# Patient Record
Sex: Male | Born: 1950 | Race: Black or African American | Hispanic: No | Smoking: Former smoker
Health system: Southern US, Community
[De-identification: ages and names within clinical notes are randomized; demographics above are authoritative.]

## PROBLEM LIST (undated history)

## (undated) DIAGNOSIS — E7800 Pure hypercholesterolemia, unspecified: Secondary | ICD-10-CM

## (undated) DIAGNOSIS — M1990 Unspecified osteoarthritis, unspecified site: Secondary | ICD-10-CM

## (undated) DIAGNOSIS — L409 Psoriasis, unspecified: Secondary | ICD-10-CM

## (undated) DIAGNOSIS — I10 Essential (primary) hypertension: Secondary | ICD-10-CM

## (undated) DIAGNOSIS — E119 Type 2 diabetes mellitus without complications: Secondary | ICD-10-CM

## (undated) DIAGNOSIS — K219 Gastro-esophageal reflux disease without esophagitis: Secondary | ICD-10-CM

## (undated) HISTORY — DX: Unspecified osteoarthritis, unspecified site: M19.90

## (undated) HISTORY — DX: Type 2 diabetes mellitus without complications: E11.9

## (undated) HISTORY — DX: Pure hypercholesterolemia, unspecified: E78.00

## (undated) HISTORY — PX: KNEE SURGERY: SHX244

---

## 2003-10-08 ENCOUNTER — Ambulatory Visit: Admission: RE | Admit: 2003-10-08 | Payer: Self-pay | Source: Ambulatory Visit | Admitting: Gastroenterology

## 2006-07-18 ENCOUNTER — Ambulatory Visit: Admit: 2006-07-18 | Disposition: A | Discharge status: Home / Self Care | Payer: Self-pay | Source: Ambulatory Visit

## 2006-09-27 ENCOUNTER — Ambulatory Visit: Admit: 2006-09-27 | Disposition: A | Discharge status: Home / Self Care | Payer: Self-pay | Source: Ambulatory Visit

## 2007-09-28 ENCOUNTER — Inpatient Hospital Stay
Admission: EM | Admit: 2007-09-28 | Disposition: A | Discharge status: Skilled Nursing Facility | Payer: Self-pay | Source: Emergency Department | Admitting: Pulmonary Disease

## 2007-09-29 LAB — PT AND APTT
PT INR: 1.2 {INR} — ABNORMAL HIGH (ref 0.9–1.1)
PT: 13.9 s — ABNORMAL HIGH (ref 10.8–13.3)
PTT: 27 s (ref 21–32)

## 2007-09-29 LAB — BASIC METABOLIC PANEL
BUN: 17 mg/dL (ref 8–20)
CO2: 24 mEq/L (ref 21–30)
Calcium: 9.3 mg/dL (ref 8.6–10.2)
Chloride: 102 mEq/L (ref 98–107)
Creatinine: 1.2 mg/dL (ref 0.6–1.5)
Glucose: 103 mg/dL — ABNORMAL HIGH (ref 70–100)
Potassium: 4 mEq/L (ref 3.6–5.0)
Sodium: 137 mEq/L (ref 136–146)

## 2007-09-29 LAB — CBC
Hematocrit: 36.7 % — ABNORMAL LOW (ref 42.0–52.0)
Hgb: 12.4 G/DL — ABNORMAL LOW (ref 13.0–17.0)
MCH: 30 PG (ref 28.0–32.0)
MCHC: 33.8 G/DL (ref 32.0–36.0)
MCV: 88.6 FL (ref 80.0–100.0)
MPV: 10.2 FL (ref 9.4–12.3)
Platelets: 238 /mm3 (ref 140–400)
RBC: 4.14 /mm3 — ABNORMAL LOW (ref 4.70–6.00)
RDW: 14.2 % (ref 11.5–15.0)
WBC: 9.23 /mm3 (ref 3.50–10.80)

## 2007-09-29 LAB — GFR

## 2007-09-29 LAB — MAGNESIUM: Magnesium: 1.7 mg/dL (ref 1.6–2.3)

## 2007-09-29 LAB — GLYCOHEMOGLOBIN A1C* CERNER: % Hgb A1C: 6.2 % — ABNORMAL HIGH (ref 4.8–6.0)

## 2007-09-30 LAB — CBC
Hematocrit: 35.9 % — ABNORMAL LOW (ref 42.0–52.0)
Hgb: 11.9 G/DL — ABNORMAL LOW (ref 13.0–17.0)
MCH: 30 PG (ref 28.0–32.0)
MCHC: 33.1 G/DL (ref 32.0–36.0)
MCV: 90.4 FL (ref 80.0–100.0)
MPV: 10.5 FL (ref 9.4–12.3)
Platelets: 236 /mm3 (ref 140–400)
RBC: 3.97 /mm3 — ABNORMAL LOW (ref 4.70–6.00)
RDW: 14.5 % (ref 11.5–15.0)
WBC: 14.16 /mm3 — ABNORMAL HIGH (ref 3.50–10.80)

## 2007-09-30 LAB — GFR

## 2007-09-30 LAB — MAGNESIUM: Magnesium: 1.7 mg/dL (ref 1.6–2.3)

## 2007-09-30 LAB — BASIC METABOLIC PANEL
BUN: 13 mg/dL (ref 8–20)
CO2: 25 mEq/L (ref 21–30)
Calcium: 8.5 mg/dL — ABNORMAL LOW (ref 8.6–10.2)
Chloride: 99 mEq/L (ref 98–107)
Creatinine: 1.2 mg/dL (ref 0.6–1.5)
Glucose: 104 mg/dL — ABNORMAL HIGH (ref 70–100)
Potassium: 4.2 mEq/L (ref 3.6–5.0)
Sodium: 137 mEq/L (ref 136–146)

## 2007-10-01 LAB — BASIC METABOLIC PANEL
BUN: 20 mg/dL (ref 8–20)
CO2: 27 mEq/L (ref 21–30)
Calcium: 8.4 mg/dL — ABNORMAL LOW (ref 8.6–10.2)
Chloride: 98 mEq/L (ref 98–107)
Creatinine: 1.5 mg/dL (ref 0.6–1.5)
Glucose: 92 mg/dL (ref 70–100)
Potassium: 4.5 mEq/L (ref 3.6–5.0)
Sodium: 134 mEq/L — ABNORMAL LOW (ref 136–146)

## 2007-10-01 LAB — CBC
Hematocrit: 32.7 % — ABNORMAL LOW (ref 42.0–52.0)
Hgb: 10.6 G/DL — ABNORMAL LOW (ref 13.0–17.0)
MCH: 29.7 PG (ref 28.0–32.0)
MCHC: 32.4 G/DL (ref 32.0–36.0)
MCV: 91.6 FL (ref 80.0–100.0)
MPV: 10.3 FL (ref 9.4–12.3)
Platelets: 218 /mm3 (ref 140–400)
RBC: 3.57 /mm3 — ABNORMAL LOW (ref 4.70–6.00)
RDW: 14.6 % (ref 11.5–15.0)
WBC: 11.22 /mm3 — ABNORMAL HIGH (ref 3.50–10.80)

## 2007-10-01 LAB — URINALYSIS
Bilirubin, UA: NEGATIVE
Glucose, UA: NEGATIVE
Ketones UA: NEGATIVE
Leukocyte Esterase, UA: NEGATIVE
Nitrite, UA: NEGATIVE
Protein, UR: NEGATIVE
Specific Gravity UA POCT: 1.019 (ref 1.001–1.035)
Urine pH: 5.5 (ref 5.0–8.0)
Urobilinogen, UA: 0.2

## 2007-10-01 LAB — GFR

## 2007-10-01 LAB — MAGNESIUM: Magnesium: 1.9 mg/dL (ref 1.6–2.3)

## 2007-10-02 LAB — BASIC METABOLIC PANEL
BUN: 16 mg/dL (ref 8–20)
CO2: 28 mEq/L (ref 21–30)
Calcium: 8.6 mg/dL (ref 8.6–10.2)
Chloride: 100 mEq/L (ref 98–107)
Creatinine: 1.3 mg/dL (ref 0.6–1.5)
Glucose: 97 mg/dL (ref 70–100)
Potassium: 4.2 mEq/L (ref 3.6–5.0)
Sodium: 137 mEq/L (ref 136–146)

## 2007-10-02 LAB — CBC
Hematocrit: 31.1 % — ABNORMAL LOW (ref 42.0–52.0)
Hgb: 10.3 G/DL — ABNORMAL LOW (ref 13.0–17.0)
MCH: 30 PG (ref 28.0–32.0)
MCHC: 33.1 G/DL (ref 32.0–36.0)
MCV: 90.7 FL (ref 80.0–100.0)
MPV: 10.6 FL (ref 9.4–12.3)
Platelets: 225 /mm3 (ref 140–400)
RBC: 3.43 /mm3 — ABNORMAL LOW (ref 4.70–6.00)
RDW: 14.6 % (ref 11.5–15.0)
WBC: 9.77 /mm3 (ref 3.50–10.80)

## 2007-10-02 LAB — MAGNESIUM: Magnesium: 2 mg/dL (ref 1.6–2.3)

## 2011-06-08 LAB — ECG 12-LEAD
Atrial Rate: 62 {beats}/min
P Axis: 35 degrees
P-R Interval: 194 ms
Q-T Interval: 418 ms
QRS Duration: 102 ms
QTC Calculation (Bezet): 424 ms
R Axis: -18 degrees
T Axis: -3 degrees
Ventricular Rate: 62 {beats}/min

## 2011-06-29 NOTE — Consults (Unsigned)
DATE OF BIRTH:                        [birth year]      ADMISSION DATE:                     09/28/2007            PATIENT LOCATION:                    [id number]            DATE OF CONSULTATION:                09/29/2007      CONSULTANT:                        Abe People, MD      CONSULTING SERVICE:            REASON FOR CONSULTATION:   Bilateral knee injury with a possible quadriceps      tendon tear.            HISTORY OF PRESENT ILLNESS:  The patient is a 60 year old black gentleman,      who has a history of hypertension and hypercholesterolemia.  The patient      was playing basketball yesterday.  The patient jumped and went down.  He      had no pain on landing, but when he started to run, he heard a large pop on      the left knee.  The left knee gave out.  He fell down.  He tried to use the      right side, and tried to put weight on the right side.  Again, he started      to have a lot of pain, and the right knee gave out again.  The patient was      brought to the emergency room in [facility].  With x-rays done,      the patient was admitted to the hospital for possible rupture of bilateral      quadriceps tendons.            I was consulted to see this patient for evaluation.  The patient complained      of pain on the left side much more than the right side.            PAST MEDICAL HISTORY:  His past medical history is significant for      hypertension, hypercholesterolemia, diabetes with non-insulin dependence,      benign prostatic hypertrophy, allergic rhinitis.            ALLERGIES:  The patient is not allergic to medications.            MEDICATIONS:  Aspirin; Benicar; Lipitor; Zetia; Uroxatral; Allegra.            FAMILY HISTORY:  Noncontributory.            SOCIAL HISTORY:  The patient denied any smoking.  He drinks occasionally.      He denied IV drug abuse.  The patient is living at home by himself.            REVIEW OF SYSTEMS:  Essentially unremarkable, except the bilateral knees       were painful and swollen.            PHYSICAL  EXAMINATION:  The patient is alert, oriented x3, and cooperative.      Vital signs stable.  No distress.  The left knee exam showed the patient      had swelling.  Unable to hold the knee straight with extension.  The      patient had a small palpable gap at the insertion of the quadriceps tendon      on the superior pole of the patella and severe pain with movement of the      knee.  The patient had some pain on palpation around the medial and lateral      knee, but grossly stable.  Neurovascularly intact.  Exam of the right knee      showed the patient has mild swelling compared to the other side.  Again,      the patient had a small palpable gap on the suprapatellar area with unable      to hold the knee straight actively.  Painful palpation at the quadriceps      tendon insertion, neurovascularly intact.  The rest of the examination was      essentially unremarkable.  No calf pain.            LABORATORY DATA:  X-ray of the left knee showed the patient had a small      avulsion fracture of the superior pole of the patella.  No obvious other      fractures.  X-ray of the right essentially negative.            IMPRESSION:  Quadriceps tendon tear of bilateral knees, possibly complete      on the left side and partial on the right side.            RECOMMENDATIONS:  The patient had the MRI today, and I recommend the      patient to have the surgery of the repair of the quadriceps tendon tear of      the bilateral knees.  The risks and benefits of the operation were      explained to the patient.  The patient agreed to have the surgery.                                                ___________________________________          Date Signed: __________      Abe People, MD  ([id number])            D: 09/29/2007 by Abe People, MD      T: 09/29/2007 by [id number] ([id number]) Dorris Carnes: [id number])      cc:  Abe People, MD

## 2011-06-29 NOTE — Discharge Summary (Signed)
DATE OF BIRTH:                        [birth year]            ADMISSION DATE:                     09/28/2007      DISCHARGE DATE:                     10/02/2007            ATTENDING PHYSICIAN:                  Tammi Klippel, MD            HISTORY OF PRESENT ILLNESS:  This is a 60 year old man with past medical      history of hypertension, diabetes, status post fall.  Had an injury of      bilateral knees while playing basketball and sustained a rupture of      bilateral quadriceps tendon with a patellar fracture and got admitted on      09/28/2007.            HOSPITAL COURSE:  Patient was seen by the orthopedic on 09/29/2007 and      underwent surgery on 09/29/2007 with quadriceps tendon repair of the      bilateral knees.  Patient was getting medication for the pain and Lovenox      for prophylaxis and continued all the home medication.  Patient continued      to improve with the pain, and PCA pump was stopped on 10/02/2007, and      Percocet p.r.n. for the pain.  Patient was consulted by physical      therapy/occupational therapy, and plan was to discharge the patient to      acute rehabilitation on 10/02/2007.  Patient was discharged to the      rehabilitation.            DISCHARGE MEDICATIONS:  Plan was to continue Lovenox 40 mg subcutaneously      daily for two weeks and then take aspirin 81 mg once a day.  Plan was to      transfer to the rehabilitation with incentive spirometer.  Norvasc 2.5 mg      q.12 h., Lipitor 20 mg nightly, Zetia 10 mg nightly, Zyrtec 10 mg every      day, Colace p.r.n. for the stool softener, folate 1 mg once a day, Cozaar      25 mg once a day, Januvia 50 mg once a day, Flomax 0.4 mg once a day,      Restoril 15 mg nightly, Percocet p.r.n. for the pain.            DISPOSITION:  Plan was for subacute rehabilitation on 10/02/2007.                                          Electronic Signing MD: Tammi Klippel, MD  ([id number])            D: 10/21/2007 by Tammi Klippel, MD       T: 10/22/2007 by [id number] ([id number]) (Dorris Carnes: [id number])      cc:  Tammi Klippel, MD

## 2011-06-29 NOTE — H&P (Unsigned)
DATE OF BIRTH:                        June 08, 1951      ADMISSION DATE:                     09/28/2007            PATIENT LOCATION:                    [id number] 6[address] [id number]            ATTENDING PHYSICIAN:                  Susanne Borders, MD            CHIEF COMPLAINT:      1.   Pain in the legs.      2.   Multiple trauma.            HISTORY OF PRESENT ILLNESS:  This is a 60 year old African American male      with a history of hypertension.  She basically comes here with a history of      fall while he was playing the basketball.  He jumped and went up for a      jump, and had no pain in landing, but noted pain in the left knee.  Shortly      afterwards, he put his pressure on the right knee, and then again he      started to have pain.  That is the reason why he is here for further      evaluation and management.  The patient denies any history of chest pain,      shortness of breath.            No chills.  No rigors.  No palpitations.            PAST MEDICAL HISTORY:      1.   Hypertension.      2.   Hypercholesterolemia.      3.   Diabetes mellitus.      4.   Benign prostatic hypertrophy.      5.   Allergic rhinitis.            ALLERGIES:  None.            MEDICATIONS:      1.   Aspirin 1 tablet p.o. daily.      2.   Benicar 1 tablet p.o. daily.      3.   Lipitor 1 tablet p.o. daily.      4.   Zetia 1 tablet p.o. daily.      5.   _____ 1 tablet p.o. daily.      6.   Uroxatrol 1 tablet p.o. daily.      7.   Allegra 1 tablet p.o. q.12 h.            FAMILY HISTORY:  Noncontributory.            SOCIAL HISTORY:  The patient does not smoke.  He drinks about 2, 3, or 4      drinks per day.  No history of IV drug abuse.            REVIEW OF SYSTEMS:  HEENT:  No history of headache.  No history of ear,      nose, or throat problem.  No history  of thyroid problem.  Heart:  No      history of chest pain or heart failure.  No MI.  Lungs:  He used to smoke.      He stopped smoking for the last 10 years.  He does not smoke  anymore.      Endocrine:  History of diabetes and hypertension and hypercholesterolemia,      for which he is taking medication.  Neurologic:  No history of stroke.      Genitourinary:  The patient has a history of difficulty in passing urine.      Sometimes he uses Uroxatral so he can make it better.            PHYSICAL EXAMINATION:  General Appearance:  This is a 60 year old male not      in acute respiratory distress, cooperative with physical examination.      Vital Signs:  Temperature 96.8, pulse of 62 per minute, respiratory rate of      18 per minute, blood pressure of 157/89.  HEENT:  Normocephalic,      atraumatic.  Pupils are equal and reactive to light and accommodation.      Heart:  S1 and S2 normal.  Regular rate and rhythm.  No murmurs.  No      bruits.  Lungs:  Bilaterally good breath sounds.  No rales.  No rhonchi.      No wheezing.  No egophony.   Abdomen:  Soft, nontender.  Bowel sounds are      present.  Musculoskeletal:  Significant swelling of both knees, but      basically the patella has moved upwards with significant swelling and      significant tenderness there.  There is a significant limitation in the      movement of the patella in the knee joint.            INVESTIGATIONS:  No investigations when he presented to the emergency room.      They did no investigations.            X-ray of the knee shows findings consistent with avulsion fracture from the      superior pole each patella, probably involving calcifications at the distal      quadriceps tendon.            ASSESSMENT:      1.   Fall, injury.      2.   Patellar avulsion fracture.      3.   Hypertension.      4.   Diabetes mellitus.      5.   History of allergic rhinitis.      6.   Allergy to none.      7.   Hyperlipidemia.      8. Benign prostatic hypertrophy.      MANAGEMENT:      1.   Will request Dr. Laveda Norman to see the patient, who is an orthopedic      surgeon.      2.   Will also request EKG with 12 leads to make sure he is  okay.      3.   Will request Dr. Westley Chandler to see the patient.      4.   Make sure neurologic checks are done q.8 h.  Watch for the tremors.      5.   Start the patient on Januvia.      6.   Hold  Januvia in the morning.      7.   Allegra 60 mg p.o. q.8 h.      8.   EKG with 12 leads.      9.   _____ on a p.r.n. basis.      10.  Tylenol p.r.n.      11.  Folic acid 1 tablet p.o. daily.      12.  Thiamine 100 mg IV daily.      13.  Restoril 15 mg p.o. daily.      14.  Norvasc and Benicar to continue.      15.  Zetia and Lipitor to continue.      16.  Nothing by mouth at midnight.      17.  Will get CBC, electrolytes, and repeat INR again.      18.  Will follow the patient closely.                              ___________________________________          Date Signed: __________      Susanne Borders, MD  ([id number])            D: 09/28/2007 by Susanne Borders, MD      T: 09/28/2007 by [id number] ([id number]) (N: [id number])      cc:  Susanne Borders, MD

## 2011-06-29 NOTE — Consults (Signed)
DATE OF BIRTH:                        [birth year]      ADMISSION DATE:                     09/28/2007            PATIENT LOCATION:                    [facility] 678 01            DATE OF CONSULTATION:                09/29/2007      CONSULTANT:                        Annetta Maw, MD      CONSULTING SERVICE:                  CARDIOLOGY            REFERRING PHYSICIAN:                  [place] Crews, MD            REASON FOR CONSULTATION:    Preoperative evaluation.            HISTORY OF PRESENT ILLNESS:    The patient is a 60 year old gentleman who      comes to the hospital with bilateral knee pain.  He says that he was      playing basketball when he suddenly felt a popping sensation in the left      knee and subsequently the same symptoms in the right knee, after he shifted      his weight to the right.  He is now being evaluated by Cardiology for      cardiac clearance prior to the surgery.  He says that he is fairly active      and plays single tennis regularly without experiencing any symptoms.      Furthermore he plays basketball and chases the ball without experiencing      any chest pain or shortness of breath.  He played last yesterday when his      knee gave away during the game.  Currently he is asymptomatic and feels      fine.  There is no history of chest pain, no shortness of breath, no      palpitation, lightheadedness, dizziness or syncope.  Normally he is able to      walk up two flights of stairs carrying the groceries without any      difficulty.            PAST MEDICAL HISTORY:    Significant for hypertension, noninsulin dependent      diabetes, hyperlipidemia.            PAST SURGICAL HISTORY:    Significant for open reduction and fixation of      the left foot.            HOME MEDICATIONS:  Januvia, Lipitor, Zetia, Benicar, Norvasc, tetracycline,      aspirin and prostate medication.            ALLERGIES:    No known drug allergies.            SOCIAL HISTORY:    He used to smoke about a  pack per day for more than 30      years but stopped 8 years ago.  He drinks a couple of beers every other day      or so.  He lives at home.            FAMILY HISTORY:    There is no history of coronary artery disease or sudden      cardiac death in the first degree  family members.            REVIEW OF SYSTEMS:    There is no history of congestion, runny nose or      eyes, no fever or chills, no cough, no skin rash.  Otherwise as mentioned      above.            PHYSICAL EXAMINATION:    He is awake, alert, oriented, in no apparent      distress.  His blood pressure today is 127/70, heart rate is 60 bpm,      respirations 18 and he is afebrile.  Head and neck examination:  No jugular      venous distention, no jaundice, no lymphadenopathy, no bruits over      carotids, moist mucous membranes.  Cardiovascular examination is intact      first and second heart sounds, no third or fourth heart sound, no murmurs,      rubs or heaves.  Pulmonary examination reveals normal breath sounds with no      wheeze, crackle or rhonchi.  The abdomen is soft, nontender, with no      organomegaly.  Lower extremities with no edema, with 2+ pulses.            LAB DATA:    Normal CBC and chemistry panel.  His glucose is mildly      elevated at 102.  PT/PTT are within normal limits.  His electrocardiogram      shows sinus rhythm at a rate of 62 bpm, with normal QRS morphology and no      significant CT abnormality.            ASSESSMENT AND PLAN:    Mr. Beckel is a 60 year old gentleman with past      medical history as mentioned above, who is admitted to the hospital for      knee surgery.  He is fairly active and exercises regularly without any      symptoms.  His activity level is clearly more than 4 mets.  His      electrocardiogram is also normal.  Therefore I find him to be at low risk      from a cardiac point of view to undergo his knee surgery.            I would like to thank Dr. Valentina Shaggy for allowing me to participate in the       evaluation and care of Mr. Vanwagoner.                        Electronic Signing MD: Annetta Maw, MD  ([id number])            D: 09/29/2007 by Annetta Maw, MD      T: 09/29/2007 by Dot Been ([id number]) (N: [id number])      cc:  Susanne Borders, MD          Annetta Maw, MD

## 2011-06-29 NOTE — Op Note (Unsigned)
DATE OF BIRTH:                        06/22/1951      ADMISSION DATE:                     09/28/2007            PATIENT LOCATION:                    [facility] 680 01            DATE OF PROCEDURE:                   09/29/2007      SURGEON:                            Abe People, MD      ASSISTANT(S):                         Henrine Screws                  PREOPERATIVE DIAGNOSIS:                 RUPTURE OF THE QUADRICEPS                                              TENDON,BILATERAL KNEES.            POSTOPERATIVE DIAGNOSIS:                RUPTURE OF THE QUADRICEPS TENDON,                                              BILATERAL KNEES.            OPERATIVE PROCEDURE:                    OPEN REPAIR OF THE RUPTURED                                              QUADRICEPS TENDON, BILATERAL KNEES.                  ANESTHESIA:                             General endotracheal tube                                              anesthesia.            ESTIMATED BLOOD LOSS:                   Minimal.            TOURNIQUET TIME:  Tourniquet time on the left side is                                              54 minutes, on the right side 45                                              minutes.            INDICATION:  The patient is a  60 year old black gentleman with injury of      the bilateral knees when he was playing basketball yesterday sustaining      rupture of the bilateral quadriceps tendon which is confirmed with MRI      (magnetic resonance imaging) scan.  The patient was recommended to have the      surgery as mentioned above.            DESCRIPTION OF PROCEDURE:   After preoperative consent was obtained, the      patient was brought to the operating room and placed in position.  The      patient received 2 g Ancef for infection prophylaxis.  The bilateral knees      were then prepped and draped in the usual sterile fashion.  We did the left      knee first.  The tourniquet was inflated to 350 mmHg on the left  thigh.      The incision about 8 cm was made on the anterior aspect of the knee right      to the distal aspect of the quadriceps and the patella bone.  The incision      was then carried down through the subcutaneous tissues.  The hematoma was      evacuated.  The patient had completely torn off the quadriceps tendon.  I      washed the wound carefully and then I used #5 Ethibond to repair the      quadriceps and then with that formed the distal aspect of the quadriceps      tendon in the midline and then going proximal another 3 cm and then going      back to the distal aspect.  We used the Krackow suture and locking suture      of #5 Ethibond.  We did the same thing on both mediolateral sides with four      strings total.  We then drilled three holes on the superior to inferior      pole of the patella and sutured with #5 Ethibond, taken through the hole,      and then tied distally.  The knot was tied when the knee was held in      extension at 0 degrees.  I also used #5 Ethibond as well as #2 Ethibond to      close both medial and lateral retinaculum.  The patient had excellent      repair.  We washed the wound carefully.  We closed the wound by using #2      Monocryl and a stapler.  Hemostasis was obtained after the release the      tourniquet showing 54 minutes.  We placed a dressing.  I injected the  patient with 10 cc Marcaine along the skin incision and 10 cc of Marcaine      inside the knee joint on the left side.            Attention was now directed to the right side.  The incision was made about      8 cm on the midline of the knee to the distal quadriceps going to the      anterior patella.  The incision was then carried to the soft tissue      exposing the quadriceps tendon and the patella.  Again, we used #5 Ethibond      to suture.  We started from the distal aspect and went to proximal and went      back to distally.  We used a Krackow suture with locking suture and we had      four strands  coming out from the distal end of the quadriceps tendon.  We      then drilled three holes, one in the middle and one for each mediolateral      side of the superior pole patella to the inferior pole.  The sutures were      then pulled out from proximal to distal aspect of the patella and were tied      up as the left side.  Then we used #5 Ethibond as well as #2 Ethibond to      close both medial and lateral retinaculum.  The tourniquet was released at      45 minutes on the right side.  Hemostasis was obtained.  I closed the wound      by using 3-0 Monocryl and stapler.  Dry dressing was put in place.  I put      the patient on the knee immobilizer with knee placed and locked at 0      degrees extension.  I injected the patient with 10 cc of Marcaine 0.5%      along the skin incision and 10 cc in the knee joint.  The patient was      extubated an awakened and transferred to the post anesthesia care unit      without complication.                        ___________________________________          Date Signed: __________      Abe People, MD  ([id number])            D: 09/29/2007 by Abe People, MD      T: 09/30/2007 by Jonathon Bellows ([id number]) ([id number])      cc:  Abe People, MD

## 2012-07-29 ENCOUNTER — Ambulatory Visit: Payer: Self-pay | Admitting: Rehabilitative and Restorative Service Providers"

## 2012-07-31 ENCOUNTER — Ambulatory Visit: Payer: Self-pay | Admitting: Rehabilitative and Restorative Service Providers"

## 2012-08-04 ENCOUNTER — Inpatient Hospital Stay: Payer: 59 | Attending: Orthopaedic Surgery | Admitting: Rehabilitative and Restorative Service Providers"

## 2012-08-04 ENCOUNTER — Encounter: Payer: Self-pay | Admitting: Rehabilitative and Restorative Service Providers"

## 2012-08-04 VITALS — BP 128/66 | HR 66

## 2012-08-04 DIAGNOSIS — M47817 Spondylosis without myelopathy or radiculopathy, lumbosacral region: Secondary | ICD-10-CM | POA: Insufficient documentation

## 2012-08-04 NOTE — PT/OT Therapy Note (Signed)
INITIAL EVALUATION (Lumbar)      Name: Neil Frederick Age: 61 y.o. Occupation: desk job SOC: 08/04/2012  Referring Physician: Abe People, MD MD recheck-:  DOS: - DOI: Onset of Problem / Injury: 06/30/12  Diagnosis (Treating/Medical): The encounter diagnosis was Lumbosacral spondylosis without myelopathy.   # of Authorized Visits:  5 Visit #  1 today     SUBJECTIVE:    Mechanism of Injury: 5 weeks a go did  A lot of lifting and played basket ball , the day after pain started in the R leg, and also had tingling in his leg that turned into numbness, went to doctor who ordered MRI and ordered medication, pain went away 3 weeks  Ago,  but still has numbness in the R leg all the way down to 4 th and 5th toes, MRI showed spur formation at L5      Patient's reason for seeking PT /Goal: decreasing numbness    Past Medical History:   Past Medical History   Diagnosis Date   . Diabetes mellitus without complication      Medications: No outpatient prescriptions have been marked as taking for the 08/04/12 encounter (Clinical Support) with Derrek Gu, PT.   Fall Risks: Low    Other Treatment/Prior Therapy: Yes for knees and shoulder  Prior Hospitalization:   Hand Dominance: Dominant Hand: Right Involved Side: Involved Side: Right   DiagnosticTests: MRI L bone spure in L5    Outcome Measure:         Oswestry Score: 0          % PSFS Score: 0 % Rate Satisfaction with Current Function: 7/10  PLOF: no limitation in any of the movements  Living Environment: Type of Residence: Multi-story home      Dwelling Entrance: # Steps to Enter: 26    Patient lives with: Living Arrangements: Alone    OBJECTIVE:    Vitals: BP: 128/66 mmHg Heart Rate: 66         Observation/Posture/Gait/Integumentary  Observation:   Posture: Deficits noted: Forward Head, Pes Planus bilateral and Other: flexed knees  Gait: Other: lateral body shifting, decrease transverse motion of the pelvis  Integumentary: No wound, lesion or rash noted     AROM Hip  WFL    AROM: Lumbar Spine Pain  R L   Flexion 25    Flexion     Extension 5    Extension      R L R L Abduction     Side Bending 10 15   Exter. Rot.     Rotation     Inter. Rot.     (blank fields were intentionally left blank)    Repeated flexion/extension centralizes symptoms? NT    STRENGTH   Lumbar   MMT /5    IE R IE L R L  IE R IE  L R  L   L1/2 Hip Flex 4+ 4   Glute Medius 4+ 4+     L3 Knee Ext 5 5    T.A. Activation fair fair     L4 Ankle DF 5 5   Heel/Toe Walk       L5 Toe Ex 4 4   multifidus fair fair          Hip IR 4+ 4+          Hip ER 4+ 4+     S1 Knee Flex 5 5   Hip extension 4 4     (  blank fields were intentionally left blank)    Palpation: No pain to palpation    Joint Mobility Assessment: hypomobile  End feel: Firm    Special Tests/Neurological Screen:   R L   Deep Tendon Reflexes: Patella 2+ = Normal 2+ = Normal    Achilles 1+ = Trace 2+ = Normal    Post Tib NT NT     SI Special Tests:    R L   Compression (-) (-)   Distraction (-) (-)   March (+) (-)   Sacral thrust NT NT   Sitting PSIS heights NT NT   Standing Flexion NT NT   Supine long sit NT NT   Thigh thrust NT NT           R L   SLR (-) (-)   Crossed SLR NT NT   Quadrant Test NT NT   Thomas Test NT NT   OBER Test (+) (+)   Slump (-) (-)   FABER (-) (-)   SAG Sign NT NT   Hamstring Flexibility 50 50   Piriformis flexibility 15 20   Iliopsoas flexibility 10 10               Treatment Initial Visit:  Evaluation  Patient Education on plan of treatment  Therapeutic exercise with instruction in HEP and provided patient written and illustrated handout No  Manual   Modalities: None  Barriers to rehabilitation: None  Rehab Potential:good  Is patient aware of diagnosis: Yes  For Next Visit Add     Derrek Gu, PT, [place] [address]  08/04/2012  Time In/Out:  5:50 - 6:30   Total Treatment Time:  40 min  Plan of Care / Updated Plan of Care IPTC Medicare Provider #: [phone]                    Patient Name: Neil Frederick  MRN: [id number]  DOI: Onset of Problem /  Injury: 06/30/12 DOS: N/A SOC: 08/04/2012         Diagnosis: The encounter diagnosis was Lumbosacral spondylosis without myelopathy.  ICD-9 code: 721.3    ASSESSMENT: the patient is a 61 y.o. male who requires Physical Therapy for the following:  Impairments: weakness of core and hip muscles, decreased mobility of the lumbar spine and thoracic spine, decreased ROM of the lumbar spine, tightness of the LE muscles    Functional Limitations:  Pt has no functional limitaion    Plan Of Care: Body Mechanics Education, NMR, Proprioceptive Activites, Instruction in HEP, Traction, Therapeutic Exercise and Soft Tissue/Joint Mobilization grade lll to V to LS    Frequency/Duration: 1 times a week for 5 weeks. Anticipated D/C date: 09/08/12    Goals:  Date (Body Area, Impairment Goal, Functional   Activity, Target Performance) Time Frame Status Date/  Initial   08/04/2012   Pt to show good mechanics in doing his HEP  5 weeks  Initial Eval                                  Signature: Derrek Gu, PT, [place] [address] Date: 08/04/2012    Signature: Abe People, MD ____________________________ Date:

## 2012-08-04 NOTE — PT/OT Exercise Plan (Signed)
Name: Neil Frederick  Referring Physician: Abe People, MD  Diagnosis:   Encounter Diagnosis:numbness radiating to R leg   Name Primary?   . Lumbosacral spondylosis without myelopathy Yes        Precautions: knee surgery Date of Surgery:  - MD Follow-up: -   ICD-9 : 721.3          Exercise Flow Sheet    Exercise Specifics  Date            TM                  bridging                     clams With TB              Prone push ups                 Prone alternate arm/leg                 Self traction                 modified side plank                 PD march                                                                    Home Exercise Program                 (Initials = supervised exercise by clinician)

## 2012-08-08 ENCOUNTER — Ambulatory Visit: Payer: Self-pay | Admitting: Rehabilitative and Restorative Service Providers"

## 2012-08-11 ENCOUNTER — Ambulatory Visit: Payer: 59

## 2012-08-14 ENCOUNTER — Inpatient Hospital Stay: Payer: 59 | Attending: Orthopaedic Surgery | Admitting: Rehabilitative and Restorative Service Providers"

## 2012-08-14 DIAGNOSIS — M47817 Spondylosis without myelopathy or radiculopathy, lumbosacral region: Secondary | ICD-10-CM | POA: Insufficient documentation

## 2012-08-14 NOTE — PT/OT Therapy Note (Signed)
DAILY NOTE    Time In/Out: 5:48 - 6:30  Total Time: 42 min Visit Number:  2    # of Authorized Visits: 5 Visit #: 2    Diagnosis (Treating/Medical): The encounter diagnosis was Lumbosacral spondylosis without myelopathy.          Subjective:    Functional Changes: pt reports that still has numbness in his R leg but better than before, pt has done his normal activities of daily living without problem    Objective:   Treatment:  Therapeutic Exercise:Per flowsheet to improve: Stabilization and Strength   Assisted Warm-up subjective taken  Modifications/Patient Education:  (Verbal, Visual and Tactile cues ) starting new exercises, added HEP    Functional Activities: identifying of core muscles and how to contract them individually , setting core muscles before different activities    Manual Therapy:   none      Current Measurements (ROM, Strength, Girth, Outcomes, etc.):      No measurement ( 2nd session)    Modalities: None  Therapy Rationale: Other:   Other:        Assessment (response to treatment):   Pt has a challenge to relax his back muscles in prone push ups, had no increase in his symptoms with exercises, shows considerable body shifting in pull down march    Progress towards functional goals: -    Patient requires continued skilled care to: shows consistency in HEP    Plan:  Adjustments/Modifications: if pt shows with less symptoms to be discharged on session 3, mechanics of lifting and sit to stand to be discussed next session    Derrek Gu, PT, [place] [address]  08/14/2012  Goals:   Date  (Body Area, Impairment Goal, Functional   Activity, Target Performance)  Time Frame  Status  Date/   Initial    08/04/2012  Pt to show good mechanics in doing his HEP  5 weeks  Initial Eval

## 2012-08-14 NOTE — PT/OT Exercise Plan (Signed)
Name: Neil Frederick  Referring Physician: Abe People, MD  Diagnosis:   Encounter Diagnosis:numbness radiating to R leg   Name Primary?   . Lumbosacral spondylosis without myelopathy Yes        Precautions: knee surgery Date of Surgery:  - MD Follow-up: -   ICD-9 : 721.3          Exercise Flow Sheet    Exercise Specifics 08/14/12 Date            TM    6' 2.0  AZ              bridging      2 x 10  AZ               clams With TB 2 x 10 RTB  AZ             Prone push ups    10 x 10"  AZ             Prone alternate arm/leg    X 10 B  AZ             Self traction    With half roll foam 3 x 30"             modified side plank    1x 10 B  AZ             PD march    2 min RTB  AZ                                                                Home Exercise Program                 (Initials = supervised exercise by clinician)

## 2012-08-15 ENCOUNTER — Inpatient Hospital Stay: Payer: 59 | Attending: Orthopaedic Surgery | Admitting: Rehabilitative and Restorative Service Providers"

## 2012-08-15 DIAGNOSIS — M47817 Spondylosis without myelopathy or radiculopathy, lumbosacral region: Secondary | ICD-10-CM | POA: Insufficient documentation

## 2012-08-15 NOTE — PT/OT Exercise Plan (Signed)
Name: Dorrene German  Referring Physician: Abe People, MD  Diagnosis:   Encounter Diagnosis:numbness radiating to R leg   Name Primary?   . Lumbosacral spondylosis without myelopathy Yes        Precautions: knee surgery Date of Surgery:  - MD Follow-up: -   ICD-9 : 721.3          Exercise Flow Sheet    Exercise Specifics 08/14/12 08/15/12            TM    6' 2.0  AZ 4'  1.45mph  ktw             bridging      2 x 10  AZ 3x10  With vmo  ktw              clams With TB 2 x 10 RTB  AZ RTB  3x10  ktw            Prone push ups    10 x 10"  AZ 10x10"  ktw            Prone alternate arm/leg    X 10 B  AZ x15  ktw            Self traction    With half roll foam 3 x 30" time            modified side plank    1x 10 B  AZ x15  2  Sec hold B  ktw            PD march    2 min RTB  AZ RTB  2'  ktw                                                               Home Exercise Program     understands            (Initials = supervised exercise by clinician)

## 2012-08-15 NOTE — PT/OT Therapy Note (Signed)
DAILY NOTE    Time In/Out: 2:45 - 3:30  Total Time: 45 min Visit Number:  3    # of Authorized Visits: 5 Visit #: 3    Diagnosis (Treating/Medical): There were no encounter diagnoses.          Subjective:  Reports right LE numbness from lateral thigh to outer foot from 90% to 1% since starting anti-inflammatory and PT treatments. Encouraged with ability to play tennis or basketball 3x/wk without aggravation.  Functional Changes: pt reports that still has numbness in his R leg but better than before, pt has done his normal activities of daily living without problem    Objective:   Treatment:  Therapeutic Exercise:Per flowsheet to improve: Stabilization and Strength   Assisted Warm-up TM with history taken.  Modifications/Patient Education:  (Verbal, Visual and Tactile cues ) Body mechanics as below  Increased reps with all core lumbar exercises.  Functional Activities: Body mechanics with lifting activities and sit to stand transfers.Lifting bin from floor x 10 with visual cues to widen base of support.    Manual Therapy:   Defer      Current Measurements (ROM, Strength, Girth, Outcomes, etc.):          Modalities: None Patient declined.  Therapy Rationale: Other:   Other:        Assessment (response to treatment):   Tactile cues to maintain core contraction with side planks and bridges. Rigid lumbar spine with press ups until verbal cues to relax. Minimal right L5 numbness this week.    Progress towards functional goals: -    Patient requires continued skilled care GE:[id number] HEP safely to strengthen core.    Plan:  Advance HEP with planks and TBand core exercises. Oswestry and plan D/C next treatment if continued improvement.    Edison Nasuti PT CLT [phone]  08/15/2012  Goals:   Date  (Body Area, Impairment Goal, Functional   Activity, Target Performance)  Time Frame  Status  Date/   Initial    08/04/2012  Pt to show good mechanics in doing his HEP  5 weeks  Initial Eval

## 2012-08-18 ENCOUNTER — Ambulatory Visit: Payer: 59

## 2012-08-21 ENCOUNTER — Inpatient Hospital Stay: Payer: 59 | Attending: Orthopaedic Surgery | Admitting: Rehabilitative and Restorative Service Providers"

## 2012-08-21 DIAGNOSIS — M47817 Spondylosis without myelopathy or radiculopathy, lumbosacral region: Secondary | ICD-10-CM | POA: Insufficient documentation

## 2012-08-21 NOTE — PT/OT Therapy Note (Signed)
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      Subjective:  Pt reports that does not have the tingling anymore in his leg but numbness down to foot is still present.  Is concerned that still has numbness in leg.  Had some questions regarding HEP.    Objective:   Treatment:    Therapeutic Exercise:Per flowsheet to improve: Stabilization and Strength   Assisted Warm-up TM backwards with History Taken  Modifications/Patient Education:  (Verbal and Tactile cues): ed pt on how to advance exercises at home    Manual Therapy:   Defer      Current Measurements (ROM, Strength, Girth, Outcomes, etc.):   Hamstring flexibility: 90 deg B    Modalities: None Patient declined.  Therapy Rationale: Other:   Other:      Assessment (response to treatment):   Pt with improved hamstring flexibility.  Altered exercises as noted in scanned document as pt reported had some pain in shoulder during side planks.  Pt reported felt good fatigue with exercises involved thera-band.    Progress towards functional goals: -    Patient requires continued skilled care to: review HEP for safety    Plan:  D/C next visit with HEP review (pt aware).  Pt to return Oswestry.      Ulysees Barns, PT, DPT [place] [phone]   08/21/2012  Goals:   Date  (Body Area, Impairment Goal, Functional   Activity, Target Performance)  Time Frame  Status  Date/   Initial    08/04/2012  Pt to show good mechanics in doing his HEP  5 weeks

## 2012-08-21 NOTE — PT/OT Exercise Plan (Signed)
Name: Neil Frederick  Referring Physician: Abe People, MD  Diagnosis:   Encounter Diagnosis:numbness radiating to R leg   Name Primary?   . Lumbosacral spondylosis without myelopathy Yes      Precautions: knee surgery Date of Surgery:  - MD Follow-up: -   ICD-9 : 721.3        Exercise Flow Sheet    Exercise Specifics 08/14/12 08/15/12 12/12           TM    6' 2.0  AZ 4'  1.56mph  ktw 4' backwards  NU            bridging      2 x 10  AZ 3x10  With vmo  ktw GTB with abd  x20  NU             clams With TB 2 x 10 RTB  AZ RTB  3x10  ktw GTB x 20  NU           Prone push ups    10 x 10"  AZ 10x10"  ktw ->           Prone alternate arm/leg    X 10 B  AZ x15  ktw Standing at wall lumbar extension  5x5"  NU           Self traction    With half roll foam 3 x 30" time ->           modified side plank    1x 10 B  AZ x15  2  Sec hold B  ktw On wall  10" ea  NU           PD march    2 min RTB  AZ RTB  2'  ktw RTB  2'  NU                 Sidesteps GTB  1/2 hall x 1  NU                                             Home Exercise Program     understands Reviewed  And made alterations  NU           (Initials = supervised exercise by clinician)

## 2012-08-25 ENCOUNTER — Ambulatory Visit: Payer: 59 | Admitting: Rehabilitative and Restorative Service Providers"

## 2012-08-28 ENCOUNTER — Inpatient Hospital Stay: Payer: 59 | Attending: Orthopaedic Surgery | Admitting: Rehabilitative and Restorative Service Providers"

## 2012-08-28 DIAGNOSIS — M47817 Spondylosis without myelopathy or radiculopathy, lumbosacral region: Secondary | ICD-10-CM | POA: Insufficient documentation

## 2012-08-28 NOTE — PT/OT Therapy Note (Signed)
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      Subjective:  Pt reports that his numbness is almost gone, he reports that is 999.99 percent gone,. Pt had shoulder pain with plank exercises and could not do that, pt does all his regular activities of the life with no problem  Objective:   Treatment:    Therapeutic Exercise:Per flowsheet to improve: Stabilization and Strength   Assisted Warm-up TM backwards with History Taken  Modifications/Patient Education:  (Verbal and Tactile cues): reviewed HEP, modifying plank exercise to be done on the wall    Manual Therapy:   none      Current Measurements (ROM, Strength, Girth, Outcomes, etc.):   Oswewstry : 0% ( IE:0%)  Pain scale: 0%   (IE: 0%)  Satisfaction :10/10 ( IE:7/10)    Modalities: none  Therapy Rationale:  Other:      Assessment (response to treatment):   Pt has pain with planks, modified planks on the wall preformed and was added to HEP, pt has no pain with the exercises, prone alternate leg and arm is challenging. Pt has MET his goal and is discharged with HEP  Progress towards functional goals: pt has MET the goal    Patient requires continued skilled care to:-    Plan:  Pt is discharged with HEP     Derrek Gu, PT, [phone]   08/28/2012  Goals:   Date  (Body Area, Impairment Goal, Functional   Activity, Target Performance)  Time Frame  Status  Date/   Initial    08/04/2012  Pt to show good mechanics in doing his HEP  5 weeks    MET  08/28/12  AZ

## 2012-08-28 NOTE — PT/OT Exercise Plan (Signed)
Name: Dorrene German  Referring Physician: Abe People, MD  Diagnosis:   Encounter Diagnosis:numbness radiating to R leg   Name Primary?   . Lumbosacral spondylosis without myelopathy Yes      Precautions: knee surgery Date of Surgery:  - MD Follow-up: -   ICD-9 : 721.3        Exercise Flow Sheet    Exercise Specifics 08/14/12 08/15/12 12/12 08/28/12          TM    6' 2.0  AZ 4'  1.37mph  ktw 4' backwards  NU 6' 2.5  AZ              bridging      2 x 10  AZ 3x10  With vmo  ktw GTB with abd  x20  NU Bridging  1 x 10            clams With TB 2 x 10 RTB  AZ RTB  3x10  ktw GTB x 20  NU Clams GTB  X 10  B  AZ          Prone push ups    10 x 10"  AZ 10x10"  ktw -> Prone push ups   10 x 10"  AZ          Prone alternate arm/leg    X 10 B  AZ x15  ktw Standing at wall lumbar extension  5x5"  NU Prone alternate arm/leg  1 x 10  AZ          Self traction    With half roll foam 3 x 30" time ->           modified side plank    1x 10 B  AZ x15  2  Sec hold B  ktw On wall  10" ea  NU Planks on the wall  X 10 B          PD march    2 min RTB  AZ RTB  2'  ktw RTB  2'  NU -                Sidesteps GTB  1/2 hall x 1  NU -                                            Home Exercise Program     understands Reviewed  And made alterations  NU           (Initials = supervised exercise by clinician)

## 2012-09-11 NOTE — Op Note (Unsigned)
DATE OF BIRTH:                        December 06, 1950      ADMISSION DATE:                     10/08/2003            PATIENT LOCATION:                     END END 30            DATE OF PROCEDURE:                   10/08/2003      SURGEON:                            Wyvonnia Lora, MD      ASSISTANT(S):                  PREOPERATIVE DIAGNOSIS:  COLON CANCER SCREENING.            POSTOPERATIVE DIAGNOSIS:  HEMORRHOIDS, OTHERWISE NORMAL COLON.            PROCEDURE:  COLONOSCOPY.            DESCRIPTION OF PROCEDURE:  The patient underwent a standard prep with      Fleets Phospho-Soda.  An IV line was placed.  He was given 5 mg Versed and      50 mg Demerol.  The colonoscope was advanced to the cecum without      difficulty.  The ileocecal valve and appendix were visualized.  The colon      was well prepped.            The colon was normal throughout with no polyps or diverticula seen.  There      were some small hemorrhoids incidentally noted.  The patient tolerated the      procedure well.                                    ___________________________________          Date Signed: __________      Wyvonnia Lora, MD  ([id number])            D: 10/08/2003 by Wyvonnia Lora, MD      T: 10/08/2003 by [id number] ([id number]) ([id number])      cc:  Wyvonnia Lora, MD

## 2014-03-18 ENCOUNTER — Other Ambulatory Visit: Payer: Self-pay

## 2014-03-18 ENCOUNTER — Emergency Department: Payer: Commercial Managed Care - HMO

## 2014-03-18 ENCOUNTER — Emergency Department
Admission: EM | Admit: 2014-03-18 | Discharge: 2014-03-18 | Disposition: A | Discharge status: Home / Self Care | Payer: Commercial Managed Care - HMO | Attending: Emergency Medicine | Admitting: Emergency Medicine

## 2014-03-18 DIAGNOSIS — E119 Type 2 diabetes mellitus without complications: Secondary | ICD-10-CM | POA: Insufficient documentation

## 2014-03-18 DIAGNOSIS — G930 Cerebral cysts: Secondary | ICD-10-CM | POA: Insufficient documentation

## 2014-03-18 DIAGNOSIS — Z87891 Personal history of nicotine dependence: Secondary | ICD-10-CM | POA: Insufficient documentation

## 2014-03-18 DIAGNOSIS — I10 Essential (primary) hypertension: Secondary | ICD-10-CM | POA: Insufficient documentation

## 2014-03-18 HISTORY — DX: Essential (primary) hypertension: I10

## 2014-03-18 LAB — I-STAT CREATININE: Creatinine I-Stat: 1.3 mg/dL (ref 0.6–1.5)

## 2014-03-18 MED ORDER — GADOBUTROL 1 MMOL/ML IV SOLN
8.6000 mL | Freq: Once | INTRAVENOUS | Status: AC | PRN
Start: 2014-03-18 — End: 2014-03-18
  Administered 2014-03-18: 8.6 mmol via INTRAVENOUS
  Filled 2014-03-18: qty 10

## 2014-03-18 NOTE — Consults (Signed)
NEUROSURGERY CONSULTATION    Date Time: 03/18/2014 2:52 PM  Patient Name: Neil Frederick, Neil Frederick  Requesting Physician: Olevia Bowens, MD  Consulting Physician: Dr. Paulette Blanch  Covered By: Neurosurgery PA/pager [phone]        Reason for Consultation:   Large area of low attenuation in right temporal/frontal region, 7 x 4 x 4cm    History:   Neil Frederick is a 62 y.o. male who presents to the hospital on 03/18/2014 with 8 days of dysequilibrium. He reports that every 15 seconds or so he gets a moderate wave of dysequilibrium. He does not feel dizzy, no vision changes, no headaches, no nausea or vomiting, no weakness, no SOB. He has been playing tennis recently and he feels that the dysequilibrium improves when he is active.     Past Medical History:     Past Medical History   Diagnosis Date   . Diabetes mellitus without complication    . Hypertension    HLD    Past Surgical History:     Past Surgical History   Procedure Laterality Date   . Knee surgery     . Knee surgery         Family History:   History reviewed. No pertinent family history.    Social History:     History     Social History   . Marital Status: Unknown     Spouse Name: N/A     Number of Children: N/A   . Years of Education: N/A     Social History Main Topics   . Smoking status: Former Games developer   . Smokeless tobacco: Not on file   . Alcohol Use: Yes   . Drug Use: No   . Sexual Activity: Not on file     Other Topics Concern   . Not on file     Social History Narrative       Allergies:   No Known Allergies    Medications:       Review of Systems:   A comprehensive review of systems was: Negative except as described in HPI. All other systems were reviewed and are negative    Physical Exam:     Filed Vitals:    03/18/14 1353   BP: 152/73   Pulse: 58   Temp:    Resp: 15   SpO2: 99%       Intake and Output Summary (Last 24 hours) at Date Time  No intake or output data in the 24 hours ending 03/18/14 1452        Physical Exam  General: Patient is well developed  and well nourished in no acute distress. Cooperative with the examination. Blood pressure is stable, pulse is regular, temperature is afebrile, and respiratory rate is regular.     Mental Status: The patient is awake, alert, oriented x 3. Recent and remote memory are normal. Attention span, concentration, and language function are normal. There is no evidence of aphasia in conversational speech. There is no agitation, delirium, depressed affect or evidence of delusions or hallucinations. The patient's affect is normal.  NEURO:  PERRL 3mm, EOMI. No nystagmus.   Face is symmetric and without weakness.   Sensation of the face intact. No atrophy or fasiculations noted  Speech fluent and the tongue moves normally  Hearing intact bilaterally to finger rub.   Shoulder shrug is symmetric  The rest of his cranial nerve exam from II to XII is grossly normal.  Motor:  BUE 5/5 strength throughout   BLE 5/5 strength throughout  Muscle tone is normal. There is no cogwheel rigidity.   No pronator drift  Sensory: Sensory examination including light touch reveals no abnormality in upper and lower extremities.   Gait: Deferred  GCS: 15       Labs Reviewed:     Lab Results   Component Value Date    WBC 9.77 10/02/2007    HGB 10.3* 10/02/2007    HCT 31.1* 10/02/2007    MCV 90.7 10/02/2007    PLT 225 10/02/2007     Lab Results   Component Value Date    NA 137 10/02/2007    K 4.2 10/02/2007    CL 100 10/02/2007    CO2 28 10/02/2007     Lab Results   Component Value Date    INR 1.2* 09/29/2007    PT 13.9* 09/29/2007       Rads:   CT Head:  Large area of low attenuation in right temporal/frontal region, 7 x 4 x 4cm.    Assessment:   63 yo M with 8 days of dysequilibrium with Large area of low attenuation in right temporal/frontal region, 7 x 4 x 4cm.    Plan:   MRI Brain WWO Contrast  If MRI Brain reveals arachnoid cyst, patient does not need to be admitted. Further recs pending MRI Brain results.   Consider neurology workup for  dysequilibrium    Signed by: Lutricia Horsfall, PA-C  Date/Time: 03/18/2014 2:52 PM

## 2014-03-18 NOTE — ED Provider Notes (Signed)
Initial brief evaluation done in triage to expedite care, I am not the primary provider for this patient. Pt presents to triage with note from PMD, had head CT done for symptoms of dizziness, sent for MRI.       Juliane Poot, MD  03/18/14 [phone]

## 2014-03-18 NOTE — Discharge Instructions (Signed)
Your doctor today was Dr. Izola Price.  Thank you for the opportunity to care for you.    Return to ER for worsening dizziness, vomiting, vision changes, or for other concerning symptoms.    Follow-up with your primary care physician and neurologist.

## 2014-03-18 NOTE — ED Provider Notes (Signed)
Received sign out from Dr. Izola Price, awaiting MRI and result.  If simple cyst, nothing further and ok to d/c.Marland Kitchen    2153: Discussed ersults with nsg, confirmed nothing furhter.  Will d/c home with PMD and neuro follow up.     Judi Saa, MD  03/18/14 2153

## 2014-03-18 NOTE — ED Notes (Signed)
Bed: S C  Expected date:   Expected time:   Means of arrival:   Comments:

## 2014-03-18 NOTE — ED Provider Notes (Signed)
Physician/Midlevel provider first contact with patient: 03/18/14 1300         History     Chief Complaint   Patient presents with   . Dizziness     The history is provided by the patient. No language interpreter was used.       63yo M h/o DM and HTN presents with one week of dizziness and gait instability. Was seen by PCP and had outpatient CT yesterday which showed "large low-attenuation lesion measuring 7.6 cm in maximum diameter in the anterior aspect of right middle cranial fossa and posterior aspect of the right anterial cranial fossa...mild 3 to 4 mm shift of midline structures" so was sent here further treatment. Denies head pain    PCP: Murow, Buren Kos, MD (General) -> [facility]    Past Medical History   Diagnosis Date   . Diabetes mellitus without complication    . Hypertension        Past Surgical History   Procedure Laterality Date   . Knee surgery     . Knee surgery         History reviewed. No pertinent family history.    Social  History   Substance Use Topics   . Smoking status: Former Games developer   . Smokeless tobacco: Not on file   . Alcohol Use: Yes       .     No Known Allergies    There are no discharge medications for this patient.       Review of Systems   Musculoskeletal: Positive for gait problem.   Neurological: Positive for dizziness. Negative for headaches.   All other systems reviewed and are negative.      Physical Exam    BP: 138/72 mmHg, Heart Rate: 68, Temp: 98.9 F (37.2 C), Resp Rate: 18, SpO2: 97 %    Physical Exam   Constitutional: He is oriented to person, place, and time. He appears well-developed and well-nourished. No distress.   Completely nontoxic   HENT:   Head: Normocephalic and atraumatic.   Eyes: Pupils are equal, round, and reactive to light.   Neck: Normal range of motion.   Cardiovascular: Normal rate and regular rhythm.    Pulmonary/Chest: Effort normal and breath sounds normal.   Abdominal: Soft. There is no tenderness.   Musculoskeletal: Normal range of motion.    Neurological: He is alert and oriented to person, place, and time. No cranial nerve deficit. Coordination normal.   Skin: Skin is warm and dry. He is not diaphoretic.   Psychiatric: He has a normal mood and affect.   Nursing note and vitals reviewed.      MDM and ED Course     ED Medication Orders    None           MDM  Number of Diagnoses or Management Options  Subarachnoid cyst:   Diagnosis management comments: Medical Decision Making      Presumptive Diagnosis: subarachnoid cyst    Treatment Plan: home, see patient instructions for treatment and plan    I reviewed the vital signs, nursing notes, past medical history, past surgical history, family history and social history.  No att. providers found    Vital Signs - BP 157/82 mmHg  Pulse 97  Temp(Src) 98.9 F (37.2 C)  Resp 16  SpO2 97%    Pulse Oximetry Analysis -  Normal    Differential Diagnosis (not completely inclusive): subaracnoid cyst    Laboratory results reviewed by EDP:  No    Radiologic study results reviewed by EDP: Yes  Radiologic Studies Interpreted (viewed) by EDP: Yes            1:48 PM   D/w Neurosurg PA. Aware of pt condition. Will consult.     2:33 PM   NS evaluated the patient. Pt may not need ICU. Will speak with attending.     3:44 PM   Protocol MRI with neurorad attending.       Procedures    Clinical Impression & Disposition     Clinical Impression  Final diagnoses:   Subarachnoid cyst        ED Disposition    Discharge Rod Mae discharge to home/self care.    Condition at disposition: Stable             There are no discharge medications for this patient.       Treatment Team: Scribe: [place], Leandra Kern      I was acting as a Neurosurgeon for [employer], Jocelyn Lamer, MD on [employer] K  Scribe: Kerry Fort    I am the first provider for this patient and I personally performed the services documented. Scribe: Kerry Fort is scribing for me on Basilio,Jadarion K. This note accurately reflects work and decisions made by me.  Olevia Bowens,  MD          Results    Procedure Component Value Units Date/Time    i-Stat Creatinine [[id number]] Collected:  03/18/14 1529     i-STAT Creatinine 1.3 mg/dL Updated:  40/98/11 9147          Radiology Results (24 Hour)    Procedure Component Value Units Date/Time    MRI Brain with/without Contrast [[id number]] Collected:  03/18/14 2111    Order Status:  Completed Updated:  03/18/14 2136    Narrative:      HISTORY: Abnormal head CT. Dizziness and gait instability.    TECHNIQUE: Multiplanar, multisequence MR imaging of the brain was  performed before and after the intravenous administration of 8.6 cc of  Gadavist.    COMPARISON: Prior outside head CT performed at [facility]  imaging    FINDINGS: Correlating with the abnormality on the preceding head CT,  there is a large CSF isointense extra-axial fluid collection involving  the middle cranial fossa on the right, extending along the lateral  aspect of the frontal lobe, spanning the lesser sphenoid wing. This  collection measures up to 7.8 cm superior inferior x 5.2 cm transverse  by approximately 5.1 cm AP. There is some distortion/compression of the  adjacent temporal and frontal lobes, with leftward midline shift that  measures approximately 3 to 4 mm. There is no associated cerebral edema,  and there is no abnormal enhancement.    There are small scattered T2/FLAIR hyperintensities in the  supratentorial white matter. These are nonspecific findings, potentially  reflecting chronic small vessel ischemic changes in a patient this age.  There is no acute infarct or intracranial hemorrhage.    The orbits are grossly unremarkable.      Impression:         1. 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  2. Mild scattered nonspecific T2/FLAIR hyperintensities in the  supratentorial white matter, suggesting chronic small vessel ischemic  changes.    Nicoletta Dress, MD   03/18/2014 9:32 PM      [place] Imported Outside Images [[id number]]  Resulted:  03/18/14 1406  Order Status:  Completed Updated:  03/18/14 1406    Narrative:      The attached image(s) (individually and collectively, "Image") is/are from   prior  encounter(s) occurring outside the current encounter at [facility].  The   Image was obtained from the patient or patient's treating physician (in   some cases, at the request of the patient's referring physician), and is   incorporated into the [facility] medical record system for reference for the   patient's current encounter and future encounters at [place].  No current   interpretation is provided for the Image.          CT Imported Outside Images [[id number]] Resulted:  03/18/14 1406    Order Status:  Completed Updated:  03/18/14 1406    Narrative:      The attached image(s) (individually and collectively, "Image") is/are from   prior  encounter(s) occurring outside the current encounter at [facility].  The   Image was obtained from the patient or patient's treating physician (in   some cases, at the request of the patient's referring physician), and is   incorporated into the [facility] medical record system for reference for the   patient's current encounter and future encounters at [place].  No current   interpretation is provided for the Image.                      Olevia Bowens, MD  03/19/14 [phone]

## 2014-03-23 ENCOUNTER — Other Ambulatory Visit: Payer: Self-pay | Admitting: Neurology

## 2014-03-23 DIAGNOSIS — E236 Other disorders of pituitary gland: Secondary | ICD-10-CM

## 2014-03-23 DIAGNOSIS — Q048 Other specified congenital malformations of brain: Secondary | ICD-10-CM

## 2014-03-23 DIAGNOSIS — G930 Cerebral cysts: Secondary | ICD-10-CM

## 2014-03-23 DIAGNOSIS — I6529 Occlusion and stenosis of unspecified carotid artery: Secondary | ICD-10-CM

## 2014-03-23 DIAGNOSIS — R569 Unspecified convulsions: Secondary | ICD-10-CM

## 2014-03-24 ENCOUNTER — Other Ambulatory Visit: Payer: Self-pay | Admitting: Neurology

## 2014-03-24 DIAGNOSIS — E236 Other disorders of pituitary gland: Secondary | ICD-10-CM

## 2014-03-24 DIAGNOSIS — I6529 Occlusion and stenosis of unspecified carotid artery: Secondary | ICD-10-CM

## 2014-03-24 DIAGNOSIS — G930 Cerebral cysts: Secondary | ICD-10-CM

## 2014-03-24 DIAGNOSIS — R569 Unspecified convulsions: Secondary | ICD-10-CM

## 2014-03-24 DIAGNOSIS — Q048 Other specified congenital malformations of brain: Secondary | ICD-10-CM

## 2014-03-29 ENCOUNTER — Ambulatory Visit: Payer: Commercial Managed Care - HMO

## 2014-04-05 ENCOUNTER — Ambulatory Visit: Payer: Self-pay

## 2014-04-05 ENCOUNTER — Ambulatory Visit: Payer: Commercial Managed Care - HMO

## 2014-04-19 ENCOUNTER — Ambulatory Visit: Payer: Commercial Managed Care - HMO

## 2014-05-05 ENCOUNTER — Encounter (INDEPENDENT_AMBULATORY_CARE_PROVIDER_SITE_OTHER): Payer: Self-pay | Admitting: Neurological Surgery

## 2014-05-12 NOTE — Progress Notes (Signed)
[facility] Neurosurgery  Follow up Note    PCP Name: Marvene Staff, MD  Neurologist: Letitia Caul, MD    CC: arachnoid cyst    Previous Impression/Plan   03/18/2014    63 yo M with 8 days of dysequilibrium with Large area of low attenuation in right temporal/frontal region, 7 x 4 x 4cm.     MRI Brain WWO Contrast  If MRI Brain reveals arachnoid cyst, patient does not need to be admitted. Further recs pending MRI Brain results.    Consider neurology workup for dysequilibrium    HPI     Chief Complaint   Patient presents with   . Establish Care   Neil Frederick is a 63 y.o. male with a known arachnoid cyst, discovered during a workup for dysequilibrium.  He presents with his MRI. He was seen in the hospital and incidentally found to have an arachnoid cyst. He was referred directly to me by his neurologist. He has no complaints of HAs. He has no weakness. He has never had an ICH.    Physical Examination   VITAL SIGNS:   height is 1.651 m (5\' 5" ) and weight is 86.728 kg (191 lb 3.2 oz). His oral temperature is 98.8 F (37.1 C). His blood pressure is 147/81 and his pulse is 64.        Neurologic Exam     Mental Status   Oriented to person, place, and time.   Registration: recalls 3 of 3 objects. Recall at 5 minutes: recalls 3 of 3 objects. Follows 3 step commands.   Attention: normal. Concentration: normal.   Speech: speech is normal   Level of consciousness: alert  Knowledge: good.     Cranial Nerves     CN II   Visual fields full to confrontation.   Visual acuity: normal    CN III, IV, VI   Pupils are equal, round, and reactive to light.  Extraocular motions are normal.     CN V   Facial sensation intact.     CN VII   Facial expression full, symmetric.     CN VIII   CN VIII normal.     CN IX, X   CN IX normal.   CN X normal.     CN XI   CN XI normal.     CN XII   CN XII normal.        No papilledema on fundoscopic examination       Motor Exam   Muscle bulk: normal  Overall muscle tone: normal  Right arm  pronator drift: absent  Left arm pronator drift: absent    Strength   Strength 5/5 throughout.     Sensory Exam   Light touch normal.     Gait, Coordination, and Reflexes     Gait  Gait: normal    Coordination   Finger to nose coordination: normal    Reflexes   Right brachioradialis: 2+  Left brachioradialis: 2+  Right biceps: 2+  Left biceps: 2+  Right triceps: 2+  Left triceps: 2+  Right patellar: 2+  Left patellar: 2+  Right Hoffman: absent  Left Hoffman: absent    Review of Systems   Review of Systems   Neurological: Positive for dizziness.   All other systems reviewed and are negative.    Radiology Interpretation   MRI BRAIN WO--04/19/2014--OUTSIDE IMAGE    Arachnoid cyst, right frontal    Impression   63 yo male  with right frontal arachnoid cyst.     Plan   1. No further work-up required  2. Counseled on details of arachnoid cysts  3. No follow up required    Follow-up   Please call with questions    Levie Heritage, MD

## 2014-05-13 ENCOUNTER — Encounter (INDEPENDENT_AMBULATORY_CARE_PROVIDER_SITE_OTHER): Payer: Self-pay | Admitting: Neurological Surgery

## 2014-05-13 ENCOUNTER — Inpatient Hospital Stay
Admission: RE | Admit: 2014-05-13 | Discharge: 2014-05-13 | Disposition: A | Discharge status: Home / Self Care | Payer: Self-pay | Source: Ambulatory Visit

## 2014-05-13 ENCOUNTER — Other Ambulatory Visit: Payer: Self-pay

## 2014-05-13 ENCOUNTER — Ambulatory Visit (INDEPENDENT_AMBULATORY_CARE_PROVIDER_SITE_OTHER): Payer: Commercial Managed Care - HMO | Admitting: Neurological Surgery

## 2014-05-13 VITALS — BP 147/81 | HR 64 | Temp 98.8°F | Ht 65.0 in | Wt 191.2 lb

## 2014-05-13 DIAGNOSIS — G930 Cerebral cysts: Secondary | ICD-10-CM

## 2014-05-13 NOTE — Progress Notes (Signed)
Review of Systems   Constitutional: Negative.    HENT: Negative.    Eyes: Negative.    Respiratory: Negative.    Cardiovascular: Negative.    Gastrointestinal: Negative.    Endocrine: Negative.    Genitourinary: Negative.    Musculoskeletal: Negative.    Skin: Negative.    Allergic/Immunologic: Negative.    Neurological: Positive for light-headedness (3 months).   Hematological: Negative.    Psychiatric/Behavioral: Negative.

## 2016-06-14 ENCOUNTER — Inpatient Hospital Stay: Payer: Commercial Managed Care - HMO | Attending: Orthopaedic Surgery

## 2016-06-14 DIAGNOSIS — M542 Cervicalgia: Secondary | ICD-10-CM | POA: Insufficient documentation

## 2016-06-14 DIAGNOSIS — M1288 Other specific arthropathies, not elsewhere classified, other specified site: Secondary | ICD-10-CM | POA: Insufficient documentation

## 2016-06-14 DIAGNOSIS — M6281 Muscle weakness (generalized): Secondary | ICD-10-CM | POA: Insufficient documentation

## 2016-06-14 DIAGNOSIS — M47812 Spondylosis without myelopathy or radiculopathy, cervical region: Secondary | ICD-10-CM | POA: Insufficient documentation

## 2016-06-14 NOTE — PT/OT Therapy Note (Signed)
INITIAL EVALUATION (Cervical)    Name: Neil Frederick Age: 65 y.o. 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   # of Authorized Visits:  10 Visit #   1today     SUBJECTIVE:    Mechanism of Injury: Patient reports that 6 months ago he had hip pain and so he was sitting weird and walking weird... Pt had to start sleeping on his R side and believes that once he started sleeping on this side he began to have pain when looking left or right. More pain with turning his head to the L and patient feels as though he has no ROM to the L. Xray was performed that revelad bone spurs through cervical spine with nerve root compress. Pt denies any HA.  Pt denies any N&T.     Patient's reason for seeking PT / Goal: to reduce pain in neck and be able to look to left and right without pain    Past Medical History:   Past Medical History:   Diagnosis Date   . Arthritis    . Diabetes mellitus without complication    . Hypercholesteremia    . Hypertension    . Osteoarthritis      Medications: .  alfuzosin (UROXATRAL) 10 MG 24 hr tablet  .  amLODIPine (NORVASC) 10 MG tablet  .  Ascorbic Acid (VITAMIN C) 1000 MG tablet  .  aspirin EC 81 MG EC tablet  .  atorvastatin (LIPITOR) 40 MG tablet  .  azelastine (ASTELIN) 0.1 % nasal spray  .  B Complex-Biotin-FA (B-COMPLEX PO)  .  Cholecalciferol (VITAMIN D3) 2000 UNITS Tab  .  Coenzyme Q10 300 MG Cap  .  enalapril (VASOTEC) 5 MG tablet  .  GARCINIA CAMBOGIA-CHROMIUM PO  .  Glucosamine-Chondroitin-MSM (TRIPLE FLEX PO)  .  JANUVIA 100 MG tablet  .  meloxicam (MOBIC) 7.5 MG tablet  .  Multiple Vitamin (MULTIVITAMIN) tablet  .  Omega-3 Fatty Acids (EQL OMEGA 3 FISH OIL) 1400 MG Cap   Fall Risks: None    Other Treatment/Prior Therapy: No  Prior  Hospitalization: NA  Hand Dominance: Dominant Hand: Right Involved Side: Involved Side: Bilateral   Diagnostic Tests: Xray as above    Outcome Measure:   NDI Score: 10                                    % Pain Score: 70% Rate Satisfaction with Current Function: 3/10     Living Environment: Type of Residence: Multi-story home   Patient lives with: Living Arrangements: Alone    OBJECTIVE:    Vitals: BP: 167/81 Heart Rate: (!) 58      Observation/Posture/Gait/Integumentary  Posture: Deficits noted: Forward Head, Femoral internal rotation bilateral, decreased Cervical Lordosis  and increased Thoracic Kyphosis  Gait: Within functional limits  Integumentary: No wound, lesion or rash noted    AROM: Cervical Spine (deg) Initial   Current  Current    Flexion 66      Extension 45       Initial L Initial R Current L Current R   Sidebend 35 35     Rotation 25 40     AROM: Shoulder (  deg) Initial L Initial R Current L Current R   Flexion [facility] WFL     Extension [facility] WFL     Abduction [facility]     External Rotation [facility] WFL     Internal Rotation WFL WFL     (blank fields were intentionally left blank)    Repeated flexion/extension centralizes symptoms? No    Initial   STRENGTH: Cervical MMT            /5 Current  Current    4  C 1/2 Neck Flex     4  Neck Ext     Initial L Initial R  Current L Current R   5 5 C3 Neck Sidebend     5 5 Neck Rotation     5 5 Shoulder Flex     5 5 C5 Shoulder Abd     5 5 C6 Biceps     5 5 C7 Triceps     5 5 C7 Wrist Flex     5 5 C7 Wrist Ext     4 4 C4 Mid Trap     4 4 C4 Low Trap     5 5 Serratus     5 5 Shoulder IR     5 5 Shoulder ER     (blank fields were intentionally left blank)    Dynamometer Grip Strength:  L: elbow flexed NT#  elbow extended NT#  R: elbow flexed NT#  elbow extended NT#    Palpation: No pain to palpation. Muscle spasms through UT/LS/SCM    Joint Mobility Assessment: hypomobile C1/C2, C5/C6 End feel: Bony    Special Tests/Neurological Screen:    Sensation: Intact to ligth touch  C4-C6      Special Tests L R  L R   Spurling  (-) (-) Romberg's Test (UMN lesion) (-) (-)   Distraction Test (-) (-) VBI Test (-) (-)   Upper Limb Tension Tests (-) (-) Alar Ligament Stability Test (-) (-)   Thoracic Outlet (-) (-) Tectorial Membrane Stability Test (-) (-)   Modified Sharp Purser Test (-) (-) Hawkins Kennedy Test (-) (-)   Cervical quadrant testing (+) (-)        Treatment Initial Visit:  Evaluation   Patient Education on role of patient and physical therapist in order to reach mutually established goals  Therapeutic exercise with instruction in HEP and provided patient written and illustrated handout Yes  Therapeutic Activity N/A  Manual N/A  Modalities: None  Rehab Potential:good  Is patient aware of diagnosis: Yes  For Next Visit Add therex as per flow sheet review ergonomics    [name] PT, DPT   06/14/2016    Total Treatment (billable) Time:  40  Total Timed Minutes:  12    Plan of Care IPTC Medicare Provider #: [phone]                Patient Name: Neil Frederick  MRN: [id number]  DOI: Onset of Problem / Injury: 12/14/15 DOS: N/A  SOC:06/14/16    Diagnosis:     ICD-10-CM    1. Cervical pain M54.2    2. 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 Muscle weakness M62.81        ASSESSMENT: the patient is a 65 y.o. male presenting with cervicalgia who requires Physical Therapy for the following:  Impairments: (+) cervical pain L>R, facet dysfuction, deep neck cervial weakness, periscapular weakness, poor posturing, muscle spasms  through UT/LS/SCM    Barriers to Rehabilitations/Comorbidities/personal  factors:  Patient's age delayed healing and intensity  Time since onset of injury/illness/exacerbation history of 6 months  Patient's level of motivation concerns about motivation for changing posture    Pain located: cervical L>R, no radiculopathy     Clinical presentation: stable characteristics facet arthropathy with no no N&T    Functional Limitations (PLOF): unable to turn head to the L  secondary to pain to check blind spot (able no difficulty), woken up 2-3x a night secondary to neck pain (able no problem), unable to sit upright secondary to cervical-thoracic pain (able no difficulty)     Plan Of Care: Body Mechanics Education and NMR    Frequency/Duration: 2 times a week for 10 sessions. Certification Status Ends: 08/12/2016    Goals:  Date (Body Area, Impairment Goal, Functional   Activity, Target Performance) Time Frame Status Date/  Initial   06/14/16   Increase active cervical rotation to 55 degrees for increased visual field to allow safe changing lanes and backing up vehicle while driving.  10 sessions Initial Eval    06/14/16   Increase mid-trap/lower trap strength 5/5 for maintenance of proper sitting posture needed for computer use > 6 hours.  10 sessions Initial Eval    06/14/16   Demonstrate proper posture and body mechanics with sleeping position to allow patient to sleep undisturbed > 7 hours.  10 sessions Initial Eval    06/14/16   Patient will demonstrate independence in prescribed HEP with proper form, sets and reps for safe discharge to an independent program.  10 sessions Initial Eval      Signature: [name] PT, DPT  Date: 06/14/2016    Signature: Abe People, MD _______________________  Date:  ________________     Patient Name: Neil Frederick  MRN: [id number]

## 2016-06-14 NOTE — PT/OT Exercise Plan (Signed)
Name: Neil Frederick  Referring Physician: Abe People, MD  Diagnosis:     ICD-10-CM    1. Cervical pain M54.2    2. 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 Muscle weakness M62.81         Precautions:  suprachorioid cyst  Date of Surgery:   NA MD Follow-up: PRN after PT          Exercise Flow Sheet    Exercise Specifics 06/14/16               UBE  --             Scapular squeeze    10 x 10"  MSP             Chin tuck supine    10 x 10"  MSP             Supine cervical rotation    10 x 10"  MSP               Prone W  --             Chint uck into ball    --             DWS    --                                                                                 Home Exercise Program    Issued  MSP             (Initials = supervised exercise by clinician)      Access Code: [email or web address]   URL: [email or web address]   Date: 06/14/2016   Prepared by: Afton Hendershot     Exercises   Supine Cervical Rotation AROM on Pillow - 10 reps - 3 sets - 3 hold - 2x daily - 7x weekly   Standing Scapular Retraction - 10 reps - 3 sets - 3 hold - 2x daily - 7x weekly   Supine Cervical Retraction with Towel - 10 reps - 3 sets - 3 hold - 2x daily - 7x weekly

## 2016-07-02 ENCOUNTER — Inpatient Hospital Stay
Payer: Commercial Managed Care - HMO | Attending: Orthopaedic Surgery | Admitting: Rehabilitative and Restorative Service Providers"

## 2016-07-02 DIAGNOSIS — M47812 Spondylosis without myelopathy or radiculopathy, cervical region: Secondary | ICD-10-CM

## 2016-07-02 DIAGNOSIS — M1288 Other specific arthropathies, not elsewhere classified, other specified site: Secondary | ICD-10-CM | POA: Insufficient documentation

## 2016-07-02 DIAGNOSIS — M542 Cervicalgia: Secondary | ICD-10-CM | POA: Insufficient documentation

## 2016-07-02 DIAGNOSIS — M6281 Muscle weakness (generalized): Secondary | ICD-10-CM | POA: Insufficient documentation

## 2016-07-02 NOTE — PT/OT Therapy Note (Signed)
DAILY NOTE   07/02/2016        Total Treatment (billable)Time: 40   Total Timed Minutes:  40 Visit Number:  2      Payor: AETNA / Plan: AETNA HMO / Product Type: *No Product type* /    # of Authorized Visits: 12 Visit #: 2      Diagnosis (Treating/Medical):     ICD-10-CM    1. Cervical pain M54.2    2. 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 Muscle weakness M62.81            Subjective:  Katrina's pain is Increases with movement - neck rotation  Functional Status:  Feels that improved motion for turning head. Working on [employer] throughout the day     Objective:   Treatment:  Therapeutic Exercise: to improve: Flexibility/ROM, Stabilization and Strength   Warm-up:  UBE with subjective obtained to assist with today's treatment session   Modifications/Patient Education: review HEP  Verbal, Visual and Tactile cues exercises initiated as per flow     ROM/Manual Stretches ---    NMR:  --    Therapeutic Activities:  Therapeutic Activity - Bed Mobility:   Patient educated and demo provided on how to get in/out of bed with safe body mechanics.  Instructed patient to sit on bed and transition to/from side, lowering body with UE assist while simultaneously lifting/lowering LE.  Once in position, cues for log rolling technique to prevent strain on spine.  Patient performed activity.     Therapeutic Activities - Sleep Positions:   Educated patient on proper sleep positions to avoid low back/neck/hip pain by utilizing pillow between knees and  placing small hand towel above iliac crest to equally distribute body weight and reduce mechanical forcewhen side lying and to place pillow under knees when in supine. Educated patient on pillow height to to make up distance between shoulder and ear to prevent side bending during sleep in side lying and to promote neutral cervical spine when supine. Each position demonstrated and practiced.     Therapeutic Activity - Sitting ergonomics/computer set-up:   Reviewed proper sitting alignment for  driving/TV viewing/computer usage to reduce mechanic stress on spine. Demonstration/performance of sitting posture with lumbar support (1/2 roll/cushion and hips/knees at 90).  Instruction on how to align computer height to avoid compensation, set-up of computer monitor, keyboard, mouse, and computer chair to promote proper alignment during work activities. Discussed difference between destop and laptop use.             Manual Therapy:   Prone grade ll thoracic mobs             STM to UT and lev scap       Initial Evaluation Reference and/orCurrent Measurements (ROM, Strength, Girth, Outcomes, etc.):         AROM: Cervical Spine (deg) Initial      10/23    Flexion 66       Extension 45         Initial L Initial R     Sidebend 35 35     Rotation 25 40  B 50     AROM: Shoulder (deg) Initial L Initial R     Flexion [facility]       Extension [facility] WFL       Abduction [facility]       External Rotation [facility]       Internal Rotation [facility] WFL       (  blank fields were intentionally left blank)     Repeated flexion/extension centralizes symptoms? No     Initial    STRENGTH: Cervical MMT            /5 Current  Current    4   C 1/2 Neck Flex       4   Neck Ext       Initial L Initial R   Current L Current R   5 5 C3 Neck Sidebend       5 5 Neck Rotation       5 5 Shoulder Flex       5 5 C5 Shoulder Abd       5 5 C6 Biceps       5 5 C7 Triceps       5 5 C7 Wrist Flex       5 5 C7 Wrist Ext       4 4 C4 Mid Trap       4 4 C4 Low Trap       5 5 Serratus       5 5 Shoulder IR       5 5 Shoulder ER       (blank fields were intentionally left blank)     Dynamometer Grip Strength:    IE      L: elbow flexed NT#                elbow extended NT#      R: elbow flexed NT#               elbow extended NT#       Modalities: None  Therapy Rationale: Other: ---       Assessment (response to treatment):   Good return demo of HEP- + overpressure to seated c/s retraction. Marked restrictions in thoracic w/ manual. He required extensive education on  rational of the importance of correcting posture as he is concerned what others think.  Improved awareness of sitting posture post practice     Progress towards functional goals: see goal chart     Patient requires continued skilled care to: improve neck and thoracic    Plan:  Continue with Plan of Care  f/u on posture . + DWS to HEP     Robynn Pane PT  ,  [place] [address]  07/02/2016       Information below copied from evaluation as reference information unless noted/dated on the goal grid:      Functional Limitations (PLOF): IE  unable to turn head to the L secondary to pain to check blind spot (able no difficulty), woken up 2-3x a night secondary to neck pain (able no problem), unable to sit upright secondary to cervical-thoracic pain (able no difficulty)      Plan Of Care: Body Mechanics Education and NMR     Frequency/Duration: 2 times a week for 10 sessions.        Certification Status Ends: 08/12/2016     Goals:  Date (Body Area, Impairment Goal, Functional   Activity, Target Performance) Time Frame Status Date/  Initial   06/14/16   Increase active cervical rotation to 55 degrees for increased visual field to allow safe changing lanes and backing up vehicle while driving.  10 sessions 50 deg  07/02/16 ms    06/14/16   Increase mid-trap/lower trap strength 5/5 for maintenance of proper sitting posture needed for computer use > 6  hours.  10 sessions      06/14/16   Demonstrate proper posture and body mechanics with sleeping position to allow patient to sleep undisturbed > 7 hours.  10 sessions      06/14/16   Patient will demonstrate independence in prescribed HEP with proper form, sets and reps for safe discharge to an independent program.  10 sessions Initial Eval

## 2016-07-02 NOTE — PT/OT Exercise Plan (Signed)
Name: Neil Frederick  Referring Physician: Abe People, MD  Diagnosis:     ICD-10-CM    1. Cervical pain M54.2    2. 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 Muscle weakness M62.81         Precautions:  suprachorioid cyst  Date of Surgery:   NA MD Follow-up: PRN after PT          Exercise Flow Sheet    Exercise Specifics 06/14/16 10/23              UBE  -- 2 min fwd bwd ms            Scapular squeeze    10 x 10"  MSP Review 10 cnt 1x5 ms    Wall push ups            Chin tuck supine    10 x 10"  MSP Seated w/ overpressure   10 cnt 1x10 ms            Supine cervical rotation    10 x 10"  MSP Prone prop - c/s retraction w/ scap depression 10 cnt 1x10 ms              Prone W  -- 10 cnt 1x10 ms            Chint uck into ball    -- ---            DWS    -- 30 sec x3 ms             1/2 roll      Tin man, H abd   1x20 each ms                               mechanics      Bed mobility  Sleep posture, sitting     ms                             Home Exercise Program    Issued  MSP             (Initials = supervised exercise by clinician)      Access Code: [email or web address]   URL: [email or web address]   Date: 06/14/2016   Prepared by: Afton Hendershot     Exercises   Supine Cervical Rotation AROM on Pillow - 10 reps - 3 sets - 3 hold - 2x daily - 7x weekly   Standing Scapular Retraction - 10 reps - 3 sets - 3 hold - 2x daily - 7x weekly   Supine Cervical Retraction with Towel - 10 reps - 3 sets - 3 hold - 2x daily - 7x weekly

## 2016-07-03 NOTE — PT/OT Therapy Note (Signed)
DAILY NOTE   07/04/2016        Total Treatment (billable)Time: 40   Total Timed Minutes:  40 Visit Number:  3      Payor: AETNA / Plan: AETNA HMO / Product Type: *No Product type* /    # of Authorized Visits: 12 Visit #: 3      Diagnosis (Treating/Medical):     ICD-10-CM    1. Cervical pain M54.2    2. 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 Muscle weakness M62.81            Subjective:  Neil Frederick's neck motion better  Functional Status:  Tried putting a towel roll in pillow case when slept and was not able to move neck the next AM. Did not use last night and was back to improved neck motion. He has not tried to use towel roll when sitting but is trying to sit w/ improved posture     Objective:   Treatment:  Therapeutic Exercise: to improve: Flexibility/ROM, Stabilization and Strength   Warm-up:  UBE with subjective obtained to assist with today's treatment session   Modifications/Patient Education:  Verbal, Visual and Tactile cues exercises + H abd for postural training and neck/thoracic  Motion, + chin tuck at wall     ROM/Manual Stretches ---    NMR verbal & tactile cues to facilitate TrA recruitment w/ wall push ups for improved lumbar stability            verbal and tactile cueing to avoid upper trap compensation and abnormal scapulohumeral rhythm during prone strengthening and H abd in standing     Therapeutic Activities:          Manual Therapy:   Prone grade ll thoracic mobs             STM to UT and lev scap       Initial Evaluation Reference and/orCurrent Measurements (ROM, Strength, Girth, Outcomes, etc.):         AROM: Cervical Spine (deg) Initial      10/23    Flexion 66       Extension 45         Initial L Initial R     Sidebend 35 35     Rotation 25 40  B 50     AROM: Shoulder (deg) Initial L Initial R     Flexion [facility] WFL       Extension [facility] WFL       Abduction [facility]       External Rotation [facility] WFL       Internal Rotation WFL WFL       (blank fields were intentionally left blank)     Repeated  flexion/extension centralizes symptoms? No     Initial    STRENGTH: Cervical MMT            /5 Current  Current    4   C 1/2 Neck Flex       4   Neck Ext       Initial L Initial R   Current L Current R   5 5 C3 Neck Sidebend       5 5 Neck Rotation       5 5 Shoulder Flex       5 5 C5 Shoulder Abd       5 5 C6 Biceps       5 5 C7 Triceps  5 5 C7 Wrist Flex       5 5 C7 Wrist Ext       4 4 C4 Mid Trap       4 4 C4 Low Trap       5 5 Serratus       5 5 Shoulder IR       5 5 Shoulder ER       (blank fields were intentionally left blank)     Dynamometer Grip Strength:    IE      L: elbow flexed NT#                elbow extended NT#      R: elbow flexed NT#               elbow extended NT#       Modalities: None  Therapy Rationale: Other: ---       Assessment (response to treatment):   Improved mobility in thoracic spine w/ manual he stated that is trying to be compliant w/ HEP and sitting posture.  He was challenged w/motor sequencing of H abd w/ c/s rot so frequent verbal and tactile cues to improve technique and to keep elbows straight.   Maintain trunk align w/ wall push ups required verbal and tactile cues to facilitate TrA     Progress towards functional goals: see goal chart     Patient requires continued skilled care to: improve neck and thoracic    Plan:  Continue with Plan of Care  Con to work on thoracic mobility and postural awareness     Robynn Pane PT  ,  [place] [address]  07/04/2016       Information below copied from evaluation as reference information unless noted/dated on the goal grid:      Functional Limitations (PLOF): IE  unable to turn head to the L secondary to pain to check blind spot (able no difficulty), woken up 2-3x a night secondary to neck pain (able no problem), unable to sit upright secondary to cervical-thoracic pain (able no difficulty)      Plan Of Care: Body Mechanics Education and NMR     Frequency/Duration: 2 times a week for 10 sessions.        Certification Status Ends:  08/12/2016     Goals:  Date (Body Area, Impairment Goal, Functional   Activity, Target Performance) Time Frame Status Date/  Initial   06/14/16   Increase active cervical rotation to 55 degrees for increased visual field to allow safe changing lanes and backing up vehicle while driving.  10 sessions 50 deg  07/02/16 ms    06/14/16   Increase mid-trap/lower trap strength 5/5 for maintenance of proper sitting posture needed for computer use > 6 hours.  10 sessions      06/14/16   Demonstrate proper posture and body mechanics with sleeping position to allow patient to sleep undisturbed > 7 hours.  10 sessions      06/14/16   Patient will demonstrate independence in prescribed HEP with proper form, sets and reps for safe discharge to an independent program.  10 sessions

## 2016-07-03 NOTE — PT/OT Exercise Plan (Signed)
Name: Neil Frederick  Referring Physician: Abe People, MD  Diagnosis:     ICD-10-CM    1. Cervical pain M54.2    2. 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 Muscle weakness M62.81         Precautions:  suprachorioid cyst  Date of Surgery:   NA MD Follow-up: PRN after PT          Exercise Flow Sheet    Exercise Specifics 06/14/16 10/23 10/25              UBE  -- 2 min fwd bwd ms 3 min fwd 3 min bwd ms            Scapular squeeze    10 x 10"  MSP Review 10 cnt 1x5 ms    Wall push ups ---           Chin tuck supine    10 x 10"  MSP Seated w/ overpressure   10 cnt 1x10 ms ----           Supine cervical rotation    10 x 10"  MSP Prone prop - c/s retraction w/ scap depression 10 cnt 1x10 ms 10 cnt 1x10 ms             Prone W  -- 10 cnt 1x10 ms 10 cnt 1x10 ms           Chint uck into ball    -- --- 10 cnt   1 x10 ms           DWS    -- 30 sec x3 ms  DWS 30 sec x3 HEP  ms    sunsalute 1x10 ms  sunsalute           1/2 roll      Tin man, H abd   1x20 each ms   1x20 ms                 H abd w/ c/s rot YTB alt 1x15 ms           mechanics      Bed mobility  Sleep posture, sitting     ms                             Home Exercise Program    Issued  MSP  Updated ms            (Initials = supervised exercise by clinician)      Access Code: [email or web address]   URL: [email or web address]   Date: 07/04/2016   Prepared by: Robynn Pane     Exercises   Supine Cervical Rotation AROM on Pillow - 10 reps - 3 sets - 3 hold - 2x daily - 7x weekly   Standing Scapular Retraction - 10 reps - 3 sets - 3 hold - 2x daily - 7x weekly   Supine Cervical Retraction with Towel - 10 reps - 3 sets - 3 hold - 2x daily - 7x weekly   Doorway Pec Stretch at 90 Degrees Abduction - 1 reps - 3 sets - 30 hold - 2x daily - 5x weekly

## 2016-07-04 ENCOUNTER — Inpatient Hospital Stay
Payer: Commercial Managed Care - HMO | Attending: Orthopaedic Surgery | Admitting: Rehabilitative and Restorative Service Providers"

## 2016-07-04 DIAGNOSIS — M1288 Other specific arthropathies, not elsewhere classified, other specified site: Secondary | ICD-10-CM | POA: Insufficient documentation

## 2016-07-04 DIAGNOSIS — M542 Cervicalgia: Secondary | ICD-10-CM | POA: Insufficient documentation

## 2016-07-04 DIAGNOSIS — M6281 Muscle weakness (generalized): Secondary | ICD-10-CM | POA: Insufficient documentation

## 2016-07-04 DIAGNOSIS — M47812 Spondylosis without myelopathy or radiculopathy, cervical region: Secondary | ICD-10-CM

## 2016-07-10 ENCOUNTER — Inpatient Hospital Stay: Payer: Commercial Managed Care - HMO | Attending: Orthopaedic Surgery

## 2016-07-10 DIAGNOSIS — M542 Cervicalgia: Secondary | ICD-10-CM | POA: Insufficient documentation

## 2016-07-10 DIAGNOSIS — M47812 Spondylosis without myelopathy or radiculopathy, cervical region: Secondary | ICD-10-CM

## 2016-07-10 DIAGNOSIS — M6281 Muscle weakness (generalized): Secondary | ICD-10-CM | POA: Insufficient documentation

## 2016-07-10 DIAGNOSIS — M1288 Other specific arthropathies, not elsewhere classified, other specified site: Secondary | ICD-10-CM | POA: Insufficient documentation

## 2016-07-10 NOTE — PT/OT Exercise Plan (Signed)
Name: Neil Frederick  Referring Physician: Abe People, MD  Diagnosis:     ICD-10-CM    1. Cervical pain M54.2    2. 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 Muscle weakness M62.81         Precautions:  suprachorioid cyst  Date of Surgery:   NA MD Follow-up: PRN after PT          Exercise Flow Sheet    Exercise Specifics 06/14/16 10/23 10/25  10/31            UBE  -- 2 min fwd bwd ms 3 min fwd 3 min bwd ms  3'/3'  60RPM  MSP          Scapular squeeze    10 x 10"  MSP Review 10 cnt 1x5 ms    Wall push ups --- --          Chin tuck supine    10 x 10"  MSP Seated w/ overpressure   10 cnt 1x10 ms ---- --          Supine cervical rotation    10 x 10"  MSP Prone prop - c/s retraction w/ scap depression 10 cnt 1x10 ms 10 cnt 1x10 ms --            Prone W  -- 10 cnt 1x10 ms 10 cnt 1x10 ms 10 x 10  MSP          Chint uck into ball    -- --- 10 cnt   1 x10 ms --          DWS    -- 30 sec x3 ms  DWS 30 sec x3 HEP  ms    sunsalute 1x10 ms  sunsalute on 1/2 foam  2'  MSP          1/2 roll      Tin man, H abd   1x20 each ms   1x20 ms H abd GTB  10 x 3  1/2 foam  MSP                H abd w/ c/s rot YTB alt 1x15 ms Plinth push ups  10 x 2  MSP          mechanics      Bed mobility  Sleep posture, sitting     ms  GTB  PD  And   Gator  GTB  10 x 3  MSP                           Home Exercise Program    Issued  MSP  Updated ms            (Initials = supervised exercise by clinician)      Access Code: [email or web address]   URL: [email or web address]   Date: 07/04/2016   Prepared by: Robynn Pane     Exercises   Supine Cervical Rotation AROM on Pillow - 10 reps - 3 sets - 3 hold - 2x daily - 7x weekly   Standing Scapular Retraction - 10 reps - 3 sets - 3 hold - 2x daily - 7x weekly   Supine Cervical Retraction with Towel - 10 reps - 3 sets - 3 hold - 2x daily - 7x weekly   Doorway Pec Stretch at 90 Degrees Abduction - 1 reps - 3 sets - 30 hold - 2x daily - 5x weekly

## 2016-07-10 NOTE — PT/OT Therapy Note (Signed)
DAILY NOTE   07/10/2016        Total Treatment (billable)Time: 35  Total Timed Minutes:  35 Visit Number:  4      Payor: AETNA / Plan: AETNA HMO / Product Type: *No Product type* /    # of Authorized Visits: 12 Visit #: 4      Diagnosis (Treating/Medical):     ICD-10-CM    1. Cervical pain M54.2    2. 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 Muscle weakness M62.81            Subjective:  Neil Frederick reports that he has been doing well and has not had any significant neck pain since last Monday when he tried to sleep with a pillow. Pt reports that he overall feels stiff in the morning.   Functional Status:  See above    Objective:   Treatment:see above  Therapeutic Exercise: to improve: Flexibility/ROM, Stabilization and Strength   Warm-up:  UBE with subjective obtained to assist with today's treatment session   Modifications/Patient Education:  Verbal, Visual and Tactile cues exercises with supine H. Abduction on 1/2 foam to improve periscap recruitment. Addition of snow angles   ROM/Manual Stretches L upper trap stretch    NMR verbal & tactile cues to facilitate TrA recruitment w/ plinth push ups for improved lumbar stability            verbal and tactile cueing to avoid upper trap compensation and abnormal scapulohumeral rhythm during prone strengthening and H abd in standing/supine     Therapeutic Activities:          Manual Therapy:   Prone grade ll thoracic mobs             STM to UT and lev scap    G6 Trp release LS L insertion      Initial Evaluation Reference and/orCurrent Measurements (ROM, Strength, Girth, Outcomes, etc.):         AROM: Cervical Spine (deg) Initial      10/23 10/31   Flexion 66    70   Extension 45    50     Initial L Initial R  L/R   Sidebend 35 35  35/35   Rotation 25 40  B 50  46/65   (blank fields were intentionally left blank)     Repeated flexion/extension centralizes symptoms? No     Initial    STRENGTH: Cervical MMT            /5 Current  Current    4   C 1/2 Neck Flex       4   Neck Ext        Initial L Initial R   Current L Current R   5 5 C3 Neck Sidebend       5 5 Neck Rotation       5 5 Shoulder Flex       5 5 C5 Shoulder Abd       5 5 C6 Biceps       5 5 C7 Triceps       5 5 C7 Wrist Flex       5 5 C7 Wrist Ext       4 4 C4 Mid Trap       4 4 C4 Low Trap       5 5 Serratus       5 5 Shoulder IR  5 5 Shoulder ER       (blank fields were intentionally left blank)         Modalities: None  Therapy Rationale: Other: ---       Assessment (response to treatment):   Continued focus on thoracic mobility to reduce cervical pain secondary to imbalance. Pt continue with TrP through L LS insertion on medial border of scap that is improved with GRASTON intervention. Pt progressed to GTB for all therex this session and plinth push ups. Pt tolerates well with no reports of pain. Tactile cueing as described above to assist with periscapular motor recruitment. Pt advised to use ice if experience of sore after progression of therex.     Progress towards functional goals: see goal chart     Patient requires continued skilled care to: improve neck and thoracic    Plan:  Continue with Plan of Care  Con to work on thoracic mobility and postural awareness     [name] PT, DPT    07/10/2016       Information below copied from evaluation as reference information unless noted/dated on the goal grid:      Functional Limitations (PLOF): IE  unable to turn head to the L secondary to pain to check blind spot (able no difficulty), woken up 2-3x a night secondary to neck pain (able no problem), unable to sit upright secondary to cervical-thoracic pain (able no difficulty)      Plan Of Care: Body Mechanics Education and NMR     Frequency/Duration: 2 times a week for 10 sessions.        Certification Status Ends: 08/12/2016     Goals:  Date (Body Area, Impairment Goal, Functional   Activity, Target Performance) Time Frame Status Date/  Initial   06/14/16   Increase active cervical rotation to 55 degrees for  increased visual field to allow safe changing lanes and backing up vehicle while driving.  10 sessions 50 deg  07/02/16 ms    06/14/16   Increase mid-trap/lower trap strength 5/5 for maintenance of proper sitting posture needed for computer use > 6 hours.  10 sessions      06/14/16   Demonstrate proper posture and body mechanics with sleeping position to allow patient to sleep undisturbed > 7 hours.  10 sessions      06/14/16   Patient will demonstrate independence in prescribed HEP with proper form, sets and reps for safe discharge to an independent program.  10 sessions Reports compliance 07/10/16  MSP

## 2016-07-12 ENCOUNTER — Inpatient Hospital Stay: Payer: Commercial Managed Care - HMO | Attending: Orthopaedic Surgery

## 2016-07-12 DIAGNOSIS — M6281 Muscle weakness (generalized): Secondary | ICD-10-CM | POA: Insufficient documentation

## 2016-07-12 DIAGNOSIS — M1288 Other specific arthropathies, not elsewhere classified, other specified site: Secondary | ICD-10-CM | POA: Insufficient documentation

## 2016-07-12 DIAGNOSIS — M47812 Spondylosis without myelopathy or radiculopathy, cervical region: Secondary | ICD-10-CM

## 2016-07-12 DIAGNOSIS — M542 Cervicalgia: Secondary | ICD-10-CM | POA: Insufficient documentation

## 2016-07-12 NOTE — PT/OT Exercise Plan (Signed)
Name: Neil Frederick  Referring Physician: Abe People, MD  Diagnosis:     ICD-10-CM    1. Cervical pain M54.2    2. 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 Muscle weakness M62.81         Precautions:  suprachorioid cyst  Date of Surgery:   NA MD Follow-up: PRN after PT          Exercise Flow Sheet    Exercise Specifics 06/14/16 10/23 10/25  10/31 07/12/16             UBE  -- 2 min fwd bwd ms 3 min fwd 3 min bwd ms  3'/3'  60RPM  MSP 3'3'  60 rpm  aw           Scapular squeeze    10 x 10"  MSP Review 10 cnt 1x5 ms    Wall push ups --- -- --         Chin tuck supine    10 x 10"  MSP Seated w/ overpressure   10 cnt 1x10 ms ---- -- --         Supine cervical rotation    10 x 10"  MSP Prone prop - c/s retraction w/ scap depression 10 cnt 1x10 ms 10 cnt 1x10 ms -- --           Prone W  -- 10 cnt 1x10 ms 10 cnt 1x10 ms 10 x 10  MSP 10''x10  aw         Chint uck into ball    -- --- 10 cnt   1 x10 ms -- Lateral walk on wall   RTB x2   aw         DWS    -- 30 sec x3 ms  DWS 30 sec x3 HEP  ms    sunsalute 1x10 ms  sunsalute on 1/2 foam  2'  MSP 2' on 1/2 foam  aw         1/2 roll      Tin man, H abd   1x20 each ms   1x20 ms H abd GTB  10 x 3  1/2 foam  MSP 3x10  1/2 foam  aw               H abd w/ c/s rot YTB alt 1x15 ms Plinth push ups  10 x 2  MSP 2x10  aw         mechanics      Bed mobility  Sleep posture, sitting     ms  GTB  PD  And   Gator  GTB  10 x 3  MSP GTB PD  3x10  aw                 finning   GTB  x20  aw         Home Exercise Program    Issued  MSP  Updated ms            (Initials = supervised exercise by clinician)      Access Code: [email or web address]   URL: [email or web address]   Date: 07/04/2016   Prepared by: Robynn Pane     Exercises   Supine Cervical Rotation AROM on Pillow - 10 reps - 3 sets - 3 hold - 2x daily - 7x weekly   Standing Scapular Retraction - 10 reps - 3 sets - 3 hold - 2x daily - 7x weekly  Supine Cervical Retraction with Towel - 10 reps - 3 sets - 3 hold - 2x daily - 7x weekly    Doorway Pec Stretch at 90 Degrees Abduction - 1 reps - 3 sets - 30 hold - 2x daily - 5x weekly

## 2016-07-12 NOTE — PT/OT Therapy Note (Signed)
DAILY NOTE   07/12/2016        Total Treatment (billable)Time: 40  Total Timed Minutes:  40 Visit Number:  5      Payor: AETNA / Plan: AETNA HMO / Product Type: *No Product type* /    # of Authorized Visits: 12 Visit #: 5      Diagnosis (Treating/Medical):     ICD-10-CM    1. Cervical pain M54.2    2. 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 Muscle weakness M62.81            Subjective:  Kathy reports that he did not have increased pain after the last session but also notes no improvement. He has most of his pain when turning his head to check his blind spot while driving.   Functional Status:  See above    Objective:   Treatment:see above  Therapeutic Exercise: to improve: Flexibility/ROM, Stabilization and Strength   Warm-up:  UBE with subjective obtained to assist with today's treatment session   Modifications/Patient Education:  Verbal, Visual and Tactile cues with sun salute to keep his chin tucked and take the back of his arms to the wall.   ROM/Manual Stretches L upper trap stretch    NMR verbal & tactile cues to facilitate TrA recruitment w/ plinth push ups and to avoid allowing hips to drop           verbal and tactile cueing to avoid upper trap compensation and abnormal scapulohumeral rhythm during prone strengthening     Therapeutic Activities:          Manual Therapy:        supine -   STM to UT and lev scap, SCM and subocciptals            Initial Evaluation Reference and/orCurrent Measurements (ROM, Strength, Girth, Outcomes, etc.):         AROM: Cervical Spine (deg) Initial      10/23 10/31   Flexion 66    70   Extension 45    50     Initial L Initial R  L/R   Sidebend 35 35  35/35   Rotation 25 40  B 50  46/65   (blank fields were intentionally left blank)     Repeated flexion/extension centralizes symptoms? No     Initial    STRENGTH: Cervical MMT            /5 Current  Current    4   C 1/2 Neck Flex       4   Neck Ext       Initial L Initial R   Current L Current R   5 5 C3 Neck Sidebend       5 5  Neck Rotation       5 5 Shoulder Flex       5 5 C5 Shoulder Abd       5 5 C6 Biceps       5 5 C7 Triceps       5 5 C7 Wrist Flex       5 5 C7 Wrist Ext       4 4 C4 Mid Trap       4 4 C4 Low Trap       5 5 Serratus       5 5 Shoulder IR       5 5 Shoulder ER       (blank  fields were intentionally left blank)         Modalities: None  Therapy Rationale: Other: ---       Assessment (response to treatment):   Pt with continued restrictions with c/s rot noted with demonstration when presenting to the session today. He needed verbal cues finning to keep his elbows in at his side. He also needed verbal cues with lateral wall walks to keep his elbows straight. He denies increased pain at the end of the session.   Progress towards functional goals:-    Patient requires continued skilled care to: improve neck and thoracic    Plan:  Continue with Plan of Care - assess MMT the next session.   Elliot Dally, LPTA    07/12/2016       Information below copied from evaluation as reference information unless noted/dated on the goal grid:      Functional Limitations (PLOF): IE  unable to turn head to the L secondary to pain to check blind spot (able no difficulty), woken up 2-3x a night secondary to neck pain (able no problem), unable to sit upright secondary to cervical-thoracic pain (able no difficulty)      Plan Of Care: Body Mechanics Education and NMR     Frequency/Duration: 2 times a week for 10 sessions.        Certification Status Ends: 08/12/2016     Goals:  Date (Body Area, Impairment Goal, Functional   Activity, Target Performance) Time Frame Status Date/  Initial   06/14/16   Increase active cervical rotation to 55 degrees for increased visual field to allow safe changing lanes and backing up vehicle while driving.  10 sessions 50 deg  07/02/16 ms    06/14/16   Increase mid-trap/lower trap strength 5/5 for maintenance of proper sitting posture needed for computer use > 6 hours.  10 sessions      06/14/16   Demonstrate  proper posture and body mechanics with sleeping position to allow patient to sleep undisturbed > 7 hours.  10 sessions      06/14/16   Patient will demonstrate independence in prescribed HEP with proper form, sets and reps for safe discharge to an independent program.  10 sessions Reports compliance 07/10/16  MSP

## 2016-07-17 ENCOUNTER — Inpatient Hospital Stay: Payer: Commercial Managed Care - HMO | Attending: Orthopaedic Surgery

## 2016-07-17 DIAGNOSIS — M1288 Other specific arthropathies, not elsewhere classified, other specified site: Secondary | ICD-10-CM | POA: Insufficient documentation

## 2016-07-17 DIAGNOSIS — M542 Cervicalgia: Secondary | ICD-10-CM | POA: Insufficient documentation

## 2016-07-17 DIAGNOSIS — M6281 Muscle weakness (generalized): Secondary | ICD-10-CM | POA: Insufficient documentation

## 2016-07-17 DIAGNOSIS — M47812 Spondylosis without myelopathy or radiculopathy, cervical region: Secondary | ICD-10-CM

## 2016-07-17 NOTE — PT/OT Exercise Plan (Signed)
Name: Neil Frederick  Referring Physician: Abe People, MD  Diagnosis:     ICD-10-CM    1. Cervical pain M54.2    2. 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 Muscle weakness M62.81         Precautions:  suprachorioid cyst  Date of Surgery:   NA MD Follow-up: PRN after PT          Exercise Flow Sheet    Exercise Specifics 06/14/16 10/23 10/25  10/31 07/12/16   11/7          UBE  -- 2 min fwd bwd ms 3 min fwd 3 min bwd ms  3'/3'  60RPM  MSP 3'3'  60 rpm  aw   3'/3'  60 RPM  MSP        Scapular squeeze    10 x 10"  MSP Review 10 cnt 1x5 ms    Wall push ups --- -- -- 10 x 10  MSP        Chin tuck supine    10 x 10"  MSP Seated w/ overpressure   10 cnt 1x10 ms ---- -- -- ---        Supine cervical rotation    10 x 10"  MSP Prone prop - c/s retraction w/ scap depression 10 cnt 1x10 ms 10 cnt 1x10 ms -- -- Prone W  10 x 3  MSP          Prone W  -- 10 cnt 1x10 ms 10 cnt 1x10 ms 10 x 10  MSP 10''x10  aw Prone I  10 x 10  MSP        Chint uck into ball    -- --- 10 cnt   1 x10 ms -- Lateral walk on wall   RTB x2   aw --        DWS    -- 30 sec x3 ms  DWS 30 sec x3 HEP  ms    sunsalute 1x10 ms  sunsalute on 1/2 foam  2'  MSP 2' on 1/2 foam  aw Sun sal at wall  10 x 2  MSP        1/2 roll      Tin man, H abd   1x20 each ms   1x20 ms H abd GTB  10 x 3  1/2 foam  MSP 3x10  1/2 foam  aw finning  RTB  10 x 2  MSP              H abd w/ c/s rot YTB alt 1x15 ms Plinth push ups  10 x 2  MSP 2x10  aw Waffle maker  RTB  10 x 2  MSP        mechanics      Bed mobility  Sleep posture, sitting     ms  GTB  PD  And   Gator  GTB  10 x 3  MSP GTB PD  3x10  aw DWS  30" x 3  MSP                finning   GTB  x20  aw ---        Home Exercise Program    Issued  MSP  Updated ms            (Initials = supervised exercise by clinician)      Access Code: [email or web address]   URL: [email or web address]   Date: 07/04/2016  Prepared by: Robynn Pane     Exercises   Supine Cervical Rotation AROM on Pillow - 10 reps - 3 sets - 3 hold - 2x daily -  7x weekly   Standing Scapular Retraction - 10 reps - 3 sets - 3 hold - 2x daily - 7x weekly   Supine Cervical Retraction with Towel - 10 reps - 3 sets - 3 hold - 2x daily - 7x weekly   Doorway Pec Stretch at 90 Degrees Abduction - 1 reps - 3 sets - 30 hold - 2x daily - 5x weekly

## 2016-07-17 NOTE — PT/OT Plan of Care (Signed)
PHYSICAL THERAPY PROGRESS REPORT  Referring Physician: Abe People, MD # of visits seen: 7 Dates of Treatment: 06/14/2016  - 01-Aug-2016  Diagnosis/Codes (Treating/Medical): Diagnosis:     ICD-10-CM    1. Cervical pain M54.2    2. 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 Muscle weakness M62.81        SUBJECTIVE:Neil Frederick reports that he does not notice a difference in his neck stiffness since beginning PT. Pt states that he continues to have stiffness in the morning. Pt denies pain upon arrival      OBJECTIVE:    AROM: Cervical Spine (deg) Initial      10/23 10/31 11/07    Flexion 66    70 70   Extension 45    50 50     Initial L Initial R  L/R    Sidebend 35 35  35/35 35/40   Rotation 25 40  B 50  46/65 34/50   (blank fields were intentionally left blank)     Repeated flexion/extension centralizes symptoms? No     Initial    STRENGTH: Cervical MMT            /5 Current  02-Aug-2023  Current  08/02/2023    4   C 1/2 Neck Flex  5     4   Neck Ext  5     Initial L Initial R   Current L Current R   5 5 C3 Neck Sidebend  5  5   5 5  Neck Rotation  5  5   5 5  Shoulder Flex  5  5   5 5  C5 Shoulder Abd  5  5   5 5  C6 Biceps  5  5   5 5  C7 Triceps  5  5   5 5  C7 Wrist Flex  5  5   5 5  C7 Wrist Ext  5  5   4 4  C4 Mid Trap  5  5   4 4  C4 Low Trap  5  5   5 5  Serratus  5  5   5 5  Shoulder IR  5  5   5 5  Shoulder ER  5  5   (blank fields were intentionally left blank)    Goals:  Date (Body Area, Impairment Goal, Functional   Activity, Target Performance) Time Frame Status Date/  Initial   06/14/16   Increase active cervical rotation to 55 degrees for increased visual field to allow safe changing lanes and backing up vehicle while driving.  10 sessions 34 deg, regressed this session.   08-01-2016  MSP   06/14/16   Increase mid-trap/lower trap strength 5/5 for maintenance of proper sitting posture needed for computer use > 6 hours.  10 sessions Goal met 2023/08/02  [facility]   06/14/16   Demonstrate proper posture and body mechanics with sleeping position to  allow patient to sleep undisturbed > 7 hours.  10 sessions Goal ; but awakens with stiffness 2023/08/02  [facility]   06/14/16   Patient will demonstrate independence in prescribed HEP with proper form, sets and reps for safe discharge to an independent program.  10 sessions Reports compliance 07/10/16  MSP     ASSESSMENT:  Pt continues to demonstrate fluctuating ROM through cervical spine this session however presents with [facility] through MMT. Pt reports that he continues to have no pain throughout the day but does continue to have no change in neck pain secondary to having  stiffness in his neck in the morning. Pt has been compliant with HEP and ergonomics with sitting and sleeping; however continues with reports stiffness. Bony end feels with mobility through cervical spine with lateral flexion glides. Discussion with patient over nearing end of physical therapy secondary to reporting no change in his neck functional mobility since beginning PT, and his (I) in therex this session. Pt is to follow up with MD on Thursday. Will follow up with PT after MD follow up for plan of care. Pt demonstrates no pain throughout session; cervicothoracic mobility continues to be limited.     PLAN: Continue with Plan of Care. May be appropriate to [place] pt secondary to small improvements with PT intervention and pt's (I) in his HEP at next session. Pt is to follow up with MD before.     Please sign and return this document if in agreement. Thank you for this referral.     Signature: [name] PT, DPT  Date: 07/17/2016    ____   ?I agree with the above plan.   ?  ____   Please make these modifications to the above plan:____________________    ___________________________________________________________________         M.D. Signature / Date: Abe People, MD _________________________      Name: Neil Frederick  DOB: 02/06/1951   Diagnosis/Codes (Treating/Medical): Diagnosis:   Encounter Diagnoses   Name Primary?   . Cervical pain    . Facet  arthropathy, cervical    . Muscle weakness

## 2016-07-17 NOTE — PT/OT Therapy Note (Signed)
DAILY NOTE   07/17/2016        Total Treatment (billable)Time: 40  Total Timed Minutes:  40 Visit Number:  7      Payor: AETNA / Plan: AETNA HMO / Product Type: *No Product type* /    # of Authorized Visits: 12 Visit #: 6      Diagnosis (Treating/Medical):     ICD-10-CM    1. Cervical pain M54.2    2. 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 Muscle weakness M62.81        Subjective:  Neil Frederick reports that he does not notice a difference in his neck stiffness since beginning PT. Pt states that he continues to have stiffness in the morning. Pt denies pain upon arrival  Functional Status:  See above    Objective:   Treatment:see above  Therapeutic Exercise: to improve: Flexibility/ROM, Stabilization and Strength   Warm-up:  UBE with subjective obtained to assist with today's treatment session   Modifications/Patient Education:  Continued Verbal, Visual and Tactile cues with sun salute to keep his chin tucked and take the back of his arms to the wall.   ROM/Manual Stretches L upper trap stretch    NMR verbal & tactile cues to facilitate TrA recruitment w/ TB disassociation of flexion/extension   Verbal and tactile cues with disassociation of cervical rotation and horizontal abudction           verbal  and tactile cueing to avoid upper trap compensation and abnormal scapulohumeral rhythm during prone strengthening     Therapeutic Activities:  ---    Manual Therapy:        supine -   STM to UT and lev scap, SCM and subocciptals    Supine, facet glides GIII   Prone: PIVM cervical thoracic mobility            Initial Evaluation Reference and/orCurrent Measurements (ROM, Strength, Girth, Outcomes, etc.):         AROM: Cervical Spine (deg) Initial      10/23 10/31 11/07    Flexion 66    70 70   Extension 45    50 50     Initial L Initial R  L/R    Sidebend 35 35  35/35 35/40   Rotation 25 40  B 50  46/65 34/50   (blank fields were intentionally left blank)     Repeated flexion/extension centralizes symptoms? No     Initial     STRENGTH: Cervical MMT            /5 Current  11/7  Current  11/7    4   C 1/2 Neck Flex  5     4   Neck Ext  5     Initial L Initial R   Current L Current R   5 5 C3 Neck Sidebend  5  5   5 5  Neck Rotation  5  5   5 5  Shoulder Flex  5  5   5 5  C5 Shoulder Abd  5  5   5 5  C6 Biceps  5  5   5 5  C7 Triceps  5  5   5 5  C7 Wrist Flex  5  5   5 5  C7 Wrist Ext  5  5   4 4  C4 Mid Trap  5  5   4 4  C4 Low Trap  5  5   5 5  Serratus  5  5  5 5 Shoulder IR  5  5   5 5  Shoulder ER  5  5   (blank fields were intentionally left blank)    Modalities: None  Therapy Rationale: Other: ---     Assessment (response to treatment):   Pt continues to demonstrate fluctuating ROM through cervical spine this session however presents with [facility] through MMT. Pt reports that he continues to have no pain throughout the day but does continue to have no change in neck pain secondary to having stiffness in his neck in the morning. Pt has been compliant with HEP and ergonomics with sitting and sleeping; however continues with reports stiffness. Bony end feels with mobility through cervical spine with lateral flexion glides. Discussion with patient over nearing end of physical therapy secondary to reporting no change in his neck functional mobility since beginning PT, and his (I) in therex this session. Pt is to follow up with MD on Thursday. Will follow up with PT after MD follow up for plan of care. Pt demonstrates no pain throughout session; cervicothoracic mobility continues to be limited.     Progress towards functional goals:  Patient requires continued skilled care to: improve neck and thoracic    Plan:  Continue with Plan of Care . Follow up after MD appointment.   [name] PT, DPT      07/17/2016       Information below copied from evaluation as reference information unless noted/dated on the goal grid:      Functional Limitations (PLOF): IE  unable to turn head to the L secondary to pain to check blind spot (able no difficulty),  woken up 2-3x a night secondary to neck pain (able no problem), unable to sit upright secondary to cervical-thoracic pain (able no difficulty)      Plan Of Care: Body Mechanics Education and NMR     Frequency/Duration: 2 times a week for 10 sessions.        Certification Status Ends: 08/12/2016     Goals:  Date (Body Area, Impairment Goal, Functional   Activity, Target Performance) Time Frame Status Date/  Initial   06/14/16   Increase active cervical rotation to 55 degrees for increased visual field to allow safe changing lanes and backing up vehicle while driving.  10 sessions 34 deg, regressed this session.   07/17/2016  MSP   06/14/16   Increase mid-trap/lower trap strength 5/5 for maintenance of proper sitting posture needed for computer use > 6 hours.  10 sessions Goal met 11/7  [facility]   06/14/16   Demonstrate proper posture and body mechanics with sleeping position to allow patient to sleep undisturbed > 7 hours.  10 sessions Goal ; but awakens with stiffness 11/7  [facility]   06/14/16   Patient will demonstrate independence in prescribed HEP with proper form, sets and reps for safe discharge to an independent program.  10 sessions Reports compliance 07/10/16  MSP

## 2016-07-19 ENCOUNTER — Inpatient Hospital Stay: Payer: Commercial Managed Care - HMO | Attending: Orthopaedic Surgery

## 2016-07-19 ENCOUNTER — Other Ambulatory Visit: Payer: Self-pay | Admitting: Orthopaedic Surgery

## 2016-07-19 DIAGNOSIS — M6281 Muscle weakness (generalized): Secondary | ICD-10-CM | POA: Insufficient documentation

## 2016-07-19 DIAGNOSIS — M47812 Spondylosis without myelopathy or radiculopathy, cervical region: Secondary | ICD-10-CM

## 2016-07-19 DIAGNOSIS — M542 Cervicalgia: Secondary | ICD-10-CM | POA: Insufficient documentation

## 2016-07-19 DIAGNOSIS — M47892 Other spondylosis, cervical region: Secondary | ICD-10-CM

## 2016-07-19 DIAGNOSIS — M1288 Other specific arthropathies, not elsewhere classified, other specified site: Secondary | ICD-10-CM | POA: Insufficient documentation

## 2016-07-19 NOTE — PT/OT Exercise Plan (Signed)
Name: Neil Frederick  Referring Physician: Abe People, MD  Diagnosis:     ICD-10-CM    1. Cervical pain M54.2    2. 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 Muscle weakness M62.81         Precautions:  suprachorioid cyst  Date of Surgery:   NA MD Follow-up: PRN after PT          Exercise Flow Sheet    Exercise Specifics 06/14/16 10/23 10/25  10/31 07/12/16   11/7 11/9         UBE  -- 2 min fwd bwd ms 3 min fwd 3 min bwd ms  3'/3'  60RPM  MSP 3'3'  60 rpm  aw   3'/3'  60 RPM  MSP 3'/3'  60RPM  MSP       Scapular squeeze    10 x 10"  MSP Review 10 cnt 1x5 ms    Wall push ups --- -- -- 10 x 10  MSP 10 x 10  MSP       Chin tuck supine    10 x 10"  MSP Seated w/ overpressure   10 cnt 1x10 ms ---- -- -- --- 10 x 10  MSP       Supine cervical rotation    10 x 10"  MSP Prone prop - c/s retraction w/ scap depression 10 cnt 1x10 ms 10 cnt 1x10 ms -- -- Prone W  10 x 3  MSP --         Prone W  -- 10 cnt 1x10 ms 10 cnt 1x10 ms 10 x 10  MSP 10''x10  aw Prone I  10 x 10  MSP --       Chint uck into ball    -- --- 10 cnt   1 x10 ms -- Lateral walk on wall   RTB x2   aw -- --       DWS    -- 30 sec x3 ms  DWS 30 sec x3 HEP  ms    sunsalute 1x10 ms  sunsalute on 1/2 foam  2'  MSP 2' on 1/2 foam  aw Sun sal at wall  10 x 2  MSP --       1/2 roll      Tin man, H abd   1x20 each ms   1x20 ms H abd GTB  10 x 3  1/2 foam  MSP 3x10  1/2 foam  aw finning  RTB  10 x 2  MSP GTB  15x  MSP             H abd w/ c/s rot YTB alt 1x15 ms Plinth push ups  10 x 2  MSP 2x10  aw Waffle maker  RTB  10 x 2  MSP --       mechanics      Bed mobility  Sleep posture, sitting     ms  GTB  PD  And   Gator  GTB  10 x 3  MSP GTB PD  3x10  aw DWS  30" x 3  MSP --               finning   GTB  x20  aw --- TB row/PD  GTB  10 x 2  MSP       Home Exercise Program    Issued  MSP  Updated ms     Issued new  MSP       (  Initials = supervised exercise by clinician)      Access Code: [email or web address]   URL: [email or web address]   Date: 07/04/2016   Prepared by:  Robynn Pane     Exercises   Supine Cervical Rotation AROM on Pillow - 10 reps - 3 sets - 3 hold - 2x daily - 7x weekly   Standing Scapular Retraction - 10 reps - 3 sets - 3 hold - 2x daily - 7x weekly   Supine Cervical Retraction with Towel - 10 reps - 3 sets - 3 hold - 2x daily - 7x weekly   Doorway Pec Stretch at 90 Degrees Abduction - 1 reps - 3 sets - 30 hold - 2x daily - 5x weekly

## 2016-07-19 NOTE — PT/OT Plan of Care (Signed)
Discharge Status  [facility] Medicare Provider #: [phone]                Name: GORDEN STTHOMAS Age: 65 y.o. 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     ASSESSMENT: the patient is a 65 y.o. male presenting with cervicalgia who completed Physical Therapy for the following:  Impairments: (+) cervical pain L>R, facet dysfuction, deep neck cervial weakness, periscapular weakness, poor posturing, muscle spasms through UT/LS/SCM        ICD-10-CM    1. Cervical pain M54.2    2. 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 Muscle weakness M62.81      OBJECTIVE:  AROM: Cervical Spine (deg) Initial      10/23 10/31 11/07    Flexion 66    70 70   Extension 45    50 50     Initial L Initial R  L/R    Sidebend 35 35  35/35 35/40   Rotation 25 40  B 50  46/65 34/50   (blank fields were intentionally left blank)     Repeated flexion/extension centralizes symptoms? No     Initial    STRENGTH: Cervical MMT            /5 Current  11/7  Current  11/7    4   C 1/2 Neck Flex  5     4   Neck Ext  5     Initial L Initial R   Current L Current R   5 5 C3 Neck Sidebend  5  5   5 5  Neck Rotation  5  5   5 5  Shoulder Flex  5  5   5 5  C5 Shoulder Abd  5  5   5 5  C6 Biceps  5  5   5 5  C7 Triceps  5  5   5 5  C7 Wrist Flex  5  5   5 5  C7 Wrist Ext  5  5   4 4  C4 Mid Trap  5  5   4 4  C4 Low Trap  5  5   5 5  Serratus  5  5   5 5  Shoulder IR  5  5   5 5  Shoulder ER  5  5   (blank fields were intentionally left blank)    Goals:  Date (Body Area, Impairment Goal, Functional   Activity, Target Performance) Time Frame Status Date/  Initial   06/14/16   Increase active cervical rotation to 55 degrees for increased visual field to allow safe changing lanes and backing up vehicle while driving.  10 sessions 34 deg,  regressed this session.   07/17/2016  MSP   06/14/16   Increase mid-trap/lower trap strength 5/5 for maintenance of proper sitting posture needed for computer use > 6 hours.  10 sessions Goal met 11/7  [facility]   06/14/16   Demonstrate proper posture and body mechanics with sleeping position to allow patient to sleep undisturbed > 7 hours.  10 sessions Goal ; but awakens with stiffness 11/7  [facility]   06/14/16   Patient will demonstrate independence in prescribed HEP with proper form, sets and reps for safe discharge to an independent program.  10 sessions Reports  compliance 07/10/16  MSP       PLAN: [place] to HEP. Plateau in progress. Pt to obtain MRI per MD.     Outcomes: Eval: Outcome Measure:   NDI Score: 10% Pain Score: 70% Rate Satisfaction with Current Function: 3/10            07/19/2016: Outcome Measure:   NDI Score: 16% Pain Score: 63% Rate Satisfaction with Current Function: 5/10       Comments: Pt continues to demonstrate fluctuating ROM through cervical spine this session however presents with [facility] through MMT. Pt reports that he continues to have no pain throughout the day but does continue to have no change in neck pain secondary to having stiffness in his neck in the morning. Pt has been compliant with HEP and ergonomics with sitting and sleeping; however continues with reports stiffness. Bony end feels with mobility through cervical spine with lateral flexion glides. Pt is currently at a plateau with physical therapy and will be [place] to HEP. Pt encouraged to continue with HEP and ergonomics to keep pain at bay as pain has not gotten worse. Reviewed full HEP program with education/instruction on how to progress exercises with sets, reps, hold times, and resistance to promote continued improvement for flexibility/strength/balance.  Special attention given to instruct patient to not push HEP to the point of pain and to contact our office if questions occur.      Recommendations:   Discharge / Discontinue  Physical/Occupational Therapy.  Reason for D/C:  Plateau in progress      Name: GENARO BEKKER  DOB: Aug 20, 1951   Diagnosis/Codes (Treating/Medical): Diagnosis:   Encounter Diagnoses   Name Primary?   . Cervical pain    . Facet arthropathy, cervical    . Muscle weakness

## 2016-07-19 NOTE — PT/OT Therapy Note (Signed)
DAILY NOTE   07/19/2016        Total Treatment (billable)Time: 40  Total Timed Minutes:  40 Visit Number:  7      Payor: AET[employer] / Plan: [employer] HMO / Product Type: *No Product type* /    # of Authorized Visits: 12 Visit #: 7      Diagnosis (Treating/Medical):     ICD-10-CM    1. Cervical pain M54.2    2. 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 Muscle weakness M62.81        Subjective:  Neil Frederick reports that he does not notice a difference in his neck stiffness since beginning PT. Pt states that he continues to have stiffness in the morning. Pt denies pain upon arrival. Pt states that he had a follow up with his MD and states that he agreed with PT progress note. Pt is scheduled to receive an MRI for possible injection in December.   Functional Status:  See above    Objective:   Treatment:see above  Therapeutic Exercise: to improve: Flexibility/ROM, Stabilization and Strength   Warm-up:  UBE with subjective obtained to assist with today's treatment session   Modifications/Patient Education:  Continued Verbal, Visual and Tactile cues with sun salute to keep his chin tucked and take the back of his arms to the wall.   ROM/Manual Stretches L upper trap stretch  Review of HEP for [employer]    NMR ---    Therapeutic Activities:  ---    Manual Therapy:        supine -   STM to UT and lev scap, SCM and subocciptals    Supine, facet glides GIII   Prone: PIVM cervical thoracic mobility            Initial Evaluation Reference and/orCurrent Measurements (ROM, Strength, Girth, Outcomes, etc.):         AROM: Cervical Spine (deg) Initial      10/23 10/31 11/07    Flexion 66    70 70   Extension 45    50 50     Initial L Initial R  L/R    Sidebend 35 35  35/35 35/40   Rotation 25 40  B 50  46/65 34/50   (blank fields were intentionally left blank)     Repeated flexion/extension centralizes symptoms? No     Initial    STRENGTH: Cervical MMT            /5 Current  11/7  Current  11/7    4   C 1/2 Neck Flex  5     4   Neck Ext  5     Initial L  Initial R   Current L Current R   5 5 C3 Neck Sidebend  5  5   5 5  Neck Rotation  5  5   5 5  Shoulder Flex  5  5   5 5  C5 Shoulder Abd  5  5   5 5  C6 Biceps  5  5   5 5  C7 Triceps  5  5   5 5  C7 Wrist Flex  5  5   5 5  C7 Wrist Ext  5  5   4 4  C4 Mid Trap  5  5   4 4  C4 Low Trap  5  5   5 5  Serratus  5  5   5 5  Shoulder IR  5  5   5 5  Shoulder ER  5  5   (blank fields were intentionally left blank)    Modalities: None  Therapy Rationale: Other: ---     Assessment (response to treatment):   Pt continues to demonstrate fluctuating ROM through cervical spine this session however presents with Neil Frederick through MMT. Pt reports that he continues to have no pain throughout the day but does continue to have no change in neck pain secondary to having stiffness in his neck in the morning. Pt has been compliant with HEP and ergonomics with sitting and sleeping; however continues with reports stiffness. Bony end feels with mobility through cervical spine with lateral flexion glides. Pt is currently at a plateau with physical therapy and will be Neil Frederick to HEP. Pt encouraged to continue with HEP and ergonomics to keep pain at bay as pain has not gotten worse. Reviewed full HEP program with education/instruction on how to progress exercises with sets, reps, hold times, and resistance to promote continued improvement for flexibility/strength/balance.  Special attention given to instruct patient to not push HEP to the point of pain and to contact our office if questions occur.    Progress towards functional goals:  Patient requires continued skilled care to: improve neck and thoracic    Plan:  [place] to HEP    Neil Frederick PT, DPT      07/19/2016       Information below copied from evaluation as reference information unless noted/dated on the goal grid:      Functional Limitations (PLOF): IE  unable to turn head to the L secondary to pain to check blind spot (able no difficulty), woken up 2-3x a night secondary to neck pain (able no  problem), unable to sit upright secondary to cervical-thoracic pain (able no difficulty)      Plan Of Care: Body Mechanics Education and NMR     Frequency/Duration: 2 times a week for 10 sessions.        Certification Status Ends: 08/12/2016     Goals:  Date (Body Area, Impairment Goal, Functional   Activity, Target Performance) Time Frame Status Date/  Initial   06/14/16   Increase active cervical rotation to 55 degrees for increased visual field to allow safe changing lanes and backing up vehicle while driving.  10 sessions 34 deg, regressed this session.   07/17/2016  MSP   06/14/16   Increase mid-trap/lower trap strength 5/5 for maintenance of proper sitting posture needed for computer use > 6 hours.  10 sessions Goal met 11/7  [facility]   06/14/16   Demonstrate proper posture and body mechanics with sleeping position to allow patient to sleep undisturbed > 7 hours.  10 sessions Goal ; but awakens with stiffness 11/7  [facility]   06/14/16   Patient will demonstrate independence in prescribed HEP with proper form, sets and reps for safe discharge to an independent program.  10 sessions Reports compliance 07/10/16  MSP

## 2016-07-24 ENCOUNTER — Inpatient Hospital Stay: Payer: Commercial Managed Care - HMO

## 2016-07-26 ENCOUNTER — Inpatient Hospital Stay: Payer: Commercial Managed Care - HMO

## 2016-07-28 ENCOUNTER — Ambulatory Visit
Admission: RE | Admit: 2016-07-28 | Discharge: 2016-07-28 | Disposition: A | Discharge status: Home / Self Care | Payer: Commercial Managed Care - HMO | Source: Ambulatory Visit | Attending: Orthopaedic Surgery | Admitting: Orthopaedic Surgery

## 2016-07-28 DIAGNOSIS — M50323 Other cervical disc degeneration at C6-C7 level: Secondary | ICD-10-CM | POA: Insufficient documentation

## 2016-07-28 DIAGNOSIS — M4802 Spinal stenosis, cervical region: Secondary | ICD-10-CM | POA: Insufficient documentation

## 2016-07-28 DIAGNOSIS — M542 Cervicalgia: Secondary | ICD-10-CM

## 2016-07-28 DIAGNOSIS — M47812 Spondylosis without myelopathy or radiculopathy, cervical region: Secondary | ICD-10-CM | POA: Insufficient documentation

## 2016-07-28 DIAGNOSIS — M47892 Other spondylosis, cervical region: Secondary | ICD-10-CM

## 2016-07-31 ENCOUNTER — Inpatient Hospital Stay: Payer: Commercial Managed Care - HMO

## 2016-11-19 ENCOUNTER — Other Ambulatory Visit: Payer: Self-pay | Admitting: Orthopaedic Surgery

## 2016-11-19 DIAGNOSIS — S86311A Strain of muscle(s) and tendon(s) of peroneal muscle group at lower leg level, right leg, initial encounter: Secondary | ICD-10-CM

## 2016-11-29 ENCOUNTER — Ambulatory Visit: Payer: Commercial Managed Care - HMO

## 2016-12-08 ENCOUNTER — Ambulatory Visit
Admission: RE | Admit: 2016-12-08 | Discharge: 2016-12-08 | Disposition: A | Discharge status: Home / Self Care | Payer: Commercial Managed Care - HMO | Source: Ambulatory Visit | Attending: Orthopaedic Surgery | Admitting: Orthopaedic Surgery

## 2016-12-08 ENCOUNTER — Ambulatory Visit: Payer: Commercial Managed Care - HMO

## 2016-12-08 DIAGNOSIS — M19071 Primary osteoarthritis, right ankle and foot: Secondary | ICD-10-CM | POA: Insufficient documentation

## 2016-12-08 DIAGNOSIS — M65871 Other synovitis and tenosynovitis, right ankle and foot: Secondary | ICD-10-CM | POA: Insufficient documentation

## 2016-12-08 DIAGNOSIS — M24271 Disorder of ligament, right ankle: Secondary | ICD-10-CM | POA: Insufficient documentation

## 2016-12-08 DIAGNOSIS — S86311A Strain of muscle(s) and tendon(s) of peroneal muscle group at lower leg level, right leg, initial encounter: Secondary | ICD-10-CM

## 2016-12-08 DIAGNOSIS — R600 Localized edema: Secondary | ICD-10-CM | POA: Insufficient documentation

## 2016-12-08 DIAGNOSIS — M79671 Pain in right foot: Secondary | ICD-10-CM | POA: Insufficient documentation

## 2016-12-08 DIAGNOSIS — M7989 Other specified soft tissue disorders: Secondary | ICD-10-CM | POA: Insufficient documentation

## 2017-04-04 ENCOUNTER — Inpatient Hospital Stay
Payer: Commercial Managed Care - HMO | Attending: Orthopaedic Surgery | Admitting: Rehabilitative and Restorative Service Providers"

## 2017-04-04 ENCOUNTER — Encounter: Payer: Self-pay | Admitting: Rehabilitative and Restorative Service Providers"

## 2017-04-04 DIAGNOSIS — M25552 Pain in left hip: Secondary | ICD-10-CM | POA: Insufficient documentation

## 2017-04-04 NOTE — Progress Notes (Signed)
Name: Neil Frederick Age: 66 y.o.   Referring Physician: Abe People, MD   Date of Injury: 12/05/2016  Date Care Plan Established/Reviewed: 04/04/2017  Date Treatment Started: 04/04/2017  Visit Count: 1   Diagnosis:   1. Left hip pain        Subjective     History of Present Illness   History of Present Illness: March 2018 tore ligaments in the R ankle was in boot and cast for several weeks with crutches. Noted increase pain on the L side hip. Pain progressively worse after sitting for extended periods and getting up to walk. Saw ortho 03/15/17 and was given a injection in the hip and medication for anti inflammatories. Was then referred to PT. Pt states about 3 weeks ago started to try and play tennis. Had no pain while playing after he was warmed up but pain increased by the end. Denies N/T. History of B quad tendon repair 2009.   Functional Limitations (PLOF): Difficulty walking after sitting extended time (no issue), difficulty laying on the L side due to pain (no issue)    Outcome Measure   Tool Used/Details: FOTO  Score: 60  Predicted Functional Outcome: 71    Pain   Current pain rating: 5  At best pain rating: 5  At worst pain rating: 5  Patient Satisfaction with Current Function: 5  Location: L hip    Social Support/Occupation  Lives in: multiple level home  Lives with: alone  Occupation: Scientific laboratory technician   Sit to Stand: independent    Gait   Left Side: decreased hip extension decreased knee flexion with swing phase  Right Side: decreased hip extension    Integumentary   no wound, lesion or rash noted    Range of Motion     04/04/17   Left AROM    Left PROM   Lumbar/Hip 04/04/17   Right AROM    Right PROM       Lumbar Rotation       113    Hip Flexion 119      0    Hip Extension 6      32    Hip Abduction 32          Hip Adduction       28    Hip IR 25      35    Hip ER 45      (blank fields were intentionally left blank)          Strength     04/04/17   Left Strength  Hip  MMT  04/04/17   Right   5  Hip Flexion 5    4  Hip Extension 4    4  Hip Abduction 4      Hip Adduction     5  Hip IR 5    5  Hip ER 5    5  Quadriceps 5    5  Hamstrings 5    (blank fields were intentionally left blank)    04/04/17   Left Strength  Ankle/Foot  MMT 04/04/17   Right   5  Ankle Dorsiflexion 5      Ankle Plantarflexion       Ankle Inversion       Ankle Eversion     (blank fields were intentionally left blank)    Palpation TTP along the L greater trochanter along the  inferior posterior portion    Tests     Left Hip   Positive piriformis.   Negative scour.   Thomas: Positive.     Right Hip   Positive piriformis.   Negative scour.   Thomas: Positive.              Treatment     Therapeutic Exercises   Instructed and given HEP        ---      ---   Total Time   Timed Minutes  10 minutes   Untimed Minutes  25 minutes   Total Time  35 minutes        Assessment   Carlos is a 66 y.o. male presenting with L hip pain who requires Physical Therapy for the following:  Impairments: decreased ROM, decreased strength, antalgic gait, TTP inferior posterior greater trochanter    Pain located: L hip    Clinical presentation: stable   Barriers to therapy: Past surgical history B quad tendon tears, also injured R ankle recently  Prior Level of Function: Difficulty walking after sitting extended time (no issue), difficulty laying on the L side due to pain (no issue)  Prognosis: good  Plan   Visits per week: 2  Number of Sessions: 12  Direct One on One  16109: Therapeutic Exercise: To Develop Strength and Endurance, ROM and Flexibility  [id number]: Gait Training  60454: Neuromuscular Reeducation  [phone]: Self Care/Home Mgmt Training (ADLs, safety procedures, use of assistive devices)  97140: Manual Therapy techniques (mobilization, manipulation, manual traction)  97530: Therapeutic Activities: Dynamic activities to improve functional performance  97035: Ultrasound  Supervised Modalities  97010: Thermal modalities: hot/cold packs  91478:  Electrical stimulation  Work on soft tissue around the hip, review HEP, work on hip strength as able      Goals    Goal 1:  Patient will demonstrate independence in prescribed HEP with proper form, sets and reps for safe discharge to an independent program.   Sessions:  12   Progression:  new      Goal 2:  Increase hip abduction and extensor strength 5/5 to allow for ambulation > 15 minutes after sitting for greater .    Sessions:  12   Progression:  new       Goal 3:  Increase hip ext to 5 degrees to improve gait and reduce stress along the posterior hip during gait   Sessions:  12   Progression:  new                                   [name], PT

## 2017-04-04 NOTE — PT/OT Therapy Note (Addendum)
Name: Neil Frederick  Referring Physician: Abe People, MD  Diagnosis:     ICD-10-CM    1. Left hip pain M25.552 External Physical Therapy Plan of Care        Precautions:  na Date of Surgery:   na MD Follow-up: na          Exercise Flow Sheet    Exercise Specifics                bike               Calf stretch                   Review HEP                 bridge                 clams                 Hip ext                 Hip abd                                                                                   Home Exercise Program                 (Initials = supervised exercise by clinician)  Access Code: [facility]   URL: [email or web address]   Date: 04/04/2017   Prepared by: [name]     Exercises  Supine ITB Stretch with Strap - 3 reps - 1 sets - 30sec hold - 2x daily - 7x weekly  Prone Quadriceps Stretch with Strap - 3 reps - 1 sets - 30sec hold                            - 2x daily - 7x weekly  Supine Piriformis Stretch Pulling Heel to Hip - 3 reps - 1 sets - 30sec hold - 2x daily - 7x weekly

## 2017-04-09 ENCOUNTER — Inpatient Hospital Stay: Payer: Commercial Managed Care - HMO | Attending: Orthopaedic Surgery

## 2017-04-09 DIAGNOSIS — M25552 Pain in left hip: Secondary | ICD-10-CM | POA: Insufficient documentation

## 2017-04-09 NOTE — PT/OT Therapy Note (Signed)
Name: Neil Frederick Age: 66 y.o.   Referring Physician: Abe People, MD   Date of Injury: 12/05/2016  Date Care Plan Established/Reviewed: 04/04/2017  Date Treatment Started: 04/04/2017  Visit Count: 2   Diagnosis:   1. Left hip pain        Subjective     Social Support/Occupation  Lives in: multiple level home  Lives with: alone  Occupation: accounting     Pt reports that he has not completed any of his HEP since IE and that he will start today. Pt reports pain in the same. No change.                 Treatment     Therapeutic Exercises   Justification: To improve hip and low back mobility, flexibility, and strength to reduce L hip pain  Recumbent bike with cueing for core engagement for warm up with subjective history gained    Review of HEP    Progression of HEP as per flow sheet    Neuromuscular Re-Education   Justification: To faciliate imrpoved motor control through lumbopelvic region   Tactile cueing with clamshells to reduce lumbar rocking and facilitate improved glute med strengthening    Tactile cueing with bridges for improve core control and PPT maintance through ROM to reduce lumbar extension     Manual Therapy   Justification: sidelying on R, L sid eup to reduce myofascial restrictions and reduce L hip pain.   STM through piriformis, ITB, TFL,     G3 framing around GT          Initial Evaluation Reference and/orCurrent Measurements (ROM, Strength, Girth, Outcomes, etc.):    OBJECTIVE:      Range of Motion      04/04/17    Left AROM      Left PROM    Lumbar/Hip 04/04/17    Right AROM      Right PROM   113       Hip Flexion 119         0       Hip Extension 6         32       Hip Abduction 32                 Hip Adduction           28       Hip IR 25         35       Hip ER 45         (blank fields were intentionally left blank)           Strength      04/04/17    Left Strength  Hip  MMT 04/04/17    Right   5   Hip Flexion 5     4   Hip Extension 4     4   Hip Abduction 4         Hip Adduction       5   Hip  IR 5     5   Hip ER 5     5   Quadriceps 5     5   Hamstrings 5     (blank fields were intentionally left blank)     04/04/17    Left Strength  Ankle/Foot  MMT 04/04/17    Right   5   Ankle Dorsiflexion 5  Ankle Plantarflexion           Ankle Inversion           Ankle Eversion       (blank fields were intentionally left blank)       ---      ---   Total Time   Timed Minutes  45 minutes   Total Time  45 minutes        Assessment   Pt tolerated progression of therex well this session but required tactile cueing for proper core control to reduce stress on low back. Education over importance of compliance with HEP in order to reach mutually established goals. Pt with point tenderness over superior lateral aspect of greater trochanter this session, no pain inferiorly.   Plan   Follow up on HEP. Measurements.       Goals    Goal 1:  Patient will demonstrate independence in prescribed HEP with proper form, sets and reps for safe discharge to an independent program.   Sessions:  12   Progression:  new      Goal 2:  Increase hip abduction and extensor strength 5/5 to allow for ambulation > 15 minutes after sitting for greater .    Sessions:  12   Progression:  new       Goal 3:  Increase hip ext to 5 degrees to improve gait and reduce stress along the posterior hip during gait   Sessions:  12   Progression:  new                                   [name], PT     Precautions:  na        Date of Surgery:        na        MD Follow-up: na                                                        Exercise Flow Sheet     Exercise Specifics  7/31      bike    6'  60RPM  MSP   Calf stretch       30" x 3  MSP    ITB  30" x 3  MSP      Review HEP   MSP       bridge   10 x 3  MSP       clams    RTB  15 x 2  MSP      Hip ext    10 x 3  MSP  #2      Hip abd    ----          piriformis  30" x 3  MSP            prone quad  30" x 3  MSP                         Home Exercise Program          (Initials = supervised exercise  by clinician)  Access Code: [facility]       URL: [email or web address]  Date: 04/04/2017   Prepared by: [name]      Exercises  Supine ITB Stretch with Strap - 3 reps - 1 sets - 30sec hold - 2x daily - 7x weekly  Prone Quadriceps Stretch with Strap - 3 reps - 1 sets - 30sec hold                                                                                                                                                                                                                                                                                                             - 2x daily - 7x weekly  Supine Piriformis Stretch Pulling Heel to Hip - 3 reps - 1 sets - 30sec hold - 2x daily - 7x weekly

## 2017-04-11 ENCOUNTER — Inpatient Hospital Stay
Payer: Commercial Managed Care - HMO | Attending: Orthopaedic Surgery | Admitting: Rehabilitative and Restorative Service Providers"

## 2017-04-11 DIAGNOSIS — M25552 Pain in left hip: Secondary | ICD-10-CM | POA: Insufficient documentation

## 2017-04-11 NOTE — PT/OT Therapy Note (Signed)
Name: Neil Frederick Age: 66 y.o.   Referring Physician: Abe People, MD   Date of Injury: 12/05/2016  Date Care Plan Established/Reviewed: 04/04/2017  Date Treatment Started: 04/04/2017  Visit Count: 3   Diagnosis:   1. Left hip pain        Subjective     Social Support/Occupation  Lives in: multiple level home  Lives with: alone  Occupation: accounting     Pt reports no change since last visit. Still has increased pain when getting up out of chairs. States walking does not increase his pain. States that the intensity of the pain has not changed when first getting up after sitting either.                       Treatment     Therapeutic Exercises   Justification: To improve hip and low back mobility, flexibility, and strength to reduce L hip pain  Recumbent bike with cueing for core engagement for warm up with subjective history gained    therex per flow sheet, reviewed stretches in sitting, added HEP    Manual Therapy   Justification: sidelying on R, L sid eup to reduce myofascial restrictions and reduce L hip pain.   STM gluts, piriformis, TFL, proximal quad          Initial Evaluation Reference and/orCurrent Measurements (ROM, Strength, Girth, Outcomes, etc.):    OBJECTIVE:      Range of Motion      04/04/17  04/11/17  Left AROM      Left PROM    Lumbar/Hip 04/04/17    Right AROM      Right PROM   113       Hip Flexion 119         0       Hip Extension 6         32       Hip Abduction 32                 Hip Adduction           28       Hip IR 25         35       Hip ER 45         (blank fields were intentionally left blank)           Strength      04/04/17    Left Strength  Hip  MMT 04/04/17    Right   5   Hip Flexion 5     4   Hip Extension 4     4   Hip Abduction 4         Hip Adduction       5   Hip IR 5     5   Hip ER 5     5   Quadriceps 5     5   Hamstrings 5     (blank fields were intentionally left blank)     04/04/17    Left Strength  Ankle/Foot  MMT 04/04/17    Right   5   Ankle Dorsiflexion 5         Ankle  Plantarflexion           Ankle Inversion           Ankle Eversion       (blank fields were intentionally left blank)       ---      ---  Total Time   Timed Minutes  44 minutes   Total Time  44 minutes        Assessment   Significant muscle tightness along the gluts and TFL region. Pt had some tenderness along the superior posterior greater tuberosity. Pt no c/o pain at the end of the visit. Reviewed HEP for sitting to allow   Plan   Continue with POC, f/u post treat tolerance, f/u on new HEP      Goals    Goal 1:  Patient will demonstrate independence in prescribed HEP with proper form, sets and reps for safe discharge to an independent program.   Sessions:  12   Progression:  new      Goal 2:  Increase hip abduction and extensor strength 5/5 to allow for ambulation > 15 minutes after sitting for greater .    Sessions:  12   Progression:  new       Goal 3:  Increase hip ext to 5 degrees to improve gait and reduce stress along the posterior hip during gait   Sessions:  12   Progression:  new                                   Neil Frederick, PT     Precautions:  na        Date of Surgery:        na        MD Follow-up: na                                                        Exercise Flow Sheet     Exercise Specifics  7/31 04/11/17        bike    6'  60RPM  MSP  DL     Calf stretch       30" x 3  MSP    ITB  30" x 3  MSP 3x30sec  DL      [id number]  DL        Review HEP   MSP  --        bridge   10 x 3  MSP  3x10  DL        clams    RTB  15 x 2  MSP GTB 3x10   DL        Hip ext    10 x 3  MSP  #2 3x10 ea  DL        Hip abd    ----             piriformis  30" x 3  MSP  3x30sec  DL             prone quad  30" x 3  MSP --                                 Home Exercise Program        Updated  DL     (Initials = supervised exercise by clinician)    Access Code: [facility]   URL: [email or web address]   Date: 04/11/2017   Prepared by:  [name]     Exercises   Supine ITB Stretch with Strap - 3 reps - 1  sets - 30sec hold - 2x daily - 7x weekly   Prone Quadriceps Stretch with Strap - 3 reps - 1 sets - 30sec hold - 2x daily - 7x weekly   Supine Piriformis Stretch Pulling Heel to Hip - 3 reps - 1 sets - 30sec hold - 2x daily - 7x weekly   Seated Piriformis Stretch - 3 reps - 1 sets - 30sec hold - 2x daily - 7x weekly   Seated Piriformis Stretch - 3 reps - 1 sets - 30sec hold - 2x daily - 7x weekly   ITB Stretch at Wall - 3 reps - 1 sets - 30sec hold - 2x daily - 7x weekly   Quadricep Stretch with Chair and Counter Support - 3 reps - 1 sets - 30sec hold - 2x daily - 7x weekly

## 2017-04-15 ENCOUNTER — Inpatient Hospital Stay: Payer: Commercial Managed Care - HMO | Attending: Orthopaedic Surgery

## 2017-04-15 DIAGNOSIS — M25552 Pain in left hip: Secondary | ICD-10-CM | POA: Insufficient documentation

## 2017-04-15 NOTE — PT/OT Therapy Note (Signed)
Name: Neil Frederick Age: 66 y.o.   Referring Physician: Abe People, MD   Date of Injury: 12/05/2016  Date Care Plan Established/Reviewed: 04/04/2017  Date Treatment Started: 04/04/2017  Visit Count: 4   Diagnosis:   1. Left hip pain        Subjective     Pain   Current pain rating: 4    Social Support/Occupation  Lives in: multiple level home  Lives with: alone  Occupation: accounting     Pt reports slight improvement overall in symptoms, and less soreness/pain since last session especially noted when sitting for a while and then standing up afterwards.                Treatment     Therapeutic Exercises   Justification: To improve hip and low back mobility, flexibility, and strength to reduce L hip pain  Upright bike with cueing for core engagement for warm up with subjective history gained    therex per flow sheet, reviewed stretches in standing, reviewed HEP.  ROM assessed for appropriate progression in today's session.    Manual Therapy   Justification: sidelying on R, L sid eup to reduce myofascial restrictions and reduce L hip pain.   DTM L glute med, pt sidelying on R          Initial Evaluation Reference and/orCurrent Measurements (ROM, Strength, Girth, Outcomes, etc.):    OBJECTIVE:      Range of Motion      04/04/17  04/15/17  Left AROM      Left PROM    Lumbar/Hip 04/04/17  04/15/17  Right AROM      Right PROM   113  145     Hip Flexion 119  145       0  -3     Hip Extension 6  -5       32  23     Hip Abduction 32  40       28  32     Hip IR 25  30       35  55     Hip ER 45  62       (blank fields were intentionally left blank)           Strength      04/04/17    Left Strength  Hip  MMT 04/04/17    Right   5   Hip Flexion 5     4   Hip Extension 4     4   Hip Abduction 4         Hip Adduction       5   Hip IR 5     5   Hip ER 5     5   Quadriceps 5     5   Hamstrings 5     (blank fields were intentionally left blank)     04/04/17    Left Strength  Ankle/Foot  MMT 04/04/17    Right   5   Ankle Dorsiflexion 5          Ankle Plantarflexion           Ankle Inversion           Ankle Eversion       (blank fields were intentionally left blank)       ---      ---   Total Time   Timed Minutes  44 minutes   Total Time  44 minutes        Assessment   Pt with significant restriction in hip rotation esp IR motion. Pt likely with hypertonicity in outer glute musculature secondary to movement restrictions. Significant muscle tightness persistent along the gluts and TFL region and pt provided review of HEP and alternative forms of ITB stretch. Pt also provided education re. Need to take breaks in-between sets instead of completing entire number; pt stated that he normally just treats exercises like his tennis activities and completes the entire number. Pt will benefit from skilled physical therapeutic intervention to improve hip mobility and self-management of symptoms and decrease hypertonicity and tendinosis at upper glute musculature.   Plan   Continue with POC.    Reassess FOTO at next session. Reassess MMT and update progress toward goals.      Goals    Goal 1:  Patient will demonstrate independence in prescribed HEP with proper form, sets and reps for safe discharge to an independent program.   Sessions:  12   Progression:  new      Goal 2:  Increase hip abduction and extensor strength 5/5 to allow for ambulation > 15 minutes after sitting for greater .    Sessions:  12   Progression:  new       Goal 3:  Increase hip ext to 5 degrees to improve gait and reduce stress along the posterior hip during gait    04/15/17: -3 on L, -5 on R klw   Sessions:  12   Progression:  new                                   Henrietta Hoover, PT, DPT     Precautions:  na, HTN, DM        Date of Surgery:        na        MD Follow-up: na                                                        Exercise Flow Sheet     Exercise Specifics  7/31 04/11/17 04/15/17       bike    6'  60RPM  MSP  DL Upright bike 6' iso [id number] klw    Calf stretch       30" x  3  MSP    ITB  30" x 3  MSP 3x30sec  DL      [id number]  DL On step 0X32" klw    HS stretch on step 3x30" klw       Review HEP   MSP  -- Reviewed ITB release techniques and ITB roll on floor 12' klw       bridge   10 x 3  MSP  3x10  DL time       clams    RTB  15 x 2  MSP GTB 3x10   DL 3F57 GTB klw       Hip ext    10 x 3  MSP  #2 3x10 ea  DL time       Hip abd    ----  -  piriformis  30" x 3  MSP  3x30sec  DL time            prone quad  30" x 3  MSP -- Prone IR/ER 1x10 ea B klw              Hip flexor stretch 3x30" klw                  Home Exercise Program        Updated  DL Reviewed klw    (Initials = supervised exercise by clinician)    Access Code: [facility]   URL: [email or web address]   Date: 04/11/2017   Prepared by: [name]     Exercises   Supine ITB Stretch with Strap - 3 reps - 1 sets - 30sec hold - 2x daily - 7x weekly   Prone Quadriceps Stretch with Strap - 3 reps - 1 sets - 30sec hold - 2x daily - 7x weekly   Supine Piriformis Stretch Pulling Heel to Hip - 3 reps - 1 sets - 30sec hold - 2x daily - 7x weekly   Seated Piriformis Stretch - 3 reps - 1 sets - 30sec hold - 2x daily - 7x weekly   Seated Piriformis Stretch - 3 reps - 1 sets - 30sec hold - 2x daily - 7x weekly   ITB Stretch at Wall - 3 reps - 1 sets - 30sec hold - 2x daily - 7x weekly   Quadricep Stretch with Chair and Counter Support - 3 reps - 1 sets - 30sec hold - 2x daily - 7x weekly

## 2017-04-16 ENCOUNTER — Inpatient Hospital Stay: Payer: Commercial Managed Care - HMO | Admitting: Rehabilitative and Restorative Service Providers"

## 2017-04-18 ENCOUNTER — Inpatient Hospital Stay: Payer: Commercial Managed Care - HMO | Attending: Orthopaedic Surgery

## 2017-04-18 DIAGNOSIS — M25552 Pain in left hip: Secondary | ICD-10-CM | POA: Insufficient documentation

## 2017-04-18 NOTE — PT/OT Therapy Note (Signed)
Name: Neil Frederick Age: 66 y.o.   Referring Physician: Abe People, MD   Date of Injury: 12/05/2016  Date Care Plan Established/Reviewed: 04/04/2017  Date Treatment Started: 04/04/2017  Visit Count: 5   Diagnosis:   1. Left hip pain        Subjective     Pain   Current pain rating: 4    Social Support/Occupation  Lives in: multiple level home  Lives with: alone  Occupation: accounting     Pt states that he has been doing the HEP recommended religiously 2x/day. Has since been having soreness in the low back, hip, and below the knee. Is not sure he is doing the exercises correctly.                         Treatment     Therapeutic Exercises   Justification: To improve hip and low back mobility, flexibility, and strength to reduce L hip pain  Upright bike with cueing for core engagement for warm up with subjective history gained    therex per flow sheet,   reviewed HEP to ensure proper form     Strength measurements taken today     Manual Therapy   Justification: sidelying on R, L sid eup to reduce myofascial restrictions and reduce L hip pain.   DTM L glute med, pt sidelying on R with pillow between knees           Initial Evaluation Reference and/orCurrent Measurements (ROM, Strength, Girth, Outcomes, etc.):    OBJECTIVE:      Range of Motion      04/04/17  04/15/17  Left AROM      Left PROM    Lumbar/Hip 04/04/17  04/15/17  Right AROM      Right PROM   113  145     Hip Flexion 119  145       0  -3     Hip Extension 6  -5       32  23     Hip Abduction 32  40       28  32     Hip IR 25  30       35  55     Hip ER 45  62       (blank fields were intentionally left blank)           Strength      04/04/17    Left  04/18/17 Strength  Hip  MMT 04/04/17    Right  04/18/17   5  5  Hip Flexion 5  5   4   4+ Hip Extension 4  4+   4  4+ Hip Abduction 4  4+       Hip Adduction       5  5 Hip IR 5  5   5  5  Hip ER 5  5   5  5  Quadriceps 5  5   5  5  Hamstrings 5  5   (blank fields were intentionally left blank)     04/04/17    Left  Strength  Ankle/Foot  MMT 04/04/17    Right   5   Ankle Dorsiflexion 5         Ankle Plantarflexion           Ankle Inversion           Ankle Eversion       (  blank fields were intentionally left blank)       ---      ---   Total Time   Timed Minutes  45 minutes   Total Time  45 minutes        Assessment   Strength measurements taken today show improvement since IE. Tightness in glute med decreased with manual therapy in s/l. Reviewed ITBand and piriforimis stretches with patient per request to ensure proper form- corrected ITBand stretch to prevent l/s rotation, and piriformis stretch to correct line of pull of the knee. Able to complete all TE without increase in pain or symptoms and denied pain or fatigue at end of session. FOTO improved to 98 today  Plan   Continue with POC. Progress glute strengthening and stabilization to pt tolerance.       Goals    Goal 1:  Patient will demonstrate independence in prescribed HEP with proper form, sets and reps for safe discharge to an independent program.   Sessions:  12   Progression:  new      Goal 2:  Increase hip abduction and extensor strength 5/5 to allow for ambulation > 15 minutes after sitting for greater .     04/18/2017 AT: hip abd 4+ B, hip ext 4+ B    Sessions:  12   Progression:  new       Goal 3:  Increase hip ext to 5 degrees to improve gait and reduce stress along the posterior hip during gait    04/15/17: -3 on L, -5 on R klw   Sessions:  12   Progression:  new                                   [employer]on [employer], PTA    Precautions:  na, HTN, DM        Date of Surgery:        na        MD Follow-up: na                                                        Exercise Flow Sheet     Exercise Specifics  7/31 04/11/17 04/15/17 04/18/17      bike    6'  60RPM  MSP  DL Upright bike 6' iso [id number] klw Upright bike x5' AT    Calf stretch       30" x 3  MSP    ITB  30" x 3  MSP 3x30sec  DL      [id number]  DL On step 5A21" klw    HS stretch on step 3x30" klw Calf stretch on  step 3x30" B AT      Review HEP   MSP  -- Reviewed ITB release techniques and ITB roll on floor 12' klw --      bridge   10 x 3  MSP  3x10  DL time Bridge on pball H08 AT       clams    RTB  15 x 2  MSP GTB 3x10   DL 6V78 GTB klw GTB I69 AT       Hip ext    10 x 3  MSP  #2 3x10  ea  DL time Hip ext 3# N82 B AT      Hip abd    ----  -           piriformis  30" x 3  MSP  3x30sec  DL time HEP           prone quad  30" x 3  MSP -- Prone IR/ER 1x10 ea B klw Prone IR/ER x15 ea B AT             Hip flexor stretch 3x30" klw                  Home Exercise Program        Updated  DL Reviewed klw    (Initials = supervised exercise by clinician)    Access Code: [facility]   URL: [email or web address]   Date: 04/11/2017   Prepared by: [name]     Exercises   Supine ITB Stretch with Strap - 3 reps - 1 sets - 30sec hold - 2x daily - 7x weekly   Prone Quadriceps Stretch with Strap - 3 reps - 1 sets - 30sec hold - 2x daily - 7x weekly   Supine Piriformis Stretch Pulling Heel to Hip - 3 reps - 1 sets - 30sec hold - 2x daily - 7x weekly   Seated Piriformis Stretch - 3 reps - 1 sets - 30sec hold - 2x daily - 7x weekly   Seated Piriformis Stretch - 3 reps - 1 sets - 30sec hold - 2x daily - 7x weekly   ITB Stretch at Wall - 3 reps - 1 sets - 30sec hold - 2x daily - 7x weekly   Quadricep Stretch with Chair and Counter Support - 3 reps - 1 sets - 30sec hold - 2x daily - 7x weekly

## 2017-04-23 ENCOUNTER — Inpatient Hospital Stay: Payer: Commercial Managed Care - HMO | Attending: Orthopaedic Surgery

## 2017-04-23 DIAGNOSIS — M25552 Pain in left hip: Secondary | ICD-10-CM | POA: Insufficient documentation

## 2017-04-23 NOTE — PT/OT Therapy Note (Signed)
Name: Neil Frederick Age: 66 y.o.   Referring Physician: Abe People, MD   Date of Injury: 12/05/2016  Date Care Plan Established/Reviewed: 04/04/2017  Date Treatment Started: 04/04/2017  Visit Count: 6   Diagnosis:   1. Left hip pain        Subjective     Pain   Current pain rating: 4    Social Support/Occupation  Lives in: multiple level home  Lives with: alone  Occupation: accounting     Pt reports that he has been doing well and feels as though the pain has greatly improved. Neil Frederick is mostly in his mid thigh (point through ITB) after playing tennis.                   Treatment     Therapeutic Exercises   Justification: To improve hip and low back mobility, flexibility, and strength to reduce L hip pain  Upright bike with cueing for core engagement for warm up with subjective history gained    therex per flow sheet,   Updated HEP    Progression of clamshellls to BlTB    Neuromuscular Re-Education   Justification: To facilitate improved lower kinetic chain proprioception and mobility   Tactile cueing with PPT during side step to facilitate improved core control    Tactile cueing with SL bridges to faciliate improve glute activation for hip extension and prevent umbar extension over compensations     Manual Therapy   Justification: sidelying on R, L sid eup to reduce myofascial restrictions and reduce L hip pain.   DTM L glute med, pt sidelying on R with pillow between knees           Initial Evaluation Reference and/orCurrent Measurements (ROM, Strength, Girth, Outcomes, etc.):    OBJECTIVE:      Range of Motion      04/04/17  04/15/17  Left AROM    Lumbar/Hip 04/04/17  04/15/17  Right AROM   113  145 Hip Flexion 119  145   0  -3 Hip Extension 6  -5   32  23 Hip Abduction 32  40   28  32 Hip IR 25  30   35  55 Hip ER 45  62   (blank fields were intentionally left blank)           Strength      04/04/17    Left  04/18/17 Strength  Hip  MMT 04/04/17    Right  04/18/17   5  5  Hip Flexion 5  5   4   4+ Hip Extension 4  4+   4  4+ Hip  Abduction 4  4+       Hip Adduction       5  5 Hip IR 5  5   5  5  Hip ER 5  5   5  5  Quadriceps 5  5   5  5  Hamstrings 5  5   (blank fields were intentionally left blank)     04/04/17    Left Strength  Ankle/Foot  MMT 04/04/17    Right   5   Ankle Dorsiflexion 5         Ankle Plantarflexion           Ankle Inversion           Ankle Eversion       (blank fields were intentionally left blank)       ---      ---  Total Time   Timed Minutes  47 minutes   Total Time  47 minutes        Assessment   Pt has been able to demonstrate significant improvement in objective measurements and has improved FOTO score to 98%. Pt is only experiencing mild pain after playing tennis that is controllable with stretches. Pt is becoming (I) in his HEp and requires min VCing for set up. Pt should be prepared to Neil Frederick to HEP in 3 more sessions. Strengthening program updated with HEP with instructions for performance 2-3 times a week for strength training gains.   Plan   Follow up on HEP      Goals    Goal 1:  Patient will demonstrate independence in prescribed HEP with proper form, sets and reps for safe discharge to an independent program.   Sessions:  12   Progression:  new      Goal 2:  Increase hip abduction and extensor strength 5/5 to allow for ambulation > 15 minutes after sitting for greater .     04/18/2017 AT: hip abd 4+ B, hip ext 4+ B    Sessions:  12   Progression:  new       Goal 3:  Increase hip ext to 5 degrees to improve gait and reduce stress along the posterior hip during gait    04/15/17: -3 on L, -5 on R klw   Sessions:  12   Progression:  new                                   [name], PT    Precautions:  na, HTN, DM        Date of Surgery:        na        MD Follow-up: na                                                        Exercise Flow Sheet     Exercise Specifics  7/31 04/11/17 04/15/17 04/18/17 08/14      bike    6'  60RPM  MSP  DL Upright bike 6' iso 16XWR klw Upright bike x5' AT  5'  MSP   Calf stretch        30" x 3  MSP    ITB  30" x 3  MSP 3x30sec  DL      [id number]  DL On step 8J19" klw    HS stretch on step 3x30" klw Calf stretch on step 3x30" B AT 30" x3  MSP      Review HEP   MSP  -- Reviewed ITB release techniques and ITB roll on floor 12' klw -- Side steps  GTB  3H  MSP      bridge   10 x 3  MSP  3x10  DL time Bridge on pball J47 AT  Sl bridge  10 x 2  MSP      clams    RTB  15 x 2  MSP GTB 3x10   DL 8G95 GTB klw GTB A21 AT  BlTB  30X  MSP      Hip ext    10 x 3  MSP  #  2 3x10 ea  DL time Hip ext 3# Z61 B AT       Hip abd    ----  -  Leg Press  65# SL  10 x 3  MSP          piriformis  30" x 3  MSP  3x30sec  DL time HEP SL calf riases  15 x 2  MSP           prone quad  30" x 3  MSP -- Prone IR/ER 1x10 ea B klw Prone IR/ER x15 ea B AT              Hip flexor stretch 3x30" klw                    Home Exercise Program        Updated  DL Reviewed klw  Updated HEP.    (Initials = supervised exercise by clinician)    Access Code: [facility]   URL: [email or web address]   Date: 04/23/2017   Prepared by: [name]     Exercises   Supine ITB Stretch with Strap - 3 reps - 1 sets - 30sec hold - 2x daily - 7x weekly   Prone Quadriceps Stretch with Strap - 3 reps - 1 sets - 30sec hold - 2x daily - 7x weekly   Supine Piriformis Stretch Pulling Heel to Hip - 3 reps - 1 sets - 30sec hold - 2x daily - 7x weekly   Seated Piriformis Stretch - 3 reps - 1 sets - 30sec hold - 2x daily - 7x weekly   Seated Piriformis Stretch - 3 reps - 1 sets - 30sec hold - 2x daily - 7x weekly   ITB Stretch at Wall - 3 reps - 1 sets - 30sec hold - 2x daily - 7x weekly   Quadricep Stretch with Chair and Counter Support - 3 reps - 1 sets - 30sec hold - 2x daily - 7x weekly   Clamshell with Resistance - 10 reps - 3 sets - 1x daily - 3x weekly   Side Stepping with Resistance at Feet - 10 reps - 3 sets - 1x daily - 3x weekly   Standing Single Leg Heel Raise - 10 reps - 3 sets - 1x daily - 3x weekly   Alternating Single Leg Bridge - 10  reps - 3 sets - 1x daily - 3x weekly   Single Leg Sit to Stand with Arms Extended - 10 reps - 3 sets - 1x daily - 3x weekly

## 2017-04-25 ENCOUNTER — Inpatient Hospital Stay: Payer: Commercial Managed Care - HMO | Attending: Orthopaedic Surgery

## 2017-04-25 DIAGNOSIS — M25552 Pain in left hip: Secondary | ICD-10-CM | POA: Insufficient documentation

## 2017-04-25 NOTE — PT/OT Therapy Note (Signed)
Name: Neil Frederick Age: 66 y.o.   Referring Physician: Abe People, MD   Date of Injury: 12/05/2016  Date Care Plan Established/Reviewed: 04/04/2017  Date Treatment Started: 04/04/2017  Visit Count: 7   Diagnosis:   1. Left hip pain        Subjective     Pain   Current pain rating: 4    Social Support/Occupation  Lives in: multiple level home  Lives with: alone  Occupation: accounting     Pt states that the leg is sore from the massaging last time. Has not played a tennis match yet due to trying to recover from a torn ligament in his ankle.                       Treatment     Therapeutic Exercises   Justification: To improve hip and low back mobility, flexibility, and strength to reduce L hip pain  Upright bike with cueing for core engagement for warm up with subjective history gained    therex per flow sheet,     Verbal cuing to decrease trunk sway with sidestepping and decrease speed with LP    Neuromuscular Re-Education   Justification: To facilitate improved lower kinetic chain proprioception and mobility   Tactile cueing with SL bridges to increase glute activation and decrease l/s lordosis     Manual Therapy   Justification: sidelying on R, L sid eup to reduce myofascial restrictions and reduce L hip pain.   DTM L glute med with pt in R s/l  with pillow between knees           Initial Evaluation Reference and/orCurrent Measurements (ROM, Strength, Girth, Outcomes, etc.):    OBJECTIVE:      Range of Motion      04/04/17  04/15/17  Left AROM    Lumbar/Hip 04/04/17  04/15/17  Right AROM   113  145 Hip Flexion 119  145   0  -3 Hip Extension 6  -5   32  23 Hip Abduction 32  40   28  32 Hip IR 25  30   35  55 Hip ER 45  62   (blank fields were intentionally left blank)           Strength      04/04/17    Left  04/18/17 Strength  Hip  MMT 04/04/17    Right  04/18/17   5  5  Hip Flexion 5  5   4   4+ Hip Extension 4  4+   4  4+ Hip Abduction 4  4+       Hip Adduction       5  5 Hip IR 5  5   5  5  Hip ER 5  5   5  5  Quadriceps 5   5   5  5  Hamstrings 5  5   (blank fields were intentionally left blank)     04/04/17    Left Strength  Ankle/Foot  MMT 04/04/17    Right   5   Ankle Dorsiflexion 5         Ankle Plantarflexion           Ankle Inversion           Ankle Eversion       (blank fields were intentionally left blank)       ---      ---   Total Time  Timed Minutes  43 minutes   Total Time  43 minutes        Assessment   Tightness in piriformis responded well to manual therapy today. Able to complete all exercises without ill effect despite soreness reported at start of session. Increased weight on LP however pt still reports ease with exercise. Required cuing to prevent vaulting with SL heel raise and did better after training. Discussed d/c at end of scheduled appointments and pt in agreement.   Plan   Follow up on HEP. D/c at end of scheduled sessions.       Goals    Goal 1:  Patient will demonstrate independence in prescribed HEP with proper form, sets and reps for safe discharge to an independent program.   Sessions:  12   Progression:  new      Goal 2:  Increase hip abduction and extensor strength 5/5 to allow for ambulation > 15 minutes after sitting for greater .     04/18/2017 AT: hip abd 4+ B, hip ext 4+ B    Sessions:  12   Progression:  new       Goal 3:  Increase hip ext to 5 degrees to improve gait and reduce stress along the posterior hip during gait    04/15/17: -3 on L, -5 on R klw   Sessions:  12   Progression:  new                                   Ashayla Subia, PTA    Precautions:  na, HTN, DM        Date of Surgery:        na        MD Follow-up: na                                                        Exercise Flow Sheet     Exercise Specifics  7/31 04/11/17 04/15/17 04/18/17 08/14 04/25/17      bike    6'  60RPM  MSP  DL Upright bike 6' iso 16XWR klw Upright bike x5' AT  5'  MSP 6' recumbent AT    Calf stretch       30" x 3  MSP    ITB  30" x 3  MSP 3x30sec  DL      [id number]  DL On step 8J19" klw    HS stretch on step  3x30" klw Calf stretch on step 3x30" B AT 30" x3  MSP 30" x3 AT      Review HEP   MSP  -- Reviewed ITB release techniques and ITB roll on floor 12' klw -- Side steps  GTB  3H  MSP Side steps  GTB 10 steps per hall x4 halls AT       bridge   10 x 3  MSP  3x10  DL time Bridge on pball J47 AT  Sl bridge  10 x 2  MSP Sl bridge  10 x 2 AT      clams    RTB  15 x 2  MSP GTB 3x10   DL 8G95 GTB klw GTB A21 AT  BlTB  30X  MSP BlTB  30X AT      Hip ext    10 x 3  MSP  #2 3x10 ea  DL time Hip ext 3# U98 B AT        Hip abd    ----  -  Leg Press  65# SL  10 x 3  MSP Leg Press  75# SL  10 x 2 AT          piriformis  30" x 3  MSP  3x30sec  DL time HEP SL calf riases  15 x 2  MSP SL calf riases  15 x 2 AT           prone quad  30" x 3  MSP -- Prone IR/ER 1x10 ea B klw Prone IR/ER x15 ea B AT               Hip flexor stretch 3x30" klw                      Home Exercise Program        Updated  DL Reviewed klw  Updated HEP.     (Initials = supervised exercise by clinician)    Access Code: [facility]   URL: [email or web address]   Date: 04/23/2017   Prepared by: [name]     Exercises   Supine ITB Stretch with Strap - 3 reps - 1 sets - 30sec hold - 2x daily - 7x weekly   Prone Quadriceps Stretch with Strap - 3 reps - 1 sets - 30sec hold - 2x daily - 7x weekly   Supine Piriformis Stretch Pulling Heel to Hip - 3 reps - 1 sets - 30sec hold - 2x daily - 7x weekly   Seated Piriformis Stretch - 3 reps - 1 sets - 30sec hold - 2x daily - 7x weekly   Seated Piriformis Stretch - 3 reps - 1 sets - 30sec hold - 2x daily - 7x weekly   ITB Stretch at Wall - 3 reps - 1 sets - 30sec hold - 2x daily - 7x weekly   Quadricep Stretch with Chair and Counter Support - 3 reps - 1 sets - 30sec hold - 2x daily - 7x weekly   Clamshell with Resistance - 10 reps - 3 sets - 1x daily - 3x weekly   Side Stepping with Resistance at Feet - 10 reps - 3 sets - 1x daily - 3x weekly   Standing Single Leg Heel Raise - 10 reps - 3 sets - 1x daily - 3x  weekly   Alternating Single Leg Bridge - 10 reps - 3 sets - 1x daily - 3x weekly   Single Leg Sit to Stand with Arms Extended - 10 reps - 3 sets - 1x daily - 3x weekly

## 2017-04-30 ENCOUNTER — Inpatient Hospital Stay: Payer: Commercial Managed Care - HMO | Attending: Orthopaedic Surgery

## 2017-04-30 DIAGNOSIS — M25552 Pain in left hip: Secondary | ICD-10-CM | POA: Insufficient documentation

## 2017-04-30 NOTE — PT/OT Therapy Note (Addendum)
Name: Neil Frederick Age: 66 y.o.   Referring Physician: Abe People, MD   Date of Injury: 12/05/2016  Date Care Plan Established/Reviewed: 04/04/2017  Date Treatment Started: 04/04/2017  Visit Count: 8   Diagnosis:   1. Left hip pain        Subjective     Pain   Current pain rating: 4    Social Support/Occupation  Lives in: multiple level home  Lives with: alone  Occupation: accounting     Pt states that his leg feels sore on the lateral side of his L thigh that has been there for two weeks. Pt states "it just feels like a soreness".                 Treatment     Therapeutic Exercises   Justification: To improve hip and low back mobility, flexibility, and strength to reduce L hip pain  Upright bike with cueing for core engagement for warm up with subjective history gained    therex per flow sheet    Progression of side steps TB to BlTB    MMT assessment       Neuromuscular Re-Education   Justification: To facilitate improved lower kinetic chain proprioception and mobility   Tactile cueing with SL bridges to increase glute activation and decrease l/s lordosis     Heavy cueing with leg press to place weigh ti hells for improve muscle activation    Manual Therapy   Justification: sidelying on R, L sid eup to reduce myofascial restrictions and reduce L hip pain.   DTM L ITB and piriformis.   G6 streaking through ITB          Initial Evaluation Reference and/orCurrent Measurements (ROM, Strength, Girth, Outcomes, etc.):    OBJECTIVE:      Range of Motion      04/04/17  04/15/17  Left AROM    Lumbar/Hip 04/04/17  04/15/17  Right AROM   113  145 Hip Flexion 119  145   0  -3 Hip Extension 6  -5   32  23 Hip Abduction 32  40   28  32 Hip IR 25  30   35  55 Hip ER 45  62   (blank fields were intentionally left blank)           Strength      04/04/17    Left  04/18/17 L  04/30/17 Strength  Hip  MMT 04/04/17    Right  04/18/17 R  04/30/17   5  5 5  Hip Flexion 5  5 5   4   4+ 5 Hip Extension 4  4+ 5   4  4+ 5 Hip Abduction 4  4+ 5       5 Hip  Adduction     5   5  5 5  Hip IR 5  5 5   5  5 5  Hip ER 5  5 5   5  5 5  Quadriceps 5  5 5   5  5 5  Hamstrings 5  5 5    (blank fields were intentionally left blank)     04/04/17    Left Strength  Ankle/Foot  MMT 04/04/17    Right   5   Ankle Dorsiflexion 5         Ankle Plantarflexion           Ankle Inversion           Ankle Eversion       (  blank fields were intentionally left blank)       ---      ---   Total Time   Timed Minutes  45 minutes   Total Time  45 minutes        Assessment   Continued education with patient over importance of maintence of continued compliance with HEP after PT graduation in order to maintain well balance strength through lumbo pelvic and lower kinetics chain to continue participation in sports with no pain. Pt voiced understanding. Pt soreness on lateral aspect of mid thigh attributed to ITB dysfunction. Advised pt to complete foam rolling or ITB stretching s/p tennis play to reduce. Pt has met all goals and will be ready to [place] at next session.   Plan   [place] next session. Pt aware      Goals    Goal 1:  Patient will demonstrate independence in prescribed HEP with proper form, sets and reps for safe discharge to an independent program.   Sessions:  12   Progression:  met      Goal 2:  Increase hip abduction and extensor strength 5/5 to allow for ambulation > 15 minutes after sitting for greater .        Sessions:  12   Progression:  met       Goal 3:  Increase hip ext to 5 degrees to improve gait and reduce stress along the posterior hip during gait    04/15/17: -3 on L, -5 on R klw   Sessions:  12   Progression:  new                                   [name], PT    Precautions:  na, HTN, DM        Date of Surgery:        na        MD Follow-up: na                                                        Exercise Flow Sheet     Exercise Specifics  7/31 04/11/17 04/15/17 04/18/17 08/14 04/25/17 8/21      bike    6'  60RPM  MSP  DL Upright bike 6' iso 16XWR klw Upright bike x5' AT   5'  MSP 6' recumbent AT  6'  R. bike  MSP   Calf stretch       30" x 3  MSP    ITB  30" x 3  MSP 3x30sec  DL      [id number]  DL On step 8J19" klw    HS stretch on step 3x30" klw Calf stretch on step 3x30" B AT 30" x3  MSP 30" x3 AT 30" x 3  MSP      Review HEP   MSP  -- Reviewed ITB release techniques and ITB roll on floor 12' klw -- Side steps  GTB  3H  MSP Side steps  GTB 10 steps per hall x4 halls AT  Side steps  BlTB  MSP      bridge   10 x 3  MSP  3x10  DL time Bridge on pball J47 AT  Sl bridge  10 x 2  MSP Sl bridge  10 x 2 AT 10 x 2  MSP      clams    RTB  15 x 2  MSP GTB 3x10   DL 1O10 GTB klw GTB R60 AT  BlTB  30X  MSP BlTB  30X AT ---      Hip ext    10 x 3  MSP  #2 3x10 ea  DL time Hip ext 3# A54 B AT   Standing hip extension  RTB  10 x 3  MSP      Hip abd    ----  -  Leg Press  65# SL  10 x 3  MSP Leg Press  75# SL  10 x 2 AT Leg press  85#  10 x 3  MSP          piriformis  30" x 3  MSP  3x30sec  DL time HEP SL calf riases  15 x 2  MSP SL calf riases  15 x 2 AT 15 x 3  MSP           prone quad  30" x 3  MSP -- Prone IR/ER 1x10 ea B klw Prone IR/ER x15 ea B AT                Hip flexor stretch 3x30" klw    Standing hip flexor stretch  30" x 3  MSP                    Home Exercise Program        Updated  DL Reviewed klw  Updated HEP.   Reviewed MSP   (Initials = supervised exercise by clinician)    Access Code: [facility]   URL: [email or web address]   Date: 04/23/2017   Prepared by: [name]     Exercises   Supine ITB Stretch with Strap - 3 reps - 1 sets - 30sec hold - 2x daily - 7x weekly   Prone Quadriceps Stretch with Strap - 3 reps - 1 sets - 30sec hold - 2x daily - 7x weekly   Supine Piriformis Stretch Pulling Heel to Hip - 3 reps - 1 sets - 30sec hold - 2x daily - 7x weekly   Seated Piriformis Stretch - 3 reps - 1 sets - 30sec hold - 2x daily - 7x weekly   Seated Piriformis Stretch - 3 reps - 1 sets - 30sec hold - 2x daily - 7x weekly   ITB Stretch at Wall - 3 reps - 1 sets - 30sec  hold - 2x daily - 7x weekly   Quadricep Stretch with Chair and Counter Support - 3 reps - 1 sets - 30sec hold - 2x daily - 7x weekly   Clamshell with Resistance - 10 reps - 3 sets - 1x daily - 3x weekly   Side Stepping with Resistance at Feet - 10 reps - 3 sets - 1x daily - 3x weekly   Standing Single Leg Heel Raise - 10 reps - 3 sets - 1x daily - 3x weekly   Alternating Single Leg Bridge - 10 reps - 3 sets - 1x daily - 3x weekly   Single Leg Sit to Stand with Arms Extended - 10 reps - 3 sets - 1x daily - 3x weekly

## 2017-05-02 ENCOUNTER — Inpatient Hospital Stay: Payer: Commercial Managed Care - HMO

## 2017-05-07 NOTE — Progress Notes (Signed)
Name: Neil Frederick Age: 66 y.o.   Referring Physician: Abe People, MD   Date of Injury: 12/05/2016  Date Care Plan Established/Reviewed: 04/04/2017  Date Treatment Started: 04/04/2017  Visit Count: 8   Diagnosis:   1. Left hip pain        Subjective     Pain   Current pain rating: 4    Social Support/Occupation  Lives in: multiple level home  Lives with: alone  Occupation: accounting     Pt states that his leg feels sore on the lateral side of his L thigh that has been there for two weeks. Pt states "it just feels like a soreness".     OBJECTIVE:      Range of Motion      04/04/17  04/15/17  Left AROM    Lumbar/Hip 04/04/17  04/15/17  Right AROM   113  145 Hip Flexion 119  145   0  -3 Hip Extension 6  -5   32  23 Hip Abduction 32  40   28  32 Hip IR 25  30   35  55 Hip ER 45  62   (blank fields were intentionally left blank)           Strength      04/04/17    Left  04/18/17 L  04/30/17 Strength  Hip  MMT 04/04/17    Right  04/18/17 R  04/30/17   5  5 5  Hip Flexion 5  5 5   4   4+ 5 Hip Extension 4  4+ 5   4  4+ 5 Hip Abduction 4  4+ 5       5 Hip Adduction     5   5  5 5  Hip IR 5  5 5   5  5 5  Hip ER 5  5 5   5  5 5  Quadriceps 5  5 5   5  5 5  Hamstrings 5  5 5    (blank fields were intentionally left blank)     04/04/17    Left Strength  Ankle/Foot  MMT 04/04/17    Right   5   Ankle Dorsiflexion 5         Ankle Plantarflexion           Ankle Inversion           Ankle Eversion       (blank fields were intentionally left blank)         Assessment   Continued education with patient over importance of maintence of continued compliance with HEP after PT graduation in order to maintain well balance strength through lumbo pelvic and lower kinetics chain to continue participation in sports with no pain. Pt voiced understanding. Pt soreness on lateral aspect of mid thigh attributed to ITB dysfunction. Advised pt to complete foam rolling or ITB stretching s/p tennis play to reduce. Pt has met all goals and will be ready to [place] at next session.      Addendum as of 05/07/2017: Patient has canceled last physical therapy appointment through central scheduling without asking to discuss with therapist secondary to reports of pain. Unknown to therapist of pain origin or location. Patient is following up with MD on 05/09/2017.     Plan   [place] next session. Pt aware                           ---      ---  Total Time   Timed Minutes  45 minutes   Total Time  45 minutes           Goals    Goal 1:  Patient will demonstrate independence in prescribed HEP with proper form, sets and reps for safe discharge to an independent program.   Sessions:  12   Progression:  met      Goal 2:  Increase hip abduction and extensor strength 5/5 to allow for ambulation > 15 minutes after sitting for greater .        Sessions:  12   Progression:  met       Goal 3:  Increase hip ext to 5 degrees to improve gait and reduce stress along the posterior hip during gait       Sessions:  12   Progression:  met                                   [name], PT

## 2017-05-09 ENCOUNTER — Inpatient Hospital Stay: Payer: Commercial Managed Care - HMO

## 2017-07-11 HISTORY — PX: EXPLORATORY LAPAROTOMY: SUR591

## 2017-07-13 ENCOUNTER — Encounter
Admission: EM | Disposition: A | Discharge status: Home / Self Care | Payer: Self-pay | Source: Home / Self Care | Attending: Internal Medicine

## 2017-07-13 ENCOUNTER — Inpatient Hospital Stay: Payer: Medicare Other | Admitting: Anesthesiology

## 2017-07-13 ENCOUNTER — Inpatient Hospital Stay: Payer: Medicare Other

## 2017-07-13 ENCOUNTER — Emergency Department: Payer: Medicare Other

## 2017-07-13 ENCOUNTER — Inpatient Hospital Stay
Admission: EM | Admit: 2017-07-13 | Discharge: 2017-07-22 | DRG: 336 | Disposition: A | Discharge status: Home / Self Care | Payer: Medicare Other | Attending: Internal Medicine | Admitting: Internal Medicine

## 2017-07-13 DIAGNOSIS — Z7984 Long term (current) use of oral hypoglycemic drugs: Secondary | ICD-10-CM

## 2017-07-13 DIAGNOSIS — K565 Intestinal adhesions [bands], unspecified as to partial versus complete obstruction: Principal | ICD-10-CM | POA: Diagnosis present

## 2017-07-13 DIAGNOSIS — N400 Enlarged prostate without lower urinary tract symptoms: Secondary | ICD-10-CM | POA: Diagnosis present

## 2017-07-13 DIAGNOSIS — E119 Type 2 diabetes mellitus without complications: Secondary | ICD-10-CM | POA: Diagnosis present

## 2017-07-13 DIAGNOSIS — K562 Volvulus: Secondary | ICD-10-CM | POA: Diagnosis present

## 2017-07-13 DIAGNOSIS — D72829 Elevated white blood cell count, unspecified: Secondary | ICD-10-CM | POA: Diagnosis present

## 2017-07-13 DIAGNOSIS — E7800 Pure hypercholesterolemia, unspecified: Secondary | ICD-10-CM | POA: Diagnosis present

## 2017-07-13 DIAGNOSIS — M1990 Unspecified osteoarthritis, unspecified site: Secondary | ICD-10-CM | POA: Diagnosis present

## 2017-07-13 DIAGNOSIS — K9189 Other postprocedural complications and disorders of digestive system: Secondary | ICD-10-CM | POA: Diagnosis not present

## 2017-07-13 DIAGNOSIS — Z87891 Personal history of nicotine dependence: Secondary | ICD-10-CM

## 2017-07-13 DIAGNOSIS — I10 Essential (primary) hypertension: Secondary | ICD-10-CM | POA: Diagnosis present

## 2017-07-13 DIAGNOSIS — K567 Ileus, unspecified: Secondary | ICD-10-CM | POA: Diagnosis not present

## 2017-07-13 DIAGNOSIS — K559 Vascular disorder of intestine, unspecified: Secondary | ICD-10-CM | POA: Diagnosis present

## 2017-07-13 DIAGNOSIS — Z7982 Long term (current) use of aspirin: Secondary | ICD-10-CM

## 2017-07-13 DIAGNOSIS — K56609 Unspecified intestinal obstruction, unspecified as to partial versus complete obstruction: Secondary | ICD-10-CM | POA: Diagnosis present

## 2017-07-13 DIAGNOSIS — L409 Psoriasis, unspecified: Secondary | ICD-10-CM | POA: Diagnosis present

## 2017-07-13 HISTORY — DX: Psoriasis, unspecified: L40.9

## 2017-07-13 HISTORY — PX: EXPLORATORY LAPAROTOMY, RESECTION SMALL BOWEL: SHX4081

## 2017-07-13 LAB — CBC AND DIFFERENTIAL
Absolute NRBC: 0 10*3/uL
Basophils Absolute Automated: 0.02 10*3/uL (ref 0.00–0.20)
Basophils Automated: 0.2 %
Eosinophils Absolute Automated: 0.01 10*3/uL (ref 0.00–0.70)
Eosinophils Automated: 0.1 %
Hematocrit: 43.8 % (ref 42.0–52.0)
Hgb: 15 g/dL (ref 13.0–17.0)
Immature Granulocytes Absolute: 0.03 10*3/uL
Immature Granulocytes: 0.2 %
Lymphocytes Absolute Automated: 0.58 10*3/uL (ref 0.50–4.40)
Lymphocytes Automated: 4.5 %
MCH: 31.9 pg (ref 28.0–32.0)
MCHC: 34.2 g/dL (ref 32.0–36.0)
MCV: 93.2 fL (ref 80.0–100.0)
MPV: 10 fL (ref 9.4–12.3)
Monocytes Absolute Automated: 0.47 10*3/uL (ref 0.00–1.20)
Monocytes: 3.7 %
Neutrophils Absolute: 11.74 10*3/uL — ABNORMAL HIGH (ref 1.80–8.10)
Neutrophils: 91.3 %
Nucleated RBC: 0 /100 WBC (ref 0.0–1.0)
Platelets: 279 10*3/uL (ref 140–400)
RBC: 4.7 10*6/uL (ref 4.70–6.00)
RDW: 15 % (ref 12–15)
WBC: 12.85 10*3/uL — ABNORMAL HIGH (ref 3.50–10.80)

## 2017-07-13 LAB — GLUCOSE WHOLE BLOOD - POCT
Whole Blood Glucose POCT: 148 mg/dL — ABNORMAL HIGH (ref 70–100)
Whole Blood Glucose POCT: 125 mg/dL — ABNORMAL HIGH (ref 70–100)
Whole Blood Glucose POCT: 143 mg/dL — ABNORMAL HIGH (ref 70–100)
Whole Blood Glucose POCT: 103 mg/dL — ABNORMAL HIGH (ref 70–100)

## 2017-07-13 LAB — COMPREHENSIVE METABOLIC PANEL
ALT: 29 U/L (ref 0–55)
AST (SGOT): 39 U/L — ABNORMAL HIGH (ref 5–34)
Albumin/Globulin Ratio: 1.3 (ref 0.9–2.2)
Albumin: 4.9 g/dL (ref 3.5–5.0)
Alkaline Phosphatase: 84 U/L (ref 38–106)
Anion Gap: 13 (ref 5.0–15.0)
BUN: 13 mg/dL (ref 9.0–28.0)
Bilirubin, Total: 0.5 mg/dL (ref 0.2–1.2)
CO2: 24 mEq/L (ref 22–29)
Calcium: 11 mg/dL — ABNORMAL HIGH (ref 8.5–10.5)
Chloride: 105 mEq/L (ref 100–111)
Creatinine: 1.2 mg/dL (ref 0.7–1.3)
Globulin: 3.9 g/dL — ABNORMAL HIGH (ref 2.0–3.6)
Glucose: 171 mg/dL — ABNORMAL HIGH (ref 70–100)
Potassium: 4 mEq/L (ref 3.5–5.1)
Protein, Total: 8.8 g/dL — ABNORMAL HIGH (ref 6.0–8.3)
Sodium: 142 mEq/L (ref 136–145)

## 2017-07-13 LAB — URINALYSIS, REFLEX TO MICROSCOPIC EXAM IF INDICATED
Bilirubin, UA: NEGATIVE
Blood, UA: NEGATIVE
Glucose, UA: NEGATIVE
Ketones UA: NEGATIVE
Leukocyte Esterase, UA: NEGATIVE
Nitrite, UA: NEGATIVE
Protein, UR: 30 — AB
Specific Gravity UA: >1.03 (ref 1.001–1.035)
Urine pH: 5.5 (ref 5.0–8.0)
Urobilinogen, UA: 0.2 mg/dL

## 2017-07-13 LAB — LIPASE: Lipase: 34 U/L (ref 8–78)

## 2017-07-13 LAB — GFR: EGFR: >60

## 2017-07-13 SURGERY — EXPLORATORY LAPAROTOMY, RESECTION SMALL BOWEL
Anesthesia: Anesthesia General | Wound class: Clean

## 2017-07-13 MED ORDER — GLYCOPYRROLATE 0.2 MG/ML IJ SOLN
INTRAMUSCULAR | Status: AC
Start: 2017-07-13 — End: ?
  Filled 2017-07-13: qty 8

## 2017-07-13 MED ORDER — SODIUM CHLORIDE 0.9 % IV BOLUS
1000.0000 mL | Freq: Once | INTRAVENOUS | Status: AC
Start: 2017-07-13 — End: 2017-07-13
  Administered 2017-07-13: 03:00:00 1000 mL via INTRAVENOUS

## 2017-07-13 MED ORDER — MORPHINE SULFATE 2 MG/ML IJ/IV SOLN (WRAP)
1.0000 mg | Freq: Four times a day (QID) | Status: DC | PRN
Start: 2017-07-13 — End: 2017-07-13
  Administered 2017-07-13: 1 mg via INTRAVENOUS
  Filled 2017-07-13: qty 1

## 2017-07-13 MED ORDER — PROPOFOL INFUSION 10 MG/ML
INTRAVENOUS | Status: DC | PRN
Start: 2017-07-13 — End: 2017-07-13
  Administered 2017-07-13: 150 mg via INTRAVENOUS

## 2017-07-13 MED ORDER — MIDAZOLAM HCL 2 MG/2ML IJ SOLN
INTRAMUSCULAR | Status: DC | PRN
Start: 2017-07-13 — End: 2017-07-13
  Administered 2017-07-13: 2 mg via INTRAVENOUS

## 2017-07-13 MED ORDER — FENTANYL CITRATE (PF) 50 MCG/ML IJ SOLN (WRAP)
25.0000 ug | INTRAMUSCULAR | Status: DC | PRN
Start: 2017-07-13 — End: 2017-07-13

## 2017-07-13 MED ORDER — LABETALOL HCL 5 MG/ML IV SOLN (WRAP)
INTRAVENOUS | Status: AC
Start: 2017-07-13 — End: ?
  Filled 2017-07-13: qty 20

## 2017-07-13 MED ORDER — FENTANYL CITRATE (PF) 50 MCG/ML IJ SOLN (WRAP)
INTRAMUSCULAR | Status: AC
Start: 2017-07-13 — End: ?
  Filled 2017-07-13: qty 2

## 2017-07-13 MED ORDER — LACTATED RINGERS IV SOLN
INTRAVENOUS | Status: DC | PRN
Start: 2017-07-13 — End: 2017-07-13

## 2017-07-13 MED ORDER — LACTATED RINGERS IV SOLN
100.0000 mL/h | INTRAVENOUS | Status: DC
Start: 2017-07-13 — End: 2017-07-14
  Administered 2017-07-13: 15:00:00 100 mL/h via INTRAVENOUS

## 2017-07-13 MED ORDER — BENZOCAINE 20% MT SOLN (WRAP)
1.0000 | Freq: Four times a day (QID) | OROMUCOSAL | Status: DC | PRN
Start: 2017-07-13 — End: 2017-07-22
  Administered 2017-07-17 – 2017-07-18 (×2): 1 via OROMUCOSAL
  Filled 2017-07-13: qty 57

## 2017-07-13 MED ORDER — SUCCINYLCHOLINE CHLORIDE 20 MG/ML IJ SOLN
INTRAMUSCULAR | Status: DC | PRN
Start: 2017-07-13 — End: 2017-07-13
  Administered 2017-07-13: 120 mg via INTRAVENOUS

## 2017-07-13 MED ORDER — HYDROMORPHONE HCL 1 MG/ML IJ SOLN
INTRAMUSCULAR | Status: AC
Start: 2017-07-13 — End: ?
  Filled 2017-07-13: qty 1

## 2017-07-13 MED ORDER — MORPHINE SULFATE 4 MG/ML IJ/IV SOLN (WRAP)
4.0000 mg | Status: AC | PRN
Start: 2017-07-13 — End: 2017-07-15
  Administered 2017-07-15 (×2): 4 mg via INTRAVENOUS
  Filled 2017-07-13 (×2): qty 1

## 2017-07-13 MED ORDER — SUCCINYLCHOLINE CHLORIDE 20 MG/ML IJ SOLN
INTRAMUSCULAR | Status: AC
Start: 2017-07-13 — End: ?
  Filled 2017-07-13: qty 10

## 2017-07-13 MED ORDER — ONDANSETRON 4 MG PO TBDP
4.0000 mg | ORAL_TABLET | Freq: Three times a day (TID) | ORAL | Status: DC | PRN
Start: 2017-07-13 — End: 2017-07-22

## 2017-07-13 MED ORDER — CEFAZOLIN SODIUM 1 G IJ SOLR
INTRAMUSCULAR | Status: AC
Start: 2017-07-13 — End: ?
  Filled 2017-07-13: qty 2000

## 2017-07-13 MED ORDER — ONDANSETRON HCL 4 MG/2ML IJ SOLN
4.0000 mg | Freq: Once | INTRAMUSCULAR | Status: DC | PRN
Start: 2017-07-13 — End: 2017-07-13

## 2017-07-13 MED ORDER — ONDANSETRON HCL 4 MG/2ML IJ SOLN
INTRAMUSCULAR | Status: DC | PRN
Start: 2017-07-13 — End: 2017-07-13
  Administered 2017-07-13: 4 mg via INTRAVENOUS

## 2017-07-13 MED ORDER — METRONIDAZOLE IN NACL 500 MG/100 ML IV SOLN
500.0000 mg | Freq: Three times a day (TID) | INTRAVENOUS | Status: AC
Start: 2017-07-13 — End: 2017-07-14
  Administered 2017-07-13 – 2017-07-14 (×3): 500 mg via INTRAVENOUS
  Filled 2017-07-13 (×2): qty 100

## 2017-07-13 MED ORDER — HYDROMORPHONE HCL 1 MG/ML IJ SOLN
0.4000 mg | INTRAMUSCULAR | Status: DC | PRN
Start: 2017-07-13 — End: 2017-07-13

## 2017-07-13 MED ORDER — FENTANYL CITRATE (PF) 50 MCG/ML IJ SOLN (WRAP)
INTRAMUSCULAR | Status: DC | PRN
Start: 2017-07-13 — End: 2017-07-13
  Administered 2017-07-13 (×4): 50 ug via INTRAVENOUS

## 2017-07-13 MED ORDER — LIDOCAINE HCL 2 % IJ SOLN
INTRAMUSCULAR | Status: DC | PRN
Start: 2017-07-13 — End: 2017-07-13
  Administered 2017-07-13: 80 mg

## 2017-07-13 MED ORDER — MIDAZOLAM HCL 2 MG/2ML IJ SOLN
INTRAMUSCULAR | Status: AC
Start: 2017-07-13 — End: ?
  Filled 2017-07-13: qty 2

## 2017-07-13 MED ORDER — DEXTROSE IN LACTATED RINGERS 5 % IV SOLN
INTRAVENOUS | Status: DC
Start: 2017-07-13 — End: 2017-07-14

## 2017-07-13 MED ORDER — SODIUM CHLORIDE 0.9 % IV SOLN
INTRAVENOUS | Status: DC
Start: 2017-07-13 — End: 2017-07-13

## 2017-07-13 MED ORDER — KETOROLAC TROMETHAMINE 30 MG/ML IJ SOLN
30.0000 mg | Freq: Once | INTRAMUSCULAR | Status: AC
Start: 2017-07-13 — End: 2017-07-13
  Administered 2017-07-13: 04:00:00 30 mg via INTRAVENOUS
  Filled 2017-07-13: qty 1

## 2017-07-13 MED ORDER — SODIUM CHLORIDE 0.45 % IV SOLN
INTRAVENOUS | Status: DC
Start: 2017-07-13 — End: 2017-07-16
  Administered 2017-07-13 – 2017-07-15 (×2): 100 mL/h via INTRAVENOUS

## 2017-07-13 MED ORDER — FENTANYL CITRATE (PF) 50 MCG/ML IJ SOLN (WRAP)
INTRAMUSCULAR | Status: AC
Start: 2017-07-13 — End: 2017-07-13
  Filled 2017-07-13: qty 2

## 2017-07-13 MED ORDER — ENOXAPARIN SODIUM 40 MG/0.4ML SC SOLN
40.0000 mg | Freq: Every day | SUBCUTANEOUS | Status: DC
Start: 2017-07-13 — End: 2017-07-22
  Administered 2017-07-13 – 2017-07-22 (×10): 40 mg via SUBCUTANEOUS
  Filled 2017-07-13 (×10): qty 0.4

## 2017-07-13 MED ORDER — ROCURONIUM BROMIDE 50 MG/5ML IV SOLN
INTRAVENOUS | Status: AC
Start: 2017-07-13 — End: ?
  Filled 2017-07-13: qty 5

## 2017-07-13 MED ORDER — LABETALOL HCL 5 MG/ML IV SOLN (WRAP)
INTRAVENOUS | Status: DC | PRN
Start: 2017-07-13 — End: 2017-07-13
  Administered 2017-07-13: 5 mg via INTRAVENOUS

## 2017-07-13 MED ORDER — NEOSTIGMINE METHYLSULFATE 1 MG/ML IJ/IV SOLN (WRAP)
Status: DC | PRN
Start: 2017-07-13 — End: 2017-07-13
  Administered 2017-07-13: 4 mg via INTRAVENOUS

## 2017-07-13 MED ORDER — KETOROLAC TROMETHAMINE 15 MG/ML IJ SOLN
15.0000 mg | Freq: Four times a day (QID) | INTRAMUSCULAR | Status: AC
Start: 2017-07-13 — End: 2017-07-14
  Administered 2017-07-13 – 2017-07-14 (×4): 15 mg via INTRAVENOUS
  Filled 2017-07-13 (×4): qty 1

## 2017-07-13 MED ORDER — METRONIDAZOLE IN NACL 500 MG/100 ML IV SOLN
INTRAVENOUS | Status: AC
Start: 2017-07-13 — End: ?
  Filled 2017-07-13: qty 100

## 2017-07-13 MED ORDER — ONDANSETRON HCL 4 MG/2ML IJ SOLN
4.0000 mg | Freq: Three times a day (TID) | INTRAMUSCULAR | Status: DC | PRN
Start: 2017-07-13 — End: 2017-07-22
  Administered 2017-07-15 – 2017-07-16 (×2): 4 mg via INTRAVENOUS
  Filled 2017-07-13 (×2): qty 2

## 2017-07-13 MED ORDER — LIDOCAINE VISCOUS 2 % MT SOLN
10.0000 mL | Freq: Once | OROMUCOSAL | Status: AC
Start: 2017-07-13 — End: 2017-07-13
  Administered 2017-07-13: 03:00:00 10 mL via OROMUCOSAL
  Filled 2017-07-13: qty 15

## 2017-07-13 MED ORDER — FAMOTIDINE 10 MG/ML IV SOLN (WRAP)
20.0000 mg | Freq: Two times a day (BID) | INTRAVENOUS | Status: DC
Start: 2017-07-13 — End: 2017-07-22
  Administered 2017-07-13 – 2017-07-19 (×13): 20 mg via INTRAVENOUS
  Filled 2017-07-13 (×13): qty 2

## 2017-07-13 MED ORDER — PROPOFOL 10 MG/ML IV EMUL (WRAP)
INTRAVENOUS | Status: AC
Start: 2017-07-13 — End: ?
  Filled 2017-07-13: qty 20

## 2017-07-13 MED ORDER — HYDRALAZINE HCL 20 MG/ML IJ SOLN
10.0000 mg | Freq: Four times a day (QID) | INTRAMUSCULAR | Status: DC | PRN
Start: 2017-07-13 — End: 2017-07-22
  Administered 2017-07-13 – 2017-07-21 (×11): 10 mg via INTRAVENOUS
  Filled 2017-07-13 (×11): qty 1

## 2017-07-13 MED ORDER — NALOXONE HCL 0.4 MG/ML IJ SOLN (WRAP)
0.2000 mg | INTRAMUSCULAR | Status: DC | PRN
Start: 2017-07-13 — End: 2017-07-22

## 2017-07-13 MED ORDER — LIDOCAINE HCL (PF) 2 % IJ SOLN
INTRAMUSCULAR | Status: AC
Start: 2017-07-13 — End: ?
  Filled 2017-07-13: qty 5

## 2017-07-13 MED ORDER — FAMOTIDINE 20 MG PO TABS
20.0000 mg | ORAL_TABLET | Freq: Two times a day (BID) | ORAL | Status: DC
Start: 2017-07-13 — End: 2017-07-22
  Administered 2017-07-20 – 2017-07-22 (×5): 20 mg via ORAL
  Filled 2017-07-13 (×5): qty 1

## 2017-07-13 MED ORDER — ALUM & MAG HYDROXIDE-SIMETH 200-200-20 MG/5ML PO SUSP
30.0000 mL | Freq: Once | ORAL | Status: AC
Start: 2017-07-13 — End: 2017-07-13
  Administered 2017-07-13: 03:00:00 30 mL via ORAL
  Filled 2017-07-13: qty 30

## 2017-07-13 MED ORDER — IOHEXOL 350 MG/ML IV SOLN
100.0000 mL | Freq: Once | INTRAVENOUS | Status: AC | PRN
Start: 2017-07-13 — End: 2017-07-13
  Administered 2017-07-13: 04:00:00 100 mL via INTRAVENOUS

## 2017-07-13 MED ORDER — EPHEDRINE SULFATE 50 MG/ML IJ/IV SOLN (WRAP)
Status: DC | PRN
Start: 2017-07-13 — End: 2017-07-13
  Administered 2017-07-13: 10 mg via INTRAVENOUS

## 2017-07-13 MED ORDER — METRONIDAZOLE IN NACL 500 MG/100 ML IV SOLN
500.0000 mg | Freq: Three times a day (TID) | INTRAVENOUS | Status: DC
Start: 2017-07-13 — End: 2017-07-13

## 2017-07-13 MED ORDER — ONDANSETRON HCL 4 MG/2ML IJ SOLN
INTRAMUSCULAR | Status: AC
Start: 2017-07-13 — End: ?
  Filled 2017-07-13: qty 2

## 2017-07-13 MED ORDER — GLYCOPYRROLATE 0.2 MG/ML IJ SOLN
INTRAMUSCULAR | Status: DC | PRN
Start: 2017-07-13 — End: 2017-07-13
  Administered 2017-07-13: .6 mg via INTRAVENOUS

## 2017-07-13 MED ORDER — DEXTROSE 5 % IV SOLN
2.0000 g | Freq: Three times a day (TID) | INTRAVENOUS | Status: DC
Start: 2017-07-13 — End: 2017-07-13
  Administered 2017-07-13: 11:00:00 2 g via INTRAVENOUS

## 2017-07-13 MED ORDER — DEXAMETHASONE SODIUM PHOSPHATE 4 MG/ML IJ SOLN
INTRAMUSCULAR | Status: AC
Start: 2017-07-13 — End: ?
  Filled 2017-07-13: qty 1

## 2017-07-13 MED ORDER — NEOSTIGMINE METHYLSULFATE 1 MG/ML IJ/IV SOLN (WRAP)
Status: AC
Start: 2017-07-13 — End: ?
  Filled 2017-07-13: qty 10

## 2017-07-13 MED ORDER — HYDROMORPHONE HCL 1 MG/ML IJ SOLN
INTRAMUSCULAR | Status: DC | PRN
Start: 2017-07-13 — End: 2017-07-13
  Administered 2017-07-13: .5 mg via INTRAVENOUS
  Administered 2017-07-13: 0.5 mg via INTRAVENOUS

## 2017-07-13 MED ORDER — SODIUM CHLORIDE 0.9 % IV MBP
500.0000 mg | Freq: Three times a day (TID) | INTRAVENOUS | Status: DC
Start: 2017-07-13 — End: 2017-07-13

## 2017-07-13 MED ORDER — GLYCOPYRROLATE 0.2 MG/ML IJ SOLN
INTRAMUSCULAR | Status: AC
Start: 2017-07-13 — End: ?
  Filled 2017-07-13: qty 3

## 2017-07-13 MED ORDER — CEFAZOLIN SODIUM-DEXTROSE 1-4 GM-% IV SOLR
1.0000 g | Freq: Three times a day (TID) | INTRAVENOUS | Status: AC
Start: 2017-07-13 — End: 2017-07-14
  Administered 2017-07-13 – 2017-07-14 (×2): 1 g via INTRAVENOUS
  Filled 2017-07-13 (×2): qty 50

## 2017-07-13 MED ORDER — ACETAMINOPHEN 325 MG PO TABS
650.0000 mg | ORAL_TABLET | ORAL | Status: DC | PRN
Start: 2017-07-13 — End: 2017-07-22

## 2017-07-13 MED ORDER — ROCURONIUM BROMIDE 10 MG/ML IV SOLN (WRAP)
INTRAVENOUS | Status: DC | PRN
Start: 2017-07-13 — End: 2017-07-13
  Administered 2017-07-13: 45 mg via INTRAVENOUS
  Administered 2017-07-13: 5 mg via INTRAVENOUS

## 2017-07-13 MED ORDER — LACTATED RINGERS IV SOLN
INTRAVENOUS | Status: DC | PRN
Start: 2017-07-13 — End: 2017-07-14

## 2017-07-13 MED ORDER — BENZOCAINE-MENTHOL 15-3.6 MG MT LOZG
1.0000 | LOZENGE | OROMUCOSAL | Status: DC | PRN
Start: 2017-07-13 — End: 2017-07-22

## 2017-07-13 SURGICAL SUPPLY — 51 items
APPLCATOR CHLORAPREP 26ML (Prep) ×2 IMPLANT
BARRIER ADH SOD HALUR CMC SPRFLM 6X5IN (Sheet) ×2 IMPLANT
BARRIER ADHESION L6 IN X W5 IN SEPRAFILM SODIUM HYALURONATE (Sheet) ×1 IMPLANT
BLADE SURG SAFETYLOCK STRL 15 (Blade) ×2 IMPLANT
DRAPE SRG TBRN CNVRT 122X106X77IN LF (Drape) ×2 IMPLANT
DRAPE SURGICAL IMPERVIOUS REINFORCEMENT FENESTRATE ABSORBENT ARMBOARD (Drape) ×1 IMPLANT
DRESSING TELFA STERILE 3X4 (Dressing) ×2 IMPLANT
GLOVE SURG BIOGEL PF LTX SZ7.0 (Glove) ×2 IMPLANT
INSTRUMENT SUCTION NON VENTED LARGE EYED SHEATH POOLE 0035040 (Tubes) ×1 IMPLANT
PAD ELECTROSRG GRND REM W CRD (Procedure Accessories) ×2 IMPLANT
RELOADER SINGLE-USE 60-3.5 (Staplers)
SEALER/DIVIDER ELECTROSURGICAL L18 CM 14 D 180 D L34 MM CURVE JAW OVAL (Instrument) IMPLANT
SEALER/DIVIDER ESURG 14D 180D 34MM LGSR (Instrument) IMPLANT
SOL IRR 0.9% NACL 500ML PLS PR BTL ISTNC (Irrigation Solutions) ×1
SOLUTION IRR 0.9% NACL 500ML LF STRL PLS (Irrigation Solutions) ×1
SOLUTION IRRIGATION 0.9% SDM CHLORIDE 500ML PR BTTL ISOTONIC NONPRGNC (Irrigation Solutions) ×1 IMPLANT
SOLUTION IRRIGATION 0.9% SODIUM CHLORIDE (Irrigation Solutions) ×1 IMPLANT
SPONGE GAUZE L4 IN X W4 IN 12 PLY ANTIMICROBIAL HYPOALLERGENIC CURITY (Sponge) ×1 IMPLANT
SPONGE GZE PHMB CRTY AMD 4X4IN LF STRL (Sponge) ×2 IMPLANT
SPONGE LAP 18X18IN PREWASH WHT (Sponge)
SPONGE LAPAROTOMY L18 IN X W18 IN (Sponge) IMPLANT
SPONGE LAPAROTOMY L18 IN X W18 IN PREWASH WHITE (Sponge) IMPLANT
STAPLER LDNG GIA 80-3.8 (Staplers) IMPLANT
STAPLER RLD SGL USE GIA 80-3.8 (Staplers) IMPLANT
STAPLER THICK TISSUE L60 MM X H3.5 MM (Staplers) IMPLANT
STAPLER THICK TISSUE L60 MM X H3.5 MM ENDOSCOPIC 2 ROW LINEAR CUTTER 2 (Staplers) IMPLANT
SUTURE ABS 0 VCL STPK 18IN BRD TIE 6 (Suture) ×2
SUTURE ABS 1 CT1 PDS2 36IN MFL VIOL (Suture) ×2
SUTURE ABS 2-0 CT1 VCL 27IN BRD COAT (Suture) ×2
SUTURE ABS 2-0 VCL STPK 18IN BRD TIE 12 (Suture) ×2
SUTURE ABS 3-0 VCL STPK 18IN BRD TIE 12 (Suture) ×2
SUTURE COATED VICRYL 0 L18 IN BRAID TIES (Suture) ×1 IMPLANT
SUTURE COATED VICRYL 0 L18 IN BRAID TIES 6 STRAND COATED VIOLET (Suture) ×1 IMPLANT
SUTURE COATED VICRYL 2-0 CT-1 L27 IN (Suture) ×1 IMPLANT
SUTURE COATED VICRYL 2-0 CT-1 L27 IN BRAID COATED VIOLET ABSORBABLE (Suture) ×1 IMPLANT
SUTURE COATED VICRYL 2-0 L18 IN BRAID (Suture) ×1
SUTURE COATED VICRYL 2-0 L18 IN BRAID TIES 12 STRAND COATED VIOLET (Suture) ×1 IMPLANT
SUTURE COATED VICRYL 3-0 L18 IN BRAID (Suture) ×1
SUTURE COATED VICRYL 3-0 L18 IN BRAID TIES 12 STRAND COATED VIOLET (Suture) ×1 IMPLANT
SUTURE NABSB SLK 3-0 PRMHND 30IN BRD TIE (Suture) ×2
SUTURE NABSB SLK 3-0 SH PRMHND 18IN CR (Suture) ×2
SUTURE PDS II 1 CT-1 L36 IN MONOFILAMENT (Suture) ×1 IMPLANT
SUTURE PDS II 1 CT-1 L36 IN MONOFILAMENT VIOLET ABSORBABLE (Suture) ×1 IMPLANT
SUTURE SILK 2-0 SH 30IN (Suture) ×2 IMPLANT
SUTURE SILK PERMA HAND BLACK 3-0 L30 IN (Suture) ×1 IMPLANT
SUTURE SILK PERMA HAND BLACK 3-0 SH L18 (Suture) ×1 IMPLANT
SUTURE SILK PERMA HAND BLACK 3-0 SH L18 IN CONTROL RELEASE BRAID 8 (Suture) ×1 IMPLANT
SUTURE SILK PERMA HAND BLCK 3-0 L30 IN BRD TIES 12 STRND PRCT NNBSRBBL (Suture) ×1 IMPLANT
SUTURE VICRYL 0 CT1 36IN (Suture) ×2 IMPLANT
TRAY LAPAROTOMY (Pack) ×2 IMPLANT
TUBING POOLE SUCTION DEVICE (Tubes) ×2

## 2017-07-13 NOTE — Plan of Care (Addendum)
Pt left to OR at this time.Report given to receiving nurse Re Tobi Bastos and made her aware that pt had profuse Emesis after Morphine given for pain.CHG bath given prior to pt leaving the unit.Pt with NGT to right nares.

## 2017-07-13 NOTE — H&P (Signed)
ADMISSION HISTORY AND PHYSICAL EXAM    Date Time: 07/13/17 9:01 AM  Patient Name: Neil Frederick  Attending Physician: Neomia Dear*        History of Present Illness:   Neil Frederick is a 66 y.o. male  's medical history significant for diabetes mellitus, hypertension and osteoarthritis who presents to the hospital with acute onset of abdominal distention and pain which started about 24 hours ago.  He initially noted a bloating on his abdomen diffusely followed by a acute abdominal pain again diffusely.  He had to force a bowel movement yesterday.  He denies any diarrhea, but he felt nauseous but no vomiting.  Came to the emergency department early this morning and was noted to have terminal ileum obstruction.  He has mildly elevated WBC count.    Past Medical History:     Past Medical History:   Diagnosis Date   . Arthritis    . Diabetes mellitus without complication    . Hypercholesteremia    . Hypertension    . Osteoarthritis    . Psoriasis        Past Surgical History:     Past Surgical History:   Procedure Laterality Date   . KNEE SURGERY     . KNEE SURGERY         Family History:     Family History   Problem Relation Age of Onset   . Stroke Mother    . Heart attack Father        Social History:     Social History     Social History   . Marital status: Unknown     Spouse name: N/A   . Number of children: N/A   . Years of education: N/A     Social History Main Topics   . Smoking status: Former Games developer   . Smokeless tobacco: Never Used   . Alcohol use Yes   . Drug use: No   . Sexual activity: Not on file     Other Topics Concern   . Not on file     Social History Narrative   . No narrative on file       Allergies:   No Known Allergies    Medications:     Prescriptions Prior to Admission   Medication Sig Dispense Refill Last Dose   . Fluticasone Propionate (FLONASE NA) by Nasal route.      . lansoprazole (PREVACID) 30 MG capsule Take 30 mg by mouth daily.      . montelukast (SINGULAIR) 10 MG tablet Take  10 mg by mouth nightly.      . Multiple Vitamins-Minerals (AIRBORNE PO) Take by mouth.      . Naproxen-Esomeprazole 500-20 MG Tablet Delayed Response Take by mouth.      . Ustekinumab (STELARA SC) Inject into the skin.      Marland Kitchen alfuzosin (UROXATRAL) 10 MG 24 hr tablet    Taking   . alfuzosin (UROXATRAL) 10 MG 24 hr tablet alfuzosin ER 10 mg tablet,extended release 24 hr      . amLODIPine (NORVASC) 10 MG tablet    Taking   . Ascorbic Acid (VITAMIN C) 1000 MG tablet Take 1,000 mg by mouth daily.   Taking   . aspirin EC 81 MG EC tablet Take 81 mg by mouth daily.   Taking   . atorvastatin (LIPITOR) 40 MG tablet    Taking   . azelastine (ASTELIN) 0.1 % nasal spray 2  sprays by Nasal route 2 (two) times daily. Use in each nostril as directed   Taking   . B Complex-Biotin-FA (B-COMPLEX PO) Take by mouth daily.   Taking   . Cholecalciferol (VITAMIN D3) 2000 UNITS Tab Take by mouth daily.   Taking   . Coenzyme Q10 300 MG Cap Take by mouth.   Taking   . enalapril (VASOTEC) 5 MG tablet    Taking   . GARCINIA CAMBOGIA-CHROMIUM PO Take by mouth.   Taking   . Glucosamine-Chondroitin-MSM (TRIPLE FLEX PO) Take by mouth daily. 2 tablets Daily   Taking   . JANUVIA 100 MG tablet    Taking   . meloxicam (MOBIC) 7.5 MG tablet Take 7.5 mg by mouth daily.      . Multiple Vitamin (MULTIVITAMIN) tablet Take 1 tablet by mouth daily.   Taking   . Omega-3 Fatty Acids (EQL OMEGA 3 FISH OIL) 1400 MG Cap Take 1,400 mg by mouth.   Taking         Review of Systems:     A comprehensive review of systems was: History obtained from the patient    General: negative for - fever, chills, fatigue, malaise, night sweats,  weight changes  HEENT: negative for - headaches, nasal congestion, oral lesions, sinus pain, sore throat,  vertigo, visual changes.  Pulm: negative for - cough, hemoptysis, pain, SOB, sputum change, wheezing  CVS: negative for - chest pain, dyspnea, edema,orthopnea, palpitations, Pnd  GI: negative for - As per HPI   GU: negative for -  dysuria, hematuria, incontine, freqnceuency, urgency  Msk: negative for - joint pain, joint stiffness, joint swelling, muscle pain   Neuro: negative for - confusion, dizziness, gait disturbance,memory loss, numbness/tingling, seizures  Heme: negative for - bleeding, blood clots, bruising, jaundice, pallor  Endoc: negative for - lethargy, polydipsia, polyuria, temp intolerance         Physical Exam:     Vitals:    07/13/17 0635   BP: (!) 180/98   Pulse: 69   Resp: 16   Temp: 97.3 F (36.3 C)   SpO2: 97%       General appearance - alert, well appearing, and in no distress, normal appearing weight  Eyes - pupils equal and reactive, extraocular eye movements intact  Mouth - mucous membranes moist, pharynx normal without lesions  Neck - supple, no significant adenopathy  Chest - clear to auscultation, no wheezes, rales or rhonchi, symmetric air entry  Heart - normal rate, regular rhythm, normal S1, S2, no murmurs, rubs, clicks or gallops  Abdomen - diffuse abdominal tenderness, hypoactive bowel sounds.  Positive guarding and rebound.  Neurological - alert, oriented, normal speech, non focal exam  Musculoskeletal - no joint tenderness, deformity or swelling  Extremities - peripheral pulses normal, no pedal edema, no clubbing or cyanosis  Skin - normal coloration and turgor, no rashes, no suspicious skin lesions noted    Labs:     Results     Procedure Component Value Units Date/Time    Comprehensive Metabolic Panel (CMP) [[id number]]  (Abnormal) Collected:  07/13/17 0304    Specimen:  Blood Updated:  07/13/17 0330     Glucose 171 (H) mg/dL      BUN 65.7 mg/dL      Creatinine 1.2 mg/dL      Sodium 846 mEq/L      Potassium 4.0 mEq/L      Chloride 105 mEq/L      CO2 24 mEq/L  Calcium 11.0 (H) mg/dL      Protein, Total 8.8 (H) g/dL      Albumin 4.9 g/dL      AST (SGOT) 39 (H) U/L      ALT 29 U/L      Alkaline Phosphatase 84 U/L      Bilirubin, Total 0.5 mg/dL      Globulin 3.9 (H) g/dL      Albumin/Globulin Ratio 1.3      Anion Gap 13.0    Lipase [[id number]] Collected:  07/13/17 0304    Specimen:  Blood Updated:  07/13/17 0330     Lipase 34 U/L     GFR [[id number]] Collected:  07/13/17 0304     Updated:  07/13/17 0330     EGFR >60.0    UA, Reflex to Microscopic [[id number]]  (Abnormal) Collected:  07/13/17 0304    Specimen:  Urine Updated:  07/13/17 0327     Urine Type Clean Catch     Color, UA Amber (A)     Clarity, UA Clear     Specific Gravity UA >1.030     Urine pH 5.5     Leukocyte Esterase, UA Negative     Nitrite, UA Negative     Protein, UR 30 (A)     Glucose, UA Negative     Ketones UA Negative     Urobilinogen, UA 0.2 mg/dL      Bilirubin, UA Negative     Blood, UA Negative     RBC, UA 0-2 /hpf      WBC, UA 0-5 /hpf      Hyaline Casts, UA TNTC (A) /lpf      Urine Mucus Present    CBC and differential [[id number]]  (Abnormal) Collected:  07/13/17 0304    Specimen:  Blood from Blood Updated:  07/13/17 0310     WBC 12.85 (H) x10 3/uL      Hgb 15.0 g/dL      Hematocrit 62.9 %      Platelets 279 x10 3/uL      RBC 4.70 x10 6/uL      MCV 93.2 fL      MCH 31.9 pg      MCHC 34.2 g/dL      RDW 15 %      MPV 10.0 fL      Neutrophils 91.3 %      Lymphocytes Automated 4.5 %      Monocytes 3.7 %      Eosinophils Automated 0.1 %      Basophils Automated 0.2 %      Immature Granulocyte 0.2 %      Nucleated RBC 0.0 /100 WBC      Neutrophils Absolute 11.74 (H) x10 3/uL      Abs Lymph Automated 0.58 x10 3/uL      Abs Mono Automated 0.47 x10 3/uL      Abs Eos Automated 0.01 x10 3/uL      Absolute Baso Automated 0.02 x10 3/uL      Absolute Immature Granulocyte 0.03 x10 3/uL      Absolute NRBC 0.00 x10 3/uL             Rads:     Radiological Procedure reviewed.  Radiology Results (24 Hour)     Procedure Component Value Units Date/Time    Chest AP Portable [[id number]] Collected:  07/13/17 0544    Order Status:  Completed Updated:  07/13/17 0549    Narrative:  Examination: Portable chest.    HISTORY: NG tube placement.    COMPARISON: None.       Impression:         Eventration left hemidiaphragm.  Enteric catheter tip and side-port in stomach.  Low lung volumes.  Bibasilar atelectasis.  Gaseous bowel loops.    Adaline Sill, MD   07/13/2017 5:45 AM    CT Abd/ Pelvis with IV Contrast [[id number]] Collected:  07/13/17 0353    Order Status:  Completed Updated:  07/13/17 0404    Narrative:       History: Pain, distention.        Comparison: None.    Technique: Axial CT abdomen pelvis with 100 cc Omnipaque 350 intravenous  contrast.    A combination of automatic exposure control, adjustment of the mA and/or  kV according to patient size and/or use of iterative reconstruction  technique was utilized.    Findings:       Lung bases: Clear.    Liver: Unremarkable.    Gallbladder: Unremarkable. No radiodense stones.    Pancreas: Unremarkable.    Spleen: Unremarkable.    Adrenal glands: Unremarkable.    Kidneys: Unremarkable. No stones, fat stranding, or hydronephrosis.    Stomach: Distended with air debris level.    Duodenum: Fluid-filled distention.     Small bowel: Diffuse small bowel distention with multiple air-fluid  levels. Abrupt transition at distal ileal loops near or at terminal  ileum. Associated twist of mesentery and vasculature.    Large bowel: Decompressed.      Lymph nodes: Unremarkable.     Aorta: Normal course and caliber. Atherosclerotic.    Bladder: Decompressed.    Genitals: Unremarkable.    Bones: No suspicious osseous lesions.    Other: None.      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    Recommendation:  Surgical consult.    Adaline Sill, MD   07/13/2017 4:00 AM        Assessment :   Small bowel obstruction, rule out ischemic bowel.  Leukocytosis.  Abdominal pain secondary to small bowel obstruction.  Diabetes mellitus.  Hypertension      Plan  :   Admit to inpatient.  Discussed was Dr. Ignacia Bayley from surgery.  Emergency laparotomy.    Start meropenem and Flagyl.  IV fluid hydration.  Sliding scale insulin.  Blood pressure  control.  Deep vein thrombosis prophylaxis        Sonny Dandy, MD  07/13/2017  .9:01 AM

## 2017-07-13 NOTE — Anesthesia Postprocedure Evaluation (Signed)
Anesthesia Post Evaluation    Patient: Neil Frederick    Procedures performed: Procedure(s):  EXPLORATORY LAPAROTOMY,  lysis of adhesions    Anesthesia type: General ETT    Patient location:Phase I PACU    Last vitals:   BP 175/75, P 70, RR 14, T 97.8    Post pain: pain manageable     Mental Status:awake    Respiratory Function: tolerating room air    Cardiovascular: stable    Nausea/Vomiting: patient not complaining of nausea or vomiting    Hydration Status: adequate    Post assessment: no apparent anesthetic complications    Signed by: Karie Fetch, 07/13/2017 12:50 PM

## 2017-07-13 NOTE — ED Notes (Signed)
Physician states radiologic study confirms NG tube placement, PTS ready to leave ED

## 2017-07-13 NOTE — UM Notes (Signed)
Primary Payor: [employer]   Admit to Inpatient (Order [id number]) 07/13/17 4098         66 y.o. male presenting to the ED with cramping generalized abd pain and bloating x 4.5 hours ago. The pain is worse with movement. He says that he ate popcorn six hours ago and thinks it had something to do with sxs. He had a BM  2.5 hours ago without improvement and tried to induce vomiting without relief of sxs.         97.3 F (36.3 C) -- 58 98 % Monitor 41  170/92     Chest AP Portable (Final result)   Result time 07/13/17 05:45:56   Final result by Noni Saupe, MD (07/13/17 05:45:56)                Impression:      Eventration left hemidiaphragm.  Enteric catheter tip and side-port in stomach.  Low lung volumes.  Bibasilar atelectasis.  Gaseous bowel loops.              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                    Er meds:  07/13/2017 0308 sodium chloride 0.9 % bolus 1,000 mL 1,000 mL Intravenous [address] Marrion Coy, [place]   07/13/2017 0308 lidocaine viscous (XYLOCAINE) 2 % solution 10 mL 10 mL Mouth/Throat Given Marrion Coy, RN   07/13/2017 1191 alum & mag hydroxide-simethicone (MAALOX PLUS) 200-200-20 mg/5 mL suspension 30 mL 30 mL Oral Given Marrion Coy, RN   07/13/2017 0335 ketorolac (TORADOL) injection 30 mg 30 mg Intravenous Given          Date of Operation:   07/13/2017    Operative Procedure:   Procedure(s):  EXPLORATORY LAPAROTOMY,  lysis of adhesions    Preoperative Diagnosis:   Pre-Op Diagnosis Codes:     * Intestinal volvulus [K56.2]    Postoperative Diagnosis:   Small bowel volvulus with small bowel obstruction        11/3 nsg-HD#0 SBO c/o Abd pain    S/P Day #0 EXPLORATORY LAPAROTOMY, lysis of adhesions.Dressing=TELFA 4X4 SPONGES TAPE    A/Ox4, RA (NGT=-R nare), -n/v, -gas, abdominal distention,  voids, self care, NPO/ice chips/sips    18g right wrist, Accucheck AcHs    Pain:scheduled Toradol    VTE:SCD's    Homero Fellers, RN, BSN, ACM, CCM  Utilization Review Nurse  [facility]  [phone] [facility] Staff ONLY)  [phone] (Carriers)    Please submit all clinical review requests via fax to [phone]

## 2017-07-13 NOTE — Brief Op Note (Signed)
BRIEF OP NOTE    Date Time: 07/13/17 11:45 AM    Patient Name:   Neil Frederick    Date of Operation:   07/13/2017    Providers Performing:   Surgeon(s):  Smith Mince, MD    Assistant (s):   Circulator: Vashisht, Marya Fossa, RN  Scrub Person: Staci Acosta  First Assistant: Denzil Hughes    Operative Procedure:   Procedure(s):  EXPLORATORY LAPAROTOMY,  lysis of adhesions    Preoperative Diagnosis:   Pre-Op Diagnosis Codes:     * Intestinal volvulus [K56.2]    Postoperative Diagnosis:   Small bowel volvulus with small bowel obstruction    Anesthesia:   General    Estimated Blood Loss:   Minimal.    Implants:     Implant Name Type Inv. Item Serial No. Manufacturer Lot No. LRB No. Used Action   BARRIER SEPRAFILM ADSN 5X6IN - [id number] Sheet BARRIER SEPRAFILM ADSN 5X6IN   [employer] [id number] N/A 1 Implanted       Specimens:       Findings:   Small bowel volvulus with small bowel obstruction    Complications:   None.      Signed by: Smith Mince, MD                                                                           [facility]

## 2017-07-13 NOTE — Op Note (Signed)
dictated

## 2017-07-13 NOTE — ED Notes (Signed)
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 male admitted with a diagnosis of:    1. Volvulus       Isolation: None  Holding Orders Confirmed:  YES   NURSING CARE   Mental Status alert   ADL ADLs:            Independent with all ADLs  Ambulation:  no difficulty   Pertinent Information  and Safety Concerns NG Tube 33F      VITAL SIGNS     Time of Last Set of Vitals 0540   Temperature 98.4   BP 170/92   Pulse 66   Respirations 16   Pulse OX 98%        IV LINES     IV Catheter Size: 20 g    Peripheral IV 07/13/17 Left Antecubital (Active)   Site Assessment Clean;Dry;Intact 07/13/2017  2:59 AM   Line Status Saline Locked;Flushed;Blood return noted 07/13/2017  2:59 AM   Dressing Status Clean;Dry;Intact 07/13/2017  2:59 AM   Number of days: 0          LAB RESULTS     Labs Reviewed   CBC AND DIFFERENTIAL - Abnormal; Notable for the following:        Result Value    WBC 12.85 (*)     Neutrophils Absolute 11.74 (*)     All other components within normal limits   COMPREHENSIVE METABOLIC PANEL - Abnormal; Notable for the following:     Glucose 171 (*)     Calcium 11.0 (*)     Protein, Total 8.8 (*)     AST (SGOT) 39 (*)     Globulin 3.9 (*)     All other components within normal limits   URINALYSIS, REFLEX TO MICROSCOPIC EXAM IF INDICATED - Abnormal; Notable for the following:     Color, UA Amber (*)     Protein, UR 30 (*)     Hyaline Casts, UA TNTC (*)     All other components within normal limits   LIPASE   GFR

## 2017-07-13 NOTE — PACU (Signed)
Transfer criteria met. On transfer patient easily arousable. Respirations even and unlabored. IV patent. Vital signs stable. Patient reports pain level as tolerable. Surgical dressing clean, dry and intact. Report called to receiving RN Lurena Joiner. Patient transported via Bed with transporter. Patient's family at bedside upon transfer.

## 2017-07-13 NOTE — Plan of Care (Signed)
Problem: Safety  Goal: Patient will be free from injury during hospitalization  Outcome: Progressing   07/13/17 1811   Goal/Interventions addressed this shift   Patient will be free from injury during hospitalization  Assess patient's risk for falls and implement fall prevention plan of care per policy;Provide and maintain safe environment;Use appropriate transfer methods;Ensure appropriate safety devices are available at the bedside;Include patient/ family/ care giver in decisions related to safety;Hourly rounding     Goal: Patient will be free from infection during hospitalization  Outcome: Progressing   07/13/17 1811   Goal/Interventions addressed this shift   Free from Infection during hospitalization Assess and monitor for signs and symptoms of infection;Monitor lab/diagnostic results;Monitor all insertion sites (i.e. indwelling lines, tubes, urinary catheters, and drains)       Problem: Pain  Goal: Pain at adequate level as identified by patient  Outcome: Progressing   07/13/17 1811   Goal/Interventions addressed this shift   Pain at adequate level as identified by patient Identify patient comfort function goal;Assess pain on admission, during daily assessment and/or before any "as needed" intervention(s);Assess for risk of opioid induced respiratory depression, including snoring/sleep apnea. Alert healthcare team of risk factors identified.;Reassess pain within 30-60 minutes of any procedure/intervention, per Pain Assessment, Intervention, Reassessment (AIR) Cycle;Offer non-pharmacological pain management interventions;Evaluate if patient comfort function goal is met;Evaluate patient's satisfaction with pain management progress       Problem: Side Effects from Pain Analgesia  Goal: Patient will experience minimal side effects of analgesic therapy  Outcome: Progressing   07/13/17 1811   Goal/Interventions addressed this shift   Patient will experience minimal side effects of analgesic therapy Monitor/assess  patient's respiratory status (RR depth, effort, breath sounds);Assess for changes in cognitive function;Prevent/manage side effects per LIP orders (i.e. nausea, vomiting, pruritus, constipation, urinary retention, etc.);Evaluate for opioid-induced sedation with appropriate assessment tool (i.e. POSS)       Problem: Altered GI Function  Goal: Fluid and electrolyte balance are achieved/maintained  Outcome: Progressing   07/13/17 1811   Goal/Interventions addressed this shift   Fluid and electrolyte balance are achieved/maintained Monitor intake and output every shift;Monitor/assess lab values and report abnormal values;Provide adequate hydration;Assess for confusion/personality changes;Monitor daily weight;Assess and reassess fluid and electrolyte status     Goal: Elimination patterns are normal or improving  Outcome: Progressing   07/13/17 1811   Goal/Interventions addressed this shift   Elimination patterns are normal or improving Report abnormal assessment to physician;Assess for normal bowel sounds;Anticipate/assist with toileting needs;Monitor for abdominal distension;Monitor for abdominal discomfort;Assess for signs and symptoms of bleeding. Report signs of bleeding to physician;Administer treatments as ordered     Goal: Nutritional intake is adequate  Outcome: Progressing   07/13/17 1811   Goal/Interventions addressed this shift   Nutritional intake is adequate Allow adequate time for meals;Encourage/perform oral hygiene as appropriate;Include patient/patient care companion in decisions related to nutrition;Assist patient with meals/food selection     Goal: Mobility/Activity is maintained at optimal level for patient  Outcome: Progressing   07/13/17 1811   Goal/Interventions addressed this shift   Mobility/activity is maintained at optimal level for patient Increase mobility as tolerated/progressive mobility;Encourage independent activity per ability;Maintain proper body alignment;Perform active/passive  ROM;Plan activities to conserve energy, plan rest periods;Reposition patient every 2 hours and as needed unless able to reposition self     Goal: No bleeding  Outcome: Progressing   07/13/17 1811   Goal/Interventions addressed this shift   No bleeding  Monitor and assess vitals and hemodynamic parameters;Monitor/assess  lab values and report abnormal values;Assess for bruising/petechia

## 2017-07-13 NOTE — Consults (Signed)
SURGICAL CONSULTATION    Date Time: 07/13/17 10:25 AM  Patient Name: Neil Frederick  Surgical Attending: Smith Mince, MD     History of Present Illness:   Neil Frederick is a 66 y.o. male who presents to the hospital with severe abdominal pain with nausea, vomiting and bloating    Past Medical History:     Past Medical History:   Diagnosis Date   . Arthritis    . Diabetes mellitus without complication    . Hypercholesteremia    . Hypertension    . Osteoarthritis    . Psoriasis        Past Surgical History:     Past Surgical History:   Procedure Laterality Date   . KNEE SURGERY     . KNEE SURGERY         Family History:     Family History   Problem Relation Age of Onset   . Stroke Mother    . Heart attack Father        Social History:     Social History     Social History   . Marital status: Unknown     Spouse name: N/A   . Number of children: N/A   . Years of education: N/A     Social History Main Topics   . Smoking status: Former Games developer   . Smokeless tobacco: Never Used   . Alcohol use Yes   . Drug use: No   . Sexual activity: Not on file     Other Topics Concern   . Not on file     Social History Narrative   . No narrative on file       Allergies:   No Known Allergies    Medications:     Prescriptions Prior to Admission   Medication Sig   . Fluticasone Propionate (FLONASE NA) by Nasal route.   . lansoprazole (PREVACID) 30 MG capsule Take 30 mg by mouth daily.   . montelukast (SINGULAIR) 10 MG tablet Take 10 mg by mouth nightly.   . Multiple Vitamins-Minerals (AIRBORNE PO) Take by mouth.   . Naproxen-Esomeprazole 500-20 MG Tablet Delayed Response Take by mouth.   . Ustekinumab (STELARA SC) Inject into the skin.   Marland Kitchen alfuzosin (UROXATRAL) 10 MG 24 hr tablet    . alfuzosin (UROXATRAL) 10 MG 24 hr tablet alfuzosin ER 10 mg tablet,extended release 24 hr   . amLODIPine (NORVASC) 10 MG tablet    . Ascorbic Acid (VITAMIN C) 1000 MG tablet Take 1,000 mg by mouth daily.   Marland Kitchen aspirin EC 81 MG EC tablet Take 81 mg by mouth  daily.   Marland Kitchen atorvastatin (LIPITOR) 40 MG tablet    . azelastine (ASTELIN) 0.1 % nasal spray 2 sprays by Nasal route 2 (two) times daily. Use in each nostril as directed   . B Complex-Biotin-FA (B-COMPLEX PO) Take by mouth daily.   . Cholecalciferol (VITAMIN D3) 2000 UNITS Tab Take by mouth daily.   . Coenzyme Q10 300 MG Cap Take by mouth.   . enalapril (VASOTEC) 5 MG tablet    . GARCINIA CAMBOGIA-CHROMIUM PO Take by mouth.   . Glucosamine-Chondroitin-MSM (TRIPLE FLEX PO) Take by mouth daily. 2 tablets Daily   . JANUVIA 100 MG tablet    . meloxicam (MOBIC) 7.5 MG tablet Take 7.5 mg by mouth daily.   . Multiple Vitamin (MULTIVITAMIN) tablet Take 1 tablet by mouth daily.   . Omega-3 Fatty Acids (EQL OMEGA  3 FISH OIL) 1400 MG Cap Take 1,400 mg by mouth.       Review of Systems:   A comprehensive review of systems was: History obtained from the patient  General ROS: Negative  Respiratory ROS: no cough, shortness of breath, or wheezing  Cardiovascular ROS: no chest pain or dyspnea on exertion  Gastrointestinal ROS: no abdominal pain, change in bowel habits, or black or bloody stools  Genito-Urinary ROS: no dysuria, trouble voiding, or hematuria  Dermatological ROS: negative    Physical Exam:     Vitals:    07/13/17 0936   BP:    Pulse:    Resp:    Temp: 98.2 F (36.8 C)   SpO2:        Intake and Output Summary (Last 24 hours) at Date Time    Intake/Output Summary (Last 24 hours) at 07/13/17 1025  Last data filed at 07/13/17 0549   Gross per 24 hour   Intake                0 ml   Output              100 ml   Net             -100 ml       Physical Exam:     General: awake, alert, oriented x 3; no acute distress.  Neck: supple, no lymphadenopathy, no thyromegaly  Cardiovascular: regular rate and rhythm, no murmurs, rubs or gallops  Lungs: clear to auscultation bilaterally, without wheezing, rhonchi, or rales  Abdomen:  diffusely-tender, distended; no palpable masses, no hepatosplenomegaly, hypoactive bowel sounds,  significant rebound and guarding;  Skin: no rashes or lesions noted  Other:     Labs:     Results     Procedure Component Value Units Date/Time    Glucose Whole Blood - POCT [[id number]]  (Abnormal) Collected:  07/13/17 0908     Updated:  07/13/17 0922     POCT - Glucose Whole blood 148 (H) mg/dL     Comprehensive Metabolic Panel (CMP) [[id number]]  (Abnormal) Collected:  07/13/17 0304    Specimen:  Blood Updated:  07/13/17 0330     Glucose 171 (H) mg/dL      BUN 59.5 mg/dL      Creatinine 1.2 mg/dL      Sodium 638 mEq/L      Potassium 4.0 mEq/L      Chloride 105 mEq/L      CO2 24 mEq/L      Calcium 11.0 (H) mg/dL      Protein, Total 8.8 (H) g/dL      Albumin 4.9 g/dL      AST (SGOT) 39 (H) U/L      ALT 29 U/L      Alkaline Phosphatase 84 U/L      Bilirubin, Total 0.5 mg/dL      Globulin 3.9 (H) g/dL      Albumin/Globulin Ratio 1.3     Anion Gap 13.0    Lipase [[id number]] Collected:  07/13/17 0304    Specimen:  Blood Updated:  07/13/17 0330     Lipase 34 U/L     GFR [[id number]] Collected:  07/13/17 0304     Updated:  07/13/17 0330     EGFR >60.0    UA, Reflex to Microscopic [[id number]]  (Abnormal) Collected:  07/13/17 0304    Specimen:  Urine Updated:  07/13/17 0327     Urine Type Clean Catch  Color, UA Amber (A)     Clarity, UA Clear     Specific Gravity UA >1.030     Urine pH 5.5     Leukocyte Esterase, UA Negative     Nitrite, UA Negative     Protein, UR 30 (A)     Glucose, UA Negative     Ketones UA Negative     Urobilinogen, UA 0.2 mg/dL      Bilirubin, UA Negative     Blood, UA Negative     RBC, UA 0-2 /hpf      WBC, UA 0-5 /hpf      Hyaline Casts, UA TNTC (A) /lpf      Urine Mucus Present    CBC and differential [[id number]]  (Abnormal) Collected:  07/13/17 0304    Specimen:  Blood from Blood Updated:  07/13/17 0310     WBC 12.85 (H) x10 3/uL      Hgb 15.0 g/dL      Hematocrit 41.3 %      Platelets 279 x10 3/uL      RBC 4.70 x10 6/uL      MCV 93.2 fL      MCH 31.9 pg      MCHC 34.2 g/dL      RDW 15 %      MPV  10.0 fL      Neutrophils 91.3 %      Lymphocytes Automated 4.5 %      Monocytes 3.7 %      Eosinophils Automated 0.1 %      Basophils Automated 0.2 %      Immature Granulocyte 0.2 %      Nucleated RBC 0.0 /100 WBC      Neutrophils Absolute 11.74 (H) x10 3/uL      Abs Lymph Automated 0.58 x10 3/uL      Abs Mono Automated 0.47 x10 3/uL      Abs Eos Automated 0.01 x10 3/uL      Absolute Baso Automated 0.02 x10 3/uL      Absolute Immature Granulocyte 0.03 x10 3/uL      Absolute NRBC 0.00 x10 3/uL           Rads:     Radiology Results (24 Hour)     Procedure Component Value Units Date/Time    Chest AP Portable [[id number]] Collected:  07/13/17 0544    Order Status:  Completed Updated:  07/13/17 0549    Narrative:       Examination: Portable chest.    HISTORY: NG tube placement.    COMPARISON: None.      Impression:         Eventration left hemidiaphragm.  Enteric catheter tip and side-port in stomach.  Low lung volumes.  Bibasilar atelectasis.  Gaseous bowel loops.    Adaline Sill, MD   07/13/2017 5:45 AM    CT Abd/ Pelvis with IV Contrast [[id number]] Collected:  07/13/17 0353    Order Status:  Completed Updated:  07/13/17 0404    Narrative:       History: Pain, distention.        Comparison: None.    Technique: Axial CT abdomen pelvis with 100 cc Omnipaque 350 intravenous  contrast.    A combination of automatic exposure control, adjustment of the mA and/or  kV according to patient size and/or use of iterative reconstruction  technique was utilized.    Findings:       Lung bases: Clear.  Liver: Unremarkable.    Gallbladder: Unremarkable. No radiodense stones.    Pancreas: Unremarkable.    Spleen: Unremarkable.    Adrenal glands: Unremarkable.    Kidneys: Unremarkable. No stones, fat stranding, or hydronephrosis.    Stomach: Distended with air debris level.    Duodenum: Fluid-filled distention.     Small bowel: Diffuse small bowel distention with multiple air-fluid  levels. Abrupt transition at distal ileal loops near  or at terminal  ileum. Associated twist of mesentery and vasculature.    Large bowel: Decompressed.      Lymph nodes: Unremarkable.     Aorta: Normal course and caliber. Atherosclerotic.    Bladder: Decompressed.    Genitals: Unremarkable.    Bones: No suspicious osseous lesions.    Other: None.      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    Recommendation:  Surgical consult.    Adaline Sill, MD   07/13/2017 4:00 AM          Additional Studies:         Radiological Procedure reviewed.    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

## 2017-07-13 NOTE — ED Provider Notes (Signed)
EMERGENCY DEPARTMENT HISTORY AND PHYSICAL EXAM     Physician/Midlevel provider first contact with patient: 07/13/17 0241         Date: 07/13/2017  Patient Name: Neil Frederick    History of Presenting Illness     Chief Complaint   Patient presents with   . Abdominal Pain     History Provided By: Pt    Chief Complaint: Abd pain and bloating  Duration: 4.5 hours ago  Timing: Constant  Location: Generalized  Quality: Cramping  Severity: Moderate  Exacerbating Factors: Pain worse with movement  Alleviating Factors: None  Associated Symptoms: None   Pertinent Negatives: Hx of abdominal surgeries    Additional History: Neil Frederick is a 66 y.o. male presenting to the ED with cramping generalized abd pain and bloating x 4.5 hours ago. The pain is worse with movement. He says that he ate popcorn six hours ago and thinks it had something to do with sxs. He had a BM  2.5 hours ago without improvement and tried to induce vomiting without relief of sxs.    PCP: Marvene Staff, MD  Specialist: Unknown     Current Facility-Administered Medications   Medication Dose Route Frequency Provider Last Rate Last Dose   . alum & mag hydroxide-simethicone (MAALOX PLUS) 200-200-20 mg/5 mL suspension 30 mL  30 mL Oral Once Sharyne Richters, MD       . lidocaine viscous (XYLOCAINE) 2 % solution 10 mL  10 mL Mouth/Throat Once Sharyne Richters, MD       . sodium chloride 0.9 % bolus 1,000 mL  1,000 mL Intravenous Once Sharyne Richters, MD         Current Outpatient Prescriptions   Medication Sig Dispense Refill   . Fluticasone Propionate (FLONASE NA) by Nasal route.     . lansoprazole (PREVACID) 30 MG capsule Take 30 mg by mouth daily.     . montelukast (SINGULAIR) 10 MG tablet Take 10 mg by mouth nightly.     . Multiple Vitamins-Minerals (AIRBORNE PO) Take by mouth.     . Naproxen-Esomeprazole 500-20 MG Tablet Delayed Response Take by mouth.     . Ustekinumab (STELARA SC) Inject into the skin.     Marland Kitchen alfuzosin (UROXATRAL) 10  MG 24 hr tablet      . alfuzosin (UROXATRAL) 10 MG 24 hr tablet alfuzosin ER 10 mg tablet,extended release 24 hr     . amLODIPine (NORVASC) 10 MG tablet      . Ascorbic Acid (VITAMIN C) 1000 MG tablet Take 1,000 mg by mouth daily.     Marland Kitchen aspirin EC 81 MG EC tablet Take 81 mg by mouth daily.     Marland Kitchen atorvastatin (LIPITOR) 40 MG tablet      . azelastine (ASTELIN) 0.1 % nasal spray 2 sprays by Nasal route 2 (two) times daily. Use in each nostril as directed     . B Complex-Biotin-FA (B-COMPLEX PO) Take by mouth daily.     . Cholecalciferol (VITAMIN D3) 2000 UNITS Tab Take by mouth daily.     . Coenzyme Q10 300 MG Cap Take by mouth.     . enalapril (VASOTEC) 5 MG tablet      . GARCINIA CAMBOGIA-CHROMIUM PO Take by mouth.     . Glucosamine-Chondroitin-MSM (TRIPLE FLEX PO) Take by mouth daily. 2 tablets Daily     . JANUVIA 100 MG tablet      . meloxicam (MOBIC) 7.5 MG tablet Take 7.5  mg by mouth daily.     . Multiple Vitamin (MULTIVITAMIN) tablet Take 1 tablet by mouth daily.     . Omega-3 Fatty Acids (EQL OMEGA 3 FISH OIL) 1400 MG Cap Take 1,400 mg by mouth.         Past History     Past Medical History:  Past Medical History:   Diagnosis Date   . Arthritis    . Diabetes mellitus without complication    . Hypercholesteremia    . Hypertension    . Osteoarthritis    . Psoriasis        Past Surgical History:  Past Surgical History:   Procedure Laterality Date   . KNEE SURGERY     . KNEE SURGERY         Family History:  Family History   Problem Relation Age of Onset   . Stroke Mother    . Heart attack Father        Social History:  Social History   Substance Use Topics   . Smoking status: Former Games developer   . Smokeless tobacco: Never Used   . Alcohol use Yes       Allergies:  No Known Allergies    Review of Systems     Review of Systems   Constitutional: Negative for fever.   Gastrointestinal: Positive for abdominal distention and abdominal pain.   Allergic/Immunologic:        NKDA   All other systems reviewed and are  negative.      Physical Exam   BP 154/85   Pulse 65   Temp 97.8 F (36.6 C) (Oral)   Resp 14   Ht 5\' 5"  (1.651 m)   Wt 81.6 kg   SpO2 97%   BMI 29.95 kg/m     Physical Exam   Constitutional: Patient is alert.  Well nourished.  NAD  Head: Atraumatic.   Eyes: EOMI. PERRL  ENT:  MMM.   Neck:  FROM. No spinal tenderness. Neck supple.    Cardiovascular: Normal rate and regular rhythm.   Pulmonary/Chest: Effort normal and breath sounds normal. No respiratory distress.   Abdominal: Distention.  There is upper abd tenderness. Bowel sounds diminished.   Musculoskeletal:  No lower extremity edema or tenderness.    Neurological: Patient is alert and oriented to person, place, and time.  No focal deficits.   Skin: Skin is warm and dry.    Diagnostic Study Results     Labs -     Results     ** No results found for the last 24 hours. **          Radiologic Studies -   Radiology Results (24 Hour)     ** No results found for the last 24 hours. **      .      Medical Decision Making   I am the first provider for this patient.    I reviewed the vital signs, available nursing notes, past medical history, past surgical history, family history and social history.    Vital Signs-Reviewed the patient's vital signs.     Patient Vitals for the past 12 hrs:   BP Temp Pulse Resp   07/13/17 0230 154/85 97.8 F (36.6 C) 65 14       Pulse Oximetry Analysis - Normal, 97% on RA    Old Medical Records: Nursing notes.     EKG:  Interpreted by the Emergency Physician.   Time Interpreted: 32  Rate: 63   Rhythm: Normal Sinus Rhythm    Interpretation: No ST/T wave changes   Comparison: Compared to EKG 09/28/2007- predominately unchanged.    Clinical Decision Support:       ED Course:     2:46 AM - Pt agreeable to ED plan, including having lab work performed.    4:13 AM - D/w results and need for hospitalization.  Page to Dr. Yisroel Ramming (on call Gen Surgery).     4:29 AM - D/w Dr. Catalina Antigua accepts patient for Dr. Eston Mould.     6:20 AM - D/w Dr.  Yisroel Ramming, will consult.     Provider Notes: Abd pain and bloating.  No abdominal surgery.  CT w/volvulus.      For Hospitalized Patients:    1. Hospitalization Decision Time:  The decision to admit this patient was made by the emergency provider at 1615 on 07/13/2017     2. Aspirin: Aspirin was not given because the patient did not present with a stroke at the time of their Emergency Department evaluation.    Diagnosis     Clinical Impression:   1. Volvulus        Treatment Plan:   ED Disposition     ED Disposition Condition Date/Time Comment    Admit  Sat Jul 13, 2017  4:12 AM Admitting Physician: Eston Mould, SAMIR A [[id number]]   Diagnosis: Volvulus [560.2.ICD-9-CM]   Estimated Length of Stay: > or = to 2 midnights   Tentative Discharge Plan?: Home or Self Care [1]   Patient Class: Inpatient [101]   Bed request comments: Surgical unit if possible          _______________________________    Attestations:   This note is prepared by Marcille Blanco, acting as Scribe for Ulice Bold, MD.    Ulice Bold, MD:  The scribe's documentation has been prepared under my direction and personally reviewed by me in its entirety.  I confirm that the note above accurately reflects all work, treatment, procedures, and medical decision making performed by me.    _______________________________             Sharyne Richters, MD  07/13/17 2049

## 2017-07-13 NOTE — Plan of Care (Signed)
Received pt from PACU to room at this time.Pt is situated in bed,says pain to mid abdomen is 2/10 and is tolerable. Pt is A*Ox4, VSS,except high SBP in 160's-will recheck and monitor. NGTv connected to LCS.Foley to gravity draining clear yellow urine.Fall precaution maintained,call bell within reached,and hourly round

## 2017-07-13 NOTE — Transfer of Care (Signed)
Anesthesia Transfer of Care Note    Patient: Neil Frederick    Procedures performed: Procedure(s):  EXPLORATORY LAPAROTOMY,  lysis of adhesions    Anesthesia type: General ETT    Patient location:Phase I PACU    Last vitals:   Vitals:    07/13/17 1151   BP: 173/87   Pulse: 65   Resp: 16   Temp: 36.3 C (97.4 F)   SpO2: 95%       Post pain: Patient not complaining of pain, continue current therapy      Mental Status:awake    Respiratory Function: tolerating nasal cannula    Cardiovascular: stable    Nausea/Vomiting: patient not complaining of nausea or vomiting    Hydration Status: adequate    Post assessment: no apparent anesthetic complications, no reportable events and no evidence of recall    Signed by: Corliss Parish Toan Mort  07/13/17 11:51 AM

## 2017-07-13 NOTE — Progress Notes (Signed)
Admission:  Patient was transferred from [facility]. Pt is A/Ox4, RA, NGT in R nare, -n/v,-gas, pain is 8/10,abd distension, VS(BP high 180/78). Patient has med hx of HTN, DM, Hypercholesteremia, Osteoarthritis.

## 2017-07-13 NOTE — Anesthesia Preprocedure Evaluation (Addendum)
Anesthesia Evaluation    AIRWAY    Mallampati: III    TM distance: <3 FB  Neck ROM: limited  Mouth Opening:full   CARDIOVASCULAR    cardiovascular exam normal       DENTAL         PULMONARY    pulmonary exam normal     OTHER FINDINGS    NG in place draining                      Anesthesia Plan    ASA 3 - emergent     general                     Detailed anesthesia plan: general endotracheal            informed consent obtained               GETA    Signed by: Karie Fetch 07/13/17 9:14 AM        ANESTHESIA HISTORY AND PHYSICAL EXAM    Date Time: 07/13/17 9:15 AM; Evaluation by: Karie Fetch MD  Patient Name: Neil Frederick  Attending Physician: Neomia Dear*  Admission Date:07/13/2017      Assessment:      Patient Active Problem List   Diagnosis   . Subarachnoid cyst   . Left hip pain   . Volvulus       Anesthesia Assessment and Plan:   1. GETA    History of Present Illness:   Neil Frederick is a 66 y.o. male who presents to the hospital with abdominal distension and abdominal pain         Past Medical History:   Diagnosis Date   . Arthritis    . Diabetes mellitus without complication    . Hypercholesteremia    . Hypertension    . Osteoarthritis    . Psoriasis             Past Surgical History:   Procedure Laterality Date   . KNEE SURGERY     . KNEE SURGERY         Anesthetic Family History:   Not abnormal       Social History   Substance Use Topics   . Smoking status: Former Games developer   . Smokeless tobacco: Never Used   . Alcohol use Yes      Allergies:   No Known Allergies  Hospital Medications:     Current Facility-Administered Medications   Medication Dose Route Frequency     Home Medications:     Prescriptions Prior to Admission   Medication Sig Dispense Refill Last Dose   . Fluticasone Propionate (FLONASE NA) by Nasal route.      . lansoprazole (PREVACID) 30 MG capsule Take 30 mg by mouth daily.      . montelukast (SINGULAIR) 10 MG tablet Take 10 mg by mouth nightly.      . Multiple  Vitamins-Minerals (AIRBORNE PO) Take by mouth.      . Naproxen-Esomeprazole 500-20 MG Tablet Delayed Response Take by mouth.      . Ustekinumab (STELARA SC) Inject into the skin.      Marland Kitchen alfuzosin (UROXATRAL) 10 MG 24 hr tablet    Taking   . alfuzosin (UROXATRAL) 10 MG 24 hr tablet alfuzosin ER 10 mg tablet,extended release 24 hr      . amLODIPine (NORVASC) 10 MG tablet    Taking   . Ascorbic  Acid (VITAMIN C) 1000 MG tablet Take 1,000 mg by mouth daily.   Taking   . aspirin EC 81 MG EC tablet Take 81 mg by mouth daily.   Taking   . atorvastatin (LIPITOR) 40 MG tablet    Taking   . azelastine (ASTELIN) 0.1 % nasal spray 2 sprays by Nasal route 2 (two) times daily. Use in each nostril as directed   Taking   . B Complex-Biotin-FA (B-COMPLEX PO) Take by mouth daily.   Taking   . Cholecalciferol (VITAMIN D3) 2000 UNITS Tab Take by mouth daily.   Taking   . Coenzyme Q10 300 MG Cap Take by mouth.   Taking   . enalapril (VASOTEC) 5 MG tablet    Taking   . GARCINIA CAMBOGIA-CHROMIUM PO Take by mouth.   Taking   . Glucosamine-Chondroitin-MSM (TRIPLE FLEX PO) Take by mouth daily. 2 tablets Daily   Taking   . JANUVIA 100 MG tablet    Taking   . meloxicam (MOBIC) 7.5 MG tablet Take 7.5 mg by mouth daily.      . Multiple Vitamin (MULTIVITAMIN) tablet Take 1 tablet by mouth daily.   Taking   . Omega-3 Fatty Acids (EQL OMEGA 3 FISH OIL) 1400 MG Cap Take 1,400 mg by mouth.   Taking       Labs:       Recent Labs  Lab 07/13/17  0304   WBC 12.85*   Hgb 15.0   Hematocrit 43.8   Platelets 279          Lab Results   Component Value Date    NA 142 07/13/2017    CL 105 07/13/2017    CO2 24 07/13/2017    K 4.0 07/13/2017    CREAT 1.2 07/13/2017    BUN 13.0 07/13/2017    GLU 171 (H) 07/13/2017       Lab Results   Component Value Date    AST 39 (H) 07/13/2017    ALT 29 07/13/2017       Lab Results   Component Value Date    PT 13.9 (H) 09/29/2007    PTT 27 09/29/2007    INR 1.2 (H) 09/29/2007       No results found for: HCGQUANT    Rads:      Radiology Results (24 Hour)     Procedure Component Value Units Date/Time    Chest AP Portable [[id number]] Collected:  07/13/17 0544    Order Status:  Completed Updated:  07/13/17 0549    Narrative:       Examination: Portable chest.    HISTORY: NG tube placement.    COMPARISON: None.      Impression:         Eventration left hemidiaphragm.  Enteric catheter tip and side-port in stomach.  Low lung volumes.  Bibasilar atelectasis.  Gaseous bowel loops.    Adaline Sill, MD   07/13/2017 5:45 AM    CT Abd/ Pelvis with IV Contrast [[id number]] Collected:  07/13/17 0353    Order Status:  Completed Updated:  07/13/17 0404    Narrative:       History: Pain, distention.        Comparison: None.    Technique: Axial CT abdomen pelvis with 100 cc Omnipaque 350 intravenous  contrast.    A combination of automatic exposure control, adjustment of the mA and/or  kV according to patient size and/or use of iterative reconstruction  technique was utilized.  Findings:       Lung bases: Clear.    Liver: Unremarkable.    Gallbladder: Unremarkable. No radiodense stones.    Pancreas: Unremarkable.    Spleen: Unremarkable.    Adrenal glands: Unremarkable.    Kidneys: Unremarkable. No stones, fat stranding, or hydronephrosis.    Stomach: Distended with air debris level.    Duodenum: Fluid-filled distention.     Small bowel: Diffuse small bowel distention with multiple air-fluid  levels. Abrupt transition at distal ileal loops near or at terminal  ileum. Associated twist of mesentery and vasculature.    Large bowel: Decompressed.      Lymph nodes: Unremarkable.     Aorta: Normal course and caliber. Atherosclerotic.    Bladder: Decompressed.    Genitals: Unremarkable.    Bones: No suspicious osseous lesions.    Other: None.      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    Recommendation:  Surgical consult.    Adaline Sill, MD   07/13/2017 4:00 AM          EKG:   completed      ECHO/CATH REPORT:         Consults  Since Admission:

## 2017-07-13 NOTE — ED Notes (Signed)
Physician awaiting return call from surgeon

## 2017-07-14 LAB — BASIC METABOLIC PANEL
Anion Gap: 9 (ref 5.0–15.0)
BUN: 17 mg/dL (ref 9.0–28.0)
CO2: 22 mEq/L (ref 22–29)
Calcium: 8.7 mg/dL (ref 8.5–10.5)
Chloride: 110 mEq/L (ref 100–111)
Creatinine: 1.1 mg/dL (ref 0.7–1.3)
Glucose: 115 mg/dL — ABNORMAL HIGH (ref 70–100)
Potassium: 4 mEq/L (ref 3.5–5.1)
Sodium: 141 mEq/L (ref 136–145)

## 2017-07-14 LAB — GLUCOSE WHOLE BLOOD - POCT
Whole Blood Glucose POCT: 112 mg/dL — ABNORMAL HIGH (ref 70–100)
Whole Blood Glucose POCT: 110 mg/dL — ABNORMAL HIGH (ref 70–100)
Whole Blood Glucose POCT: 109 mg/dL — ABNORMAL HIGH (ref 70–100)
Whole Blood Glucose POCT: 118 mg/dL — ABNORMAL HIGH (ref 70–100)

## 2017-07-14 LAB — CBC AND DIFFERENTIAL
Absolute NRBC: 0 10*3/uL
Basophils Absolute Automated: 0.02 10*3/uL (ref 0.00–0.20)
Basophils Automated: 0.3 %
Eosinophils Absolute Automated: 0.08 10*3/uL (ref 0.00–0.70)
Eosinophils Automated: 1.2 %
Hematocrit: 40.7 % — ABNORMAL LOW (ref 42.0–52.0)
Hgb: 13.5 g/dL (ref 13.0–17.0)
Immature Granulocytes Absolute: 0.02 10*3/uL
Immature Granulocytes: 0.3 %
Lymphocytes Absolute Automated: 0.91 10*3/uL (ref 0.50–4.40)
Lymphocytes Automated: 13.5 %
MCH: 31.3 pg (ref 28.0–32.0)
MCHC: 33.2 g/dL (ref 32.0–36.0)
MCV: 94.2 fL (ref 80.0–100.0)
MPV: 10.7 fL (ref 9.4–12.3)
Monocytes Absolute Automated: 0.79 10*3/uL (ref 0.00–1.20)
Monocytes: 11.7 %
Neutrophils Absolute: 4.91 10*3/uL (ref 1.80–8.10)
Neutrophils: 73 %
Nucleated RBC: 0 /100 WBC (ref 0.0–1.0)
Platelets: 237 10*3/uL (ref 140–400)
RBC: 4.32 10*6/uL — ABNORMAL LOW (ref 4.70–6.00)
RDW: 15 % (ref 12–15)
WBC: 6.73 10*3/uL (ref 3.50–10.80)

## 2017-07-14 LAB — GFR: EGFR: >60

## 2017-07-14 LAB — HEMOLYSIS INDEX: Hemolysis Index: 2 (ref 0–18)

## 2017-07-14 NOTE — Progress Notes (Signed)
Much improved, no more pain reported except some incisional pain  Status post laparotomy with lysis of adhesions, detorsion of small bowel volvulus  Remove NG tube  Start clears   But await full return of bowel function  Pain control  Ambulating  Await return of bowel function  Progressing very well

## 2017-07-14 NOTE — Progress Notes (Signed)
INTERNAL MEDICINE PROGRESS NOTE    Progress Note - Rod Mae    Date Time: 07/14/17 10:35 AM  [name]      Assessment :   Small bowel obstruction, rule out ischemic bowel.  Leukocytosis.  Abdominal pain secondary to small bowel obstruction.  Diabetes mellitus.  Hypertension      Plan:        Patient postop day 1 status post exploratory laparotomy for Small bowel volvulus with small bowel.   Continue NG suction prior surgery.   Nothing by mouth   Pain control   GI prophylaxis   DVT prophylaxis   Discussed plan of care with patient and family.   Discussed plan of care with nurses.   Discussed case with consultants.    Subjective:   Event for the last 24 hours has been reviewed.  Patient had a status post exploratory laparotomy.    Review of Systems:     General ROS: No fatigue, no generalized aches or pains.   HEENT: No eye pain , redness or discharge. No nasal congestion  Respiratory ROS: No cough, SOB.  Cardiovascular ROS: No chest pain, palpitation ,orthopnea or PND  Gastrointestinal ROS: No abdominal pain, diarrhea, nausea or vomiting  Genito-Urinary ROS: No hematuria, dysuria, urgency or frequency.  Musculoskeletal ROS: No myalgia or arthralgia.  Neurological ROS: No dizziness, headache or weakness. No aphasia or slurred speech    Physical Exam:     Vitals:    07/14/17 0713   BP: (!) 169/92   Pulse:    Resp: 17   Temp: 99.1 F (37.3 C)   SpO2: 94%       Intake/Output Summary (Last 24 hours) at 07/14/17 1035  Last data filed at 07/14/17 0600   Gross per 24 hour   Intake          3121.67 ml   Output             1305 ml   Net          1816.67 ml       General appearance - alert, well appearing, and in no distress  Eyes - pupils equal and reactive, extraocular eye movements intact  Mouth - mucous membranes moist, pharynx normal without lesions  Neck - supple, no significant adenopathy  Chest - clear to auscultation, no wheezes, rales or rhonchi,  symmetric air entry  Heart - normal rate, regular rhythm, normal S1, S2, no murmurs, rubs, clicks or gallops  Abdomen - Distended, hypoactive bowel sounds with ventral surgical incision.no masses or organomegaly  Neurological - alert, oriented, normal speech, no focal deficits.  Extremities - peripheral pulses normal, no pedal edema, no clubbing or cyanosis  Skin - normal coloration and turgor, no rashes, no suspicious skin lesions noted      Meds Reviewed: Yes  Medications:   Medications:   Scheduled Meds: PRN Meds:        enoxaparin 40 mg Subcutaneous Daily   famotidine 20 mg Oral Q12H [facility]   Or      famotidine 20 mg Intravenous Q12H [facility]   ketorolac 15 mg Intravenous 4 times per day         Continuous Infusions:   . sodium chloride 100 mL/hr (07/13/17 2017)          acetaminophen 650 mg Q4H PRN   benzocaine 1 spray 4X Daily PRN   benzocaine-menthol 1 lozenge Q1H PRN   hydrALAZINE 10  mg Q6H PRN   morphine 4 mg Q3H PRN   naloxone 0.2 mg PRN   ondansetron 4 mg Q8H PRN   Or     ondansetron 4 mg Q8H PRN             Labs:   CHEMISTRY:     Recent Labs  Lab 07/14/17  0417 07/13/17  0304   Glucose 115* 171*   BUN 17.0 13.0   Creatinine 1.1 1.2   Calcium 8.7 11.0*   Sodium 141 142   Potassium 4.0 4.0   Chloride 110 105   CO2 22 24   Albumin  --  4.9   AST (SGOT)  --  39*   ALT  --  29   Bilirubin, Total  --  0.5   Alkaline Phosphatase  --  84           Invalid input(s):  AMYLASE          @LABRCNTIP (TSH:3,FRET3:3,FREET4):3)@    HEMATOLOGY:    Recent Labs  Lab 07/14/17  0417 07/13/17  0304   WBC 6.73 12.85*   Hgb 13.5 15.0   Hematocrit 40.7* 43.8   MCV 94.2 93.2   MCH 31.3 31.9   MCHC 33.2 34.2   Platelets 237 279           Invalid input(s):  APTT  Invalid input(s): ARTERIAL BLOOD GAS    URINALYSIS:    Recent Labs  Lab 07/13/17  0304   Urine Type Clean Catch   Color, UA Amber*   Clarity, UA Clear   Specific Gravity UA >1.030   Urine pH 5.5   Nitrite, UA Negative   Ketones UA Negative   Urobilinogen, UA 0.2   Bilirubin, UA  Negative   Blood, UA Negative   RBC, UA 0-2   WBC, UA 0-5       MICROBIOLOGY:  Microbiology Results     None            IRadiology:   Radiological Procedure reviewed.  Ct Abd/ Pelvis With Iv Contrast    Result Date: 07/13/2017  Small bowel volvulus at level terminal ileum. Recommendation: Surgical consult. Adaline Sill, MD 07/13/2017 4:00 AM    Chest Ap Portable    Result Date: 07/13/2017  Eventration left hemidiaphragm. Enteric catheter tip and side-port in stomach. Low lung volumes. Bibasilar atelectasis. Gaseous bowel loops. Adaline Sill, MD 07/13/2017 5:45 AM      Leonia Corona Karenann Cai, MD  07/14/2017  10:35 AM

## 2017-07-14 NOTE — Op Note (Signed)
Procedure Date: 07/13/2017     Patient Type: I     SURGEON: Smith Mince MD  ASSISTANT:       PREOPERATIVE DIAGNOSIS:  Volvulus of small bowel with small-bowel obstruction and early ischemia.     POSTOPERATIVE DIAGNOSIS:  Volvulus of small bowel with small-bowel obstruction and early ischemia.     TITLE OF PROCEDURE:  Exploratory laparotomy with detorsion of small bowel volvulus with lysis of  adhesions.  No complications.     ANESTHESIA:  General with endotracheal intubation.     COMPLICATIONS:  No complications.     DESCRIPTION OF PROCEDURE:  The patient was taken to the OR table and placed on the OR table in the  usual supine position.  The abdomen was prepped and draped in usual sterile  fashion after the induction of general anesthesia.  A midline laparotomy  incision was made with a 15-blade knife.  This was done in a limited manner  where the incision was carried through the subcutaneous tissue, linea alba  was identified.  A safe entry into peritoneal cavity was achieved.  Upon  entry into peritoneal cavity, very distended loops of small bowel were  encountered immediately after entry.  Care was taken to protect the  intestine at all times.  A retractor was placed in position.  We were able  to eviscerate the small bowel starting with a complete run of the small  bowel from the ligament of Treitz.  The entire small bowel was essentially  distended up to the point where there was the volvulus at the base of the  mesentery closer to the terminal ileum and the ileocecal area.  There were  adhesions that were involving the ileocecal junction.  Those adhesions have  eventually led to the thwirling of the base of the mesentery causing the  mesenteric volvulus which was evident on the CAT scan.  We were able to  release those adhesions, allowing [place] to detorse the small bowel at that  level rotating it away from the volvulus that it was in, allowing it  essentially to release the congestion that was present and  that allow for  return to the appearance of the small bowel.  Care was taken to release all  the adhesions associated with the detorsion and detorsed the small bowel  base of the mesentery without any problem.  No ischemic or gangrenous  changes noted just mild congestion that was released once the adhesions  were taken down and that also allow [place] again to detorse the small bowel in  the appropriate manner.  At this point, we were able to identify the  collapsed very distal portion of the ileum very close to the ileocecal  valve.  The appendix appeared to be normal.  No other mass effect or causes  of inflammatory adhesions were identified in the abdomen.  At this point,  we were able to run the small bowel backwards again from the ileocecal  valve after it was released and the decompressed bowel appeared to fill in  appropriately and fill in into the ileocecal area without any problems,  allowing [place] to again run the small intestine from the ileocecal valve by  placing all the small intestine appropriately all the way up to the  ligament of Treitz without any twisting and with full care to place the  small bowel in its proper way without any problems.  The area was irrigated  with saline and the rest of the  colon was examined.  There was no mass  effect appreciated.  The omentum was brought into view and used to cover  the entire abdomen.  A Seprafilm sheet was placed in the appropriate manner  and subsequently the abdomen was closed with double-stranded looped #1 PDS  suture from both ends of the incision, subcutaneous tissue was approximated  with 3-0 Vicryl suture and the skin with staples.  Dressing was applied.   The patient transferred to the recovery in stable condition.           D:  07/13/2017 12:06 PM by Dr. Smith Mince, MD [phone])  T:  07/14/2017 01:37 AM by NTS      (Conf: [id number]) (Doc ID: [id number])

## 2017-07-14 NOTE — Plan of Care (Signed)
Problem: Safety  Goal: Patient will be free from injury during hospitalization  Outcome: Progressing   07/14/17 0030   Goal/Interventions addressed this shift   Patient will be free from injury during hospitalization  Assess patient's risk for falls and implement fall prevention plan of care per policy;Provide and maintain safe environment;Use appropriate transfer methods;Ensure appropriate safety devices are available at the bedside;Include patient/ family/ care giver in decisions related to safety;Hourly rounding       Problem: Pain  Goal: Pain at adequate level as identified by patient  Outcome: Progressing   07/14/17 0030   Goal/Interventions addressed this shift   Pain at adequate level as identified by patient Identify patient comfort function goal;Assess for risk of opioid induced respiratory depression, including snoring/sleep apnea. Alert healthcare team of risk factors identified.;Assess pain on admission, during daily assessment and/or before any "as needed" intervention(s);Reassess pain within 30-60 minutes of any procedure/intervention, per Pain Assessment, Intervention, Reassessment (AIR) Cycle   Patient reports pain 2/10. No PRN pain medications given.     Problem: Side Effects from Pain Analgesia  Goal: Patient will experience minimal side effects of analgesic therapy  Outcome: Progressing   07/14/17 0030   Goal/Interventions addressed this shift   Patient will experience minimal side effects of analgesic therapy Monitor/assess patient's respiratory status (RR depth, effort, breath sounds);Assess for changes in cognitive function;Prevent/manage side effects per LIP orders (i.e. nausea, vomiting, pruritus, constipation, urinary retention, etc.)       Problem: Discharge Barriers  Goal: Patient will be discharged home or other facility with appropriate resources  Outcome: Progressing   07/14/17 0030   Goal/Interventions addressed this shift   Discharge to home or other facility with appropriate  resources Provide appropriate patient education;Provide information on available health resources;Initiate discharge planning       Problem: Psychosocial and Spiritual Needs  Goal: Demonstrates ability to cope with hospitalization/illness  Outcome: Progressing   07/14/17 0030   Goal/Interventions addressed this shift   Demonstrates ability to cope with hospitalizations/illness Encourage verbalization of feelings/concerns/expectations;Provide quiet environment;Assist patient to identify own strengths and abilities;Encourage patient to set small goals for self;Encourage participation in diversional activity       Problem: Altered GI Function  Goal: Fluid and electrolyte balance are achieved/maintained  Outcome: Progressing   07/14/17 0030   Goal/Interventions addressed this shift   Fluid and electrolyte balance are achieved/maintained Monitor intake and output every shift;Monitor/assess lab values and report abnormal values;Provide adequate hydration;Assess for confusion/personality changes;Assess and reassess fluid and electrolyte status     Goal: Elimination patterns are normal or improving  Outcome: Progressing   07/14/17 0030   Goal/Interventions addressed this shift   Elimination patterns are normal or improving Report abnormal assessment to physician;Anticipate/assist with toileting needs;Assess for normal bowel sounds;Monitor for abdominal distension;Monitor for abdominal discomfort   NGT remains in place, no bowel sounds and - flatus. No complaints of nausea or emesis.   Goal: Nutritional intake is adequate  Outcome: Progressing      Comments: Patient alert and oriented X 4. VSS NSR on tele. Placed on tele due to elevated SBP, medicated with IV hydralazine with good effect of pressure control. Abdominal dressing clean dry and intact. OOB with standby assist and ambulating in hallway with steady gait. Currently resting will continue to monitor

## 2017-07-14 NOTE — Plan of Care (Signed)
Problem: Safety  Goal: Patient will be free from injury during hospitalization  Outcome: Progressing   07/14/17 1308   Goal/Interventions addressed this shift   Patient will be free from injury during hospitalization  Assess patient's risk for falls and implement fall prevention plan of care per policy;Provide and maintain safe environment;Ensure appropriate safety devices are available at the bedside;Include patient/ family/ care giver in decisions related to safety;Use appropriate transfer methods

## 2017-07-14 NOTE — Plan of Care (Signed)
Problem: Pain  Goal: Pain at adequate level as identified by patient  Outcome: Progressing   07/14/17 0812   Goal/Interventions addressed this shift   Pain at adequate level as identified by patient Identify patient comfort function goal;Assess for risk of opioid induced respiratory depression, including snoring/sleep apnea. Alert healthcare team of risk factors identified.;Assess pain on admission, during daily assessment and/or before any "as needed" intervention(s);Evaluate if patient comfort function goal is met;Evaluate patient's satisfaction with pain management progress;Reassess pain within 30-60 minutes of any procedure/intervention, per Pain Assessment, Intervention, Reassessment (AIR) Cycle

## 2017-07-14 NOTE — Plan of Care (Signed)
Problem: Altered GI Function  Goal: Elimination patterns are normal or improving  Outcome: Progressing   07/14/17 0813   Goal/Interventions addressed this shift   Elimination patterns are normal or improving Monitor for abdominal distension;Assess for signs and symptoms of bleeding. Report signs of bleeding to physician;Report abnormal assessment to physician;Monitor for abdominal discomfort;Anticipate/assist with toileting needs;Assess for normal bowel sounds;Administer treatments as ordered;Consult/collaborate with Clinical Nutritionist

## 2017-07-14 NOTE — Plan of Care (Signed)
Problem: Safety  Goal: Patient will be free from injury during hospitalization  Outcome: Progressing   07/14/17 2128   Goal/Interventions addressed this shift   Patient will be free from injury during hospitalization  Provide and maintain safe environment;Include patient/ family/ care giver in decisions related to safety;Provide alternative method of communication if needed (communication boards, writing)   Fall/safety precautions maintained.    Problem: Pain  Goal: Pain at adequate level as identified by patient  Outcome: Progressing   07/14/17 2128   Goal/Interventions addressed this shift   Pain at adequate level as identified by patient Assess pain on admission, during daily assessment and/or before any "as needed" intervention(s);Reassess pain within 30-60 minutes of any procedure/intervention, per Pain Assessment, Intervention, Reassessment (AIR) Cycle;Offer non-pharmacological pain management interventions;Include patient/patient care companion in decisions related to pain management as needed;Evaluate if patient comfort function goal is met   Pt reports minimal pain 2/10. States he is comfortable at this time. Pt knows how to request for PRN if need be.Will continue to monitor and intervene as needed.    Problem: Altered GI Function  Goal: Elimination patterns are normal or improving  Outcome: Progressing   07/14/17 2128   Goal/Interventions addressed this shift   Elimination patterns are normal or improving Report abnormal assessment to physician;Anticipate/assist with toileting needs;Assess for normal bowel sounds;Monitor for abdominal discomfort;Monitor for abdominal distension;Assess for signs and symptoms of bleeding. Report signs of bleeding to physician;Assess for flatus;Administer treatments as ordered;Reinforce education on foods that improve and complicate bowel movements and how activity and medications can affect bowel movements;Administer medications to improve bowel evacuation as  prescribed;Encourage /perform oral hygiene as appropriate   Hypoactive bowel sounds. -flatus but burping. -bm. -n/v. Tolerating well clear liquid.Ambulates frequently. Abdomen tight and distended. Dressing to mid abdomen CDI. Will continue to monitor and encourage activities as tolerated.

## 2017-07-15 ENCOUNTER — Inpatient Hospital Stay: Payer: Medicare Other

## 2017-07-15 ENCOUNTER — Encounter: Payer: Self-pay | Admitting: Surgery

## 2017-07-15 LAB — ECG 12-LEAD
Atrial Rate: 63 {beats}/min
P Axis: 54 degrees
P-R Interval: 182 ms
Q-T Interval: 426 ms
QRS Duration: 104 ms
QTC Calculation (Bezet): 435 ms
R Axis: -19 degrees
T Axis: 15 degrees
Ventricular Rate: 63 {beats}/min

## 2017-07-15 LAB — GLUCOSE WHOLE BLOOD - POCT
Whole Blood Glucose POCT: 96 mg/dL (ref 70–100)
Whole Blood Glucose POCT: 94 mg/dL (ref 70–100)
Whole Blood Glucose POCT: 101 mg/dL — ABNORMAL HIGH (ref 70–100)
Whole Blood Glucose POCT: 108 mg/dL — ABNORMAL HIGH (ref 70–100)

## 2017-07-15 MED ORDER — BISACODYL 10 MG RE SUPP
10.0000 mg | Freq: Once | RECTAL | Status: AC
Start: 2017-07-15 — End: 2017-07-15
  Administered 2017-07-15: 16:00:00 10 mg via RECTAL
  Filled 2017-07-15: qty 1

## 2017-07-15 MED ORDER — MORPHINE SULFATE 4 MG/ML IJ/IV SOLN (WRAP)
4.0000 mg | Status: AC | PRN
Start: 2017-07-15 — End: 2017-07-18
  Administered 2017-07-15 – 2017-07-16 (×4): 4 mg via INTRAVENOUS
  Filled 2017-07-15 (×4): qty 1

## 2017-07-15 MED ORDER — MORPHINE SULFATE 4 MG/ML IJ/IV SOLN (WRAP)
4.0000 mg | Status: DC | PRN
Start: 2017-07-15 — End: 2017-07-15
  Administered 2017-07-15: 4 mg via INTRAVENOUS
  Filled 2017-07-15: qty 1

## 2017-07-15 NOTE — Progress Notes (Signed)
INTERNAL MEDICINE PROGRESS NOTE    Progress Note - Rod Mae    Date Time: 07/15/17 9:26 AM  [name]      Assessment :   Small bowel obstruction, rule out ischemic bowel.  Leukocytosis.  Abdominal pain secondary to small bowel obstruction.  Diabetes mellitus.  Hypertension      Plan:        He still has normal bowel movement or passed any gas yet .   His abdomen looks very distended .   Patient postop day 2 status post exploratory laparotomy for Small bowel volvulus with small bowel.   Continue NG suction prior surgery.   Clear liquid S tolerated   Pain control   GI prophylaxis   DVT prophylaxis   Discussed plan of care with patient and family.   Discussed plan of care with nurses.   Discussed case with consultants.    Subjective:   Event for the last 24 hours has been reviewed.  Patient had a status post exploratory laparotomy.     Review of Systems:     General ROS: No fatigue, no generalized aches or pains.   HEENT: No eye pain , redness or discharge. No nasal congestion  Respiratory ROS: No cough, SOB.  Cardiovascular ROS: No chest pain, palpitation ,orthopnea or PND  Gastrointestinal ROS: No abdominal pain, diarrhea, nausea or vomiting  Genito-Urinary ROS: No hematuria, dysuria, urgency or frequency.  Musculoskeletal ROS: No myalgia or arthralgia.  Neurological ROS: No dizziness, headache or weakness. No aphasia or slurred speech    Physical Exam:     Vitals:    07/15/17 0817   BP: 183/88   Pulse: 77   Resp: 18   Temp: 97 F (36.1 C)   SpO2: 96%       Intake/Output Summary (Last 24 hours) at 07/15/17 6213  Last data filed at 07/15/17 0700   Gross per 24 hour   Intake             1910 ml   Output              700 ml   Net             1210 ml       General appearance - alert, well appearing, and in no distress  Eyes - pupils equal and reactive, extraocular eye movements intact  Mouth - mucous membranes moist, pharynx normal without lesions  Neck  - supple, no significant adenopathy  Chest - clear to auscultation, no wheezes, rales or rhonchi, symmetric air entry  Heart - normal rate, regular rhythm, normal S1, S2, no murmurs, rubs, clicks or gallops  Abdomen - Distended, hypoactive bowel sounds with ventral surgical incision.no masses or organomegaly  Neurological - alert, oriented, normal speech, no focal deficits.  Extremities - peripheral pulses normal, no pedal edema, no clubbing or cyanosis  Skin - normal coloration and turgor, no rashes, no suspicious skin lesions noted      Meds Reviewed: Yes  Medications:   Medications:   Scheduled Meds: PRN Meds:        enoxaparin 40 mg Subcutaneous Daily   famotidine 20 mg Oral Q12H [facility]   Or      famotidine 20 mg Intravenous Q12H [facility]         Continuous Infusions:     . sodium chloride 100 mL/hr (07/15/17 0122)          acetaminophen 650  mg Q4H PRN   benzocaine 1 spray 4X Daily PRN   benzocaine-menthol 1 lozenge Q1H PRN   hydrALAZINE 10 mg Q6H PRN   naloxone 0.2 mg PRN   ondansetron 4 mg Q8H PRN   Or     ondansetron 4 mg Q8H PRN             Labs:   CHEMISTRY:     Recent Labs  Lab 07/14/17  0417 07/13/17  0304   Glucose 115* 171*   BUN 17.0 13.0   Creatinine 1.1 1.2   Calcium 8.7 11.0*   Sodium 141 142   Potassium 4.0 4.0   Chloride 110 105   CO2 22 24   Albumin  --  4.9   AST (SGOT)  --  39*   ALT  --  29   Bilirubin, Total  --  0.5   Alkaline Phosphatase  --  84           Invalid input(s):  AMYLASE          @LABRCNTIP (TSH:3,FRET3:3,FREET4):3)@    HEMATOLOGY:    Recent Labs  Lab 07/14/17  0417 07/13/17  0304   WBC 6.73 12.85*   Hgb 13.5 15.0   Hematocrit 40.7* 43.8   MCV 94.2 93.2   MCH 31.3 31.9   MCHC 33.2 34.2   Platelets 237 279           Invalid input(s):  APTT  Invalid input(s): ARTERIAL BLOOD GAS    URINALYSIS:    Recent Labs  Lab 07/13/17  0304   Urine Type Clean Catch   Color, UA Amber*   Clarity, UA Clear   Specific Gravity UA >1.030   Urine pH 5.5   Nitrite, UA Negative   Ketones UA Negative    Urobilinogen, UA 0.2   Bilirubin, UA Negative   Blood, UA Negative   RBC, UA 0-2   WBC, UA 0-5       MICROBIOLOGY:  Microbiology Results     None            IRadiology:   Radiological Procedure reviewed.  Ct Abd/ Pelvis With Iv Contrast    Result Date: 07/13/2017  Small bowel volvulus at level terminal ileum. Recommendation: Surgical consult. Adaline Sill, MD 07/13/2017 4:00 AM    Chest Ap Portable    Result Date: 07/13/2017  Eventration left hemidiaphragm. Enteric catheter tip and side-port in stomach. Low lung volumes. Bibasilar atelectasis. Gaseous bowel loops. Adaline Sill, MD 07/13/2017 5:45 AM      Leonia Corona Karenann Cai, MD  07/15/2017  9:26 AM

## 2017-07-15 NOTE — Progress Notes (Signed)
Status post laparotomy for small bowel volvulus with lysis of adhesions and detorsion of small bowel  Progressing well  More distended today, likely with early return of bowel function  Check abdominal x-ray to assess  Hold off liquid diet  Sips and chips only for now until flatus is established  Ambulating well  Adequate pain control

## 2017-07-15 NOTE — Progress Notes (Signed)
Iv hydralazine given

## 2017-07-15 NOTE — Plan of Care (Signed)
Problem: Safety  Goal: Patient will be free from infection during hospitalization  Outcome: Progressing   07/13/17 1811   Goal/Interventions addressed this shift   Free from Infection during hospitalization Assess and monitor for signs and symptoms of infection;Monitor lab/diagnostic results;Monitor all insertion sites (i.e. indwelling lines, tubes, urinary catheters, and drains)       Problem: Pain  Goal: Pain at adequate level as identified by patient  Outcome: Progressing   07/14/17 2128   Goal/Interventions addressed this shift   Pain at adequate level as identified by patient Assess pain on admission, during daily assessment and/or before any "as needed" intervention(s);Reassess pain within 30-60 minutes of any procedure/intervention, per Pain Assessment, Intervention, Reassessment (AIR) Cycle;Offer non-pharmacological pain management interventions;Include patient/patient care companion in decisions related to pain management as needed;Evaluate if patient comfort function goal is met   Assess patient for pain, patient instructed on pain management. CFG achieved, patient encourage to verbalize any discomforts, will continue to monitor.      Problem: Side Effects from Pain Analgesia  Goal: Patient will experience minimal side effects of analgesic therapy  Outcome: Progressing   07/14/17 0030   Goal/Interventions addressed this shift   Patient will experience minimal side effects of analgesic therapy Monitor/assess patient's respiratory status (RR depth, effort, breath sounds);Assess for changes in cognitive function;Prevent/manage side effects per LIP orders (i.e. nausea, vomiting, pruritus, constipation, urinary retention, etc.)       Problem: Altered GI Function  Goal: Fluid and electrolyte balance are achieved/maintained  Outcome: Progressing   07/14/17 0030   Goal/Interventions addressed this shift   Fluid and electrolyte balance are achieved/maintained Monitor intake and output every shift;Monitor/assess lab  values and report abnormal values;Provide adequate hydration;Assess for confusion/personality changes;Assess and reassess fluid and electrolyte status     Goal: Elimination patterns are normal or improving  Outcome: Progressing   07/14/17 2128   Goal/Interventions addressed this shift   Elimination patterns are normal or improving Report abnormal assessment to physician;Anticipate/assist with toileting needs;Assess for normal bowel sounds;Monitor for abdominal discomfort;Monitor for abdominal distension;Assess for signs and symptoms of bleeding. Report signs of bleeding to physician;Assess for flatus;Administer treatments as ordered;Reinforce education on foods that improve and complicate bowel movements and how activity and medications can affect bowel movements;Administer medications to improve bowel evacuation as prescribed;Encourage /perform oral hygiene as appropriate   +Bowel sounds, denies passing flatus, abdomen distended, will continue to monitor.    Problem: Moderate/High Fall Risk Score >5  Goal: Patient will remain free of falls  Outcome: Progressing   07/14/17 0800   OTHER   Moderate Risk (6-13) MOD-Utilize diversion activities;MOD-Re-orient confused patients;MOD-Use of assistive devices-bedside commode if appropriate;MOD-Place bedside commode and assistive devices out of sight when not in use   Call bell within reach, discuss fall prevention plan with patient. Non skid socks on, hourly rounding done. Bed in lowest position, fall bracelet on.

## 2017-07-15 NOTE — Progress Notes (Signed)
Pt is admitted from [facility] - lives at home alone in a 3 level townhome - independent at baseline. No reports of HH, SNF/AR or DME    DCP Home w/ NN anticipated - WC van to [facility]       07/15/17 1549   Patient Type   Within 30 Days of Previous Admission? No   Healthcare Decisions   Interviewed: Patient   Orientation/Decision Making Abilities of Patient Alert and Oriented x3, able to make decisions   Healthcare Agent Appointed No   Prior to admission   Prior level of function Independent with ADLs;Ambulates independently   Type of Residence Private residence   Home Layout One level   Have running water, electricity, heat, etc? Yes   Living Arrangements Alone   How do you get to your MD appointments? self   How do you get your groceries? self   Who fixes your meals? self   Who does your laundry? self   Who picks up your prescriptions? self   Dressing Independent   Grooming Independent   Feeding Independent   Bathing Independent   Toileting Independent   Home Care/Community Services None   Prior SNF admission? (Detail) none   Prior Rehab admission? (Detail) none   Adult Protective Services (APS) involved? No   Discharge Planning   Support Systems Family members   Patient expects to be discharged to: home   Anticipated  plan discussed with: Same as interviewed   Mode of transportation: Private car (family member)   Consults/Providers   PT Evaluation Needed 2   OT Evalulation Needed 2   SLP Evaluation Needed 2   Outcome Palliative Care Screen Screened but did not meet criteria for intervention   Correct PCP listed in Epic? Yes   Important Message from Medicare Notice   Patient received 1st IMM Letter? n/a       Jerry Caras, MSW  Social Work Case Manager 1  [phone]

## 2017-07-16 ENCOUNTER — Inpatient Hospital Stay: Payer: Medicare Other

## 2017-07-16 LAB — BASIC METABOLIC PANEL
Anion Gap: 10 (ref 5.0–15.0)
BUN: 13 mg/dL (ref 9.0–28.0)
CO2: 21 mEq/L — ABNORMAL LOW (ref 22–29)
Calcium: 9 mg/dL (ref 8.5–10.5)
Chloride: 104 mEq/L (ref 100–111)
Creatinine: 0.8 mg/dL (ref 0.7–1.3)
Glucose: 105 mg/dL — ABNORMAL HIGH (ref 70–100)
Potassium: 3.9 mEq/L (ref 3.5–5.1)
Sodium: 135 mEq/L — ABNORMAL LOW (ref 136–145)

## 2017-07-16 LAB — GLUCOSE WHOLE BLOOD - POCT
Whole Blood Glucose POCT: 116 mg/dL — ABNORMAL HIGH (ref 70–100)
Whole Blood Glucose POCT: 108 mg/dL — ABNORMAL HIGH (ref 70–100)
Whole Blood Glucose POCT: 107 mg/dL — ABNORMAL HIGH (ref 70–100)
Whole Blood Glucose POCT: 112 mg/dL — ABNORMAL HIGH (ref 70–100)

## 2017-07-16 LAB — CBC
Absolute NRBC: 0 10*3/uL
Hematocrit: 39.2 % — ABNORMAL LOW (ref 42.0–52.0)
Hgb: 12.8 g/dL — ABNORMAL LOW (ref 13.0–17.0)
MCH: 30.9 pg (ref 28.0–32.0)
MCHC: 32.7 g/dL (ref 32.0–36.0)
MCV: 94.7 fL (ref 80.0–100.0)
MPV: 10.2 fL (ref 9.4–12.3)
Nucleated RBC: 0 /100 WBC (ref 0.0–1.0)
Platelets: 261 10*3/uL (ref 140–400)
RBC: 4.14 10*6/uL — ABNORMAL LOW (ref 4.70–6.00)
RDW: 14 % (ref 12–15)
WBC: 3.91 10*3/uL (ref 3.50–10.80)

## 2017-07-16 LAB — GFR: EGFR: >60

## 2017-07-16 LAB — HEMOLYSIS INDEX: Hemolysis Index: 8 (ref 0–18)

## 2017-07-16 MED ORDER — KCL IN DEXTROSE-NACL 20-5-0.45 MEQ/L-%-% IV SOLN
INTRAVENOUS | Status: DC
Start: 2017-07-16 — End: 2017-07-22
  Administered 2017-07-17 – 2017-07-18 (×2): 1000 mL via INTRAVENOUS

## 2017-07-16 MED ORDER — FLEET ENEMA 7-19 GM/118ML RE ENEM
1.0000 | ENEMA | Freq: Once | RECTAL | Status: AC
Start: 2017-07-16 — End: 2017-07-16
  Administered 2017-07-16: 01:00:00 1 via RECTAL
  Filled 2017-07-16: qty 1

## 2017-07-16 MED ORDER — ALUM & MAG HYDROXIDE-SIMETH 200-200-20 MG/5ML PO SUSP
15.0000 mL | ORAL | Status: DC | PRN
Start: 2017-07-16 — End: 2017-07-22
  Administered 2017-07-20: 05:00:00 15 mL via ORAL
  Filled 2017-07-16: qty 30

## 2017-07-16 MED ORDER — BENZOCAINE-MENTHOL 15-3.6 MG MT LOZG
1.0000 | LOZENGE | OROMUCOSAL | Status: DC | PRN
Start: 2017-07-16 — End: 2017-07-16

## 2017-07-16 MED ORDER — IOHEXOL 350 MG/ML IV SOLN
100.0000 mL | Freq: Once | INTRAVENOUS | Status: AC | PRN
Start: 2017-07-16 — End: 2017-07-16
  Administered 2017-07-16: 17:00:00 100 mL via INTRAVENOUS

## 2017-07-16 NOTE — Progress Notes (Signed)
PROGRESS NOTE    Date Time: 07/16/17 1:30 PM  Patient Name: Neil Frederick, Neil Frederick      Subjective:   66 y/o male s/p exploratory laparotomy with lysis of adhesions POD 3 patient reports only pain with ambulation and mild incisional pain. Not reporting any flatus and reporting abdomen feels distended and causing SOB. Voiding spontaneously and tolerating sips and chips with no nausea and vomiting.    Medications:     Current Facility-Administered Medications   Medication Dose Route Frequency   . enoxaparin  40 mg Subcutaneous Daily   . famotidine  20 mg Oral Q12H [facility]    Or   . famotidine  20 mg Intravenous Q12H [facility]     . sodium chloride 100 mL/hr (07/15/17 0122)     acetaminophen, benzocaine, benzocaine-menthol, hydrALAZINE, morphine, naloxone, ondansetron **OR** ondansetron    Review of Systems:     General ROS: negative for - fatigue, fever or malaise  Psychological ROS: negative for - anxiety, behavioral disorder, disorientation or hallucinations  ENT ROS: negative for - headaches, nasal congestion, sinus pain, sneezing or sore throat  Hematological and Lymphatic ROS: negative for - bleeding problems, blood clots, bruising or swollen lymph nodes  Respiratory ROS: negative for - cough, pleuritic pain or positive for SOB with ambulation and sitting up  Cardiovascular ROS: negative for - chest pain, palpitations or rapid heart rate  Gastrointestinal ROS: positive for - abdominal pain, change in bowel habits and gas/bloating, distention  Genito-Urinary ROS: no dysuria, trouble voiding, or hematuria  Musculoskeletal ROS: negative for - joint pain, joint swelling, muscle pain or muscular weakness  Neurological ROS: negative for - confusion, headaches, memory loss, numbness/tingling or visual changes  Dermatological ROS: negative for pruritus and rash      Physical Exam:     Vitals:    07/16/17 1155   BP: 159/84   Pulse: 71   Resp: 14   Temp: 98.3 F (36.8 C)   SpO2: 96%       Intake and Output Summary (Last 24 hours) at Date  Time    Intake/Output Summary (Last 24 hours) at 07/16/17 1330  Last data filed at 07/16/17 0856   Gross per 24 hour   Intake             1220 ml   Output              970 ml   Net              250 ml       General: awake, alert, oriented x 3; no acute distress.  Cardiovascular: regular rate and rhythm, no murmurs, rubs or gallops  Lungs: clear to auscultation bilaterally, without wheezing, rhonchi, or rales  Abdomen: distended, tender to palpation and hard. No palpable masses and bowel sounds are normoactive. No hepatosplenomegaly. Midline incision is clean, dry and intact  Genitourinary: voiding spontanesouly.  Extremities: no clubbing, cyanosis, or edema  Skin: no lesions, no erythema.    Labs:     Results     Procedure Component Value Units Date/Time    Glucose Whole Blood - POCT [[id number]]  (Abnormal) Collected:  07/16/17 1142     Updated:  07/16/17 1149     POCT - Glucose Whole blood 108 (H) mg/dL     Glucose Whole Blood - POCT [[id number]]  (Abnormal) Collected:  07/16/17 0723     Updated:  07/16/17 0729     POCT - Glucose Whole blood 116 (H) mg/dL  Glucose Whole Blood - POCT [[id number]]  (Abnormal) Collected:  07/15/17 2154     Updated:  07/15/17 2158     POCT - Glucose Whole blood 108 (H) mg/dL     Glucose Whole Blood - POCT [[id number]]  (Abnormal) Collected:  07/15/17 1528     Updated:  07/15/17 1546     POCT - Glucose Whole blood 101 (H) mg/dL           Rads:     Radiology Results (24 Hour)     Procedure Component Value Units Date/Time    XR Chest PA Or AP [[id number]] Collected:  07/16/17 1140    Order Status:  Completed Updated:  07/16/17 1146    Narrative:       HISTORY: SOB/Post Op    TECHNIQUE: Single AP view of the chest.    COMPARISON: 07/13/2017.    FINDINGS: There are streaky bibasilar opacities in keeping with  subsegmental atelectasis; concurrent infiltrates cannot be entirely  excluded. Elevation of the left hemidiaphragm is noted. Lung volumes are  low. No pneumothorax. Stable cardiac and  mediastinal contours. No  pleural effusion demonstrated.      Impression:        Bilateral lower lobe subsegmental atelectasis; concurrent  infiltrates cannot be excluded. Low lung volumes.    Gustavus Messing, MD   07/16/2017 11:42 AM    XR Abdomen AP [[id number]] Collected:  07/16/17 1137    Order Status:  Completed Updated:  07/16/17 1144    Narrative:       INDICATION: Small bowel obstruction.    TECHNIQUE: 3 frontal views of the abdomen are compared to 07/15/2017.    FINDINGS: Again demonstrated are multiple dilated small bowel loops,  with representative dilated small bowel loop in the left hemiabdomen  measuring 5.3 cm in diameter. Surgical staples are again noted at  midline. Bibasilar subsegmental atelectasis present.      Impression:        Persistent dilated small bowel, probably reflects ileus in  the setting of recent surgery. Continued follow-up is recommended.    Gustavus Messing, MD   07/16/2017 11:40 AM    XR Abdomen Portable [[id number]] Collected:  07/15/17 1702    Order Status:  Completed Updated:  07/15/17 1708    Narrative:       HISTORY: Status post recent surgery for SBO    EXAMINATION: Portal supine frontal radiographic view of abdomen at 1639    COMPARISON: CT 07/13/2017    FINDINGS: Interval placement of surgical staples oriented vertically  along the lower abdomen and pelvis indicative of recent surgery. There  are multiple dilated loops of small bowel suggestive of ileus given the  recent surgery. Follow-up is indicated for surveillance. Osseous  structures are intact.      Impression:        Postsurgical changes with suspected generalized small bowel  ileus. Recommend continued surveillance.    Ivin Poot, MD   07/15/2017 5:04 PM          Assessment:     Patient Active Problem List   Diagnosis   . Subarachnoid cyst   . Left hip pain       No problems updated.    POD 3 s/p Exploratory Laparotomy and Lysis of Adhesions    Plan:   Continue sips and chips until positive flatus  Pain management with  morphine PRN  VTE prophylaxis with SCDs and ambulation  Monitor for return of bowel function and  advancement of diet  Monitor for further SOB, may need NG tube to decompress the stomach.  Abdominal xray continues to show ileus.  Discussed plan with patient.  Medical management per attending.       Signed by: Darcey Nora, NP

## 2017-07-16 NOTE — Progress Notes (Signed)
Pt complaining of abdominal distention and discomfort, upon assessment abdomen is tight and distended. Dr. Yisroel Ramming made aware. Order for one time fleet enema. Will complete order and continue to monitor pt.

## 2017-07-16 NOTE — Progress Notes (Signed)
Nov. 6, 2018    Still with abd distention- but started to pass gas- dulcolax given  Ambulating in hallways   BP elevated at times- IV Hydralazine given  On morphine IV for pain  Placed back on NPO  IVFs    DCP: Home with potentially no needs    Jari Favre, BSN, RN, ACM-RN  [phone]

## 2017-07-16 NOTE — Progress Notes (Signed)
INTERNAL MEDICINE PROGRESS NOTE    [name]    Date Time: 07/16/17 2:23 PM  [name]      Assessment :   Small bowel obstruction, rule out ischemic bowel.  Shortness of breath, possibly atelectasis.  Abdominal distention due to ileus.  Nausea.  Constipation  Leukocytosis.  Abdominal pain secondary to small bowel obstruction.  Diabetes mellitus.  Hypertension      Plan:        Patient overnight and this morning was not feeling better.  He is complaining of abdominal distention, feeling nauseated and is not having shortness of breath.  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     He still does not have any bowel movement .   We will talk to surgery.   Chest x-ray, kidney, ureter, and bladder to be done.   He still has normal bowel movement or passed any gas yet .   His abdomen looks very distended .   Pain control   GI prophylaxis   DVT prophylaxis   Discussed plan of care with patient and family.   Discussed plan of care with nurses.   Discussed case with consultants.    Subjective:   Event for the last 24 hours has been reviewed.  Patient had a status post exploratory laparotomy.     Review of Systems:     General ROS: No fatigue, no generalized aches or pains.   HEENT: No eye pain , redness or discharge. No nasal congestion  Respiratory ROS: No cough, SOB.  Cardiovascular ROS: No chest pain, palpitation ,orthopnea or PND  Gastrointestinal ROS: No abdominal pain, diarrhea, nausea or vomiting  Genito-Urinary ROS: No hematuria, dysuria, urgency or frequency.  Musculoskeletal ROS: No myalgia or arthralgia.  Neurological ROS: No dizziness, headache or weakness. No aphasia or slurred speech    Physical Exam:     Vitals:    07/16/17 1155   BP: 159/84   Pulse: 71   Resp: 14   Temp: 98.3 F (36.8 C)   SpO2: 96%       Intake/Output Summary (Last 24 hours) at 07/16/17 1423  Last data filed at 07/16/17 1400    Gross per 24 hour   Intake             1220 ml   Output             1120 ml   Net              100 ml       General appearance - alert, well appearing, and in no distress  Eyes - pupils equal and reactive, extraocular eye movements intact  Mouth - mucous membranes moist, pharynx normal without lesions  Neck - supple, no significant adenopathy  Chest - clear to auscultation, no wheezes, rales or rhonchi, symmetric air entry  Heart - normal rate, regular rhythm, normal S1, S2, no murmurs, rubs, clicks or gallops  Abdomen - Distended, hypoactive bowel sounds with ventral surgical incision.no masses or organomegaly  Neurological - alert, oriented, normal speech, no focal deficits.  Extremities - peripheral pulses normal, no pedal edema, no clubbing or cyanosis  Skin - normal coloration and turgor, no rashes, no suspicious skin lesions noted      Meds Reviewed: Yes  Medications:   Medications:   Scheduled Meds: PRN Meds:        enoxaparin 40  mg Subcutaneous Daily   famotidine 20 mg Oral Q12H [facility]   Or      famotidine 20 mg Intravenous Q12H [facility]         Continuous Infusions:     . dextrose 5 % and 0.45 % NaCl with KCl 20 mEq            acetaminophen 650 mg Q4H PRN   benzocaine 1 spray 4X Daily PRN   benzocaine-menthol 1 lozenge Q1H PRN   hydrALAZINE 10 mg Q6H PRN   morphine 4 mg Q3H PRN   naloxone 0.2 mg PRN   ondansetron 4 mg Q8H PRN   Or     ondansetron 4 mg Q8H PRN             Labs:   CHEMISTRY:     Recent Labs  Lab 07/14/17  0417 07/13/17  0304   Glucose 115* 171*   BUN 17.0 13.0   Creatinine 1.1 1.2   Calcium 8.7 11.0*   Sodium 141 142   Potassium 4.0 4.0   Chloride 110 105   CO2 22 24   Albumin  --  4.9   AST (SGOT)  --  39*   ALT  --  29   Bilirubin, Total  --  0.5   Alkaline Phosphatase  --  84           Invalid input(s):  AMYLASE          @LABRCNTIP (TSH:3,FRET3:3,FREET4):3)@    HEMATOLOGY:    Recent Labs  Lab 07/14/17  0417 07/13/17  0304   WBC 6.73 12.85*   Hgb 13.5 15.0   Hematocrit 40.7* 43.8   MCV 94.2 93.2    MCH 31.3 31.9   MCHC 33.2 34.2   Platelets 237 279           Invalid input(s):  APTT  Invalid input(s): ARTERIAL BLOOD GAS    URINALYSIS:    Recent Labs  Lab 07/13/17  0304   Urine Type Clean Catch   Color, UA Amber*   Clarity, UA Clear   Specific Gravity UA >1.030   Urine pH 5.5   Nitrite, UA Negative   Ketones UA Negative   Urobilinogen, UA 0.2   Bilirubin, UA Negative   Blood, UA Negative   RBC, UA 0-2   WBC, UA 0-5       MICROBIOLOGY:  Microbiology Results     None            IRadiology:   Radiological Procedure reviewed.  Xr Chest Pa Or Ap    Result Date: 07/16/2017   Bilateral lower lobe subsegmental atelectasis; concurrent infiltrates cannot be excluded. Low lung volumes. Gustavus Messing, MD 07/16/2017 11:42 AM    Xr Abdomen Ap    Result Date: 07/16/2017   Persistent dilated small bowel, probably reflects ileus in the setting of recent surgery. Continued follow-up is recommended. Gustavus Messing, MD 07/16/2017 11:40 AM    Ct Abd/ Pelvis With Iv Contrast    Result Date: 07/13/2017  Small bowel volvulus at level terminal ileum. Recommendation: Surgical consult. Adaline Sill, MD 07/13/2017 4:00 AM    Chest Ap Portable    Result Date: 07/13/2017  Eventration left hemidiaphragm. Enteric catheter tip and side-port in stomach. Low lung volumes. Bibasilar atelectasis. Gaseous bowel loops. Adaline Sill, MD 07/13/2017 5:45 AM    Xr Abdomen Portable    Result Date: 07/15/2017   Postsurgical changes with suspected generalized small bowel ileus. Recommend  continued surveillance. Ivin Poot, MD 07/15/2017 5:04 PM      Leonia Corona Karenann Cai, MD  07/16/2017  2:23 PM

## 2017-07-16 NOTE — Plan of Care (Signed)
Problem: Safety  Goal: Patient will be free from injury during hospitalization  Outcome: Progressing   07/14/17 2128   Goal/Interventions addressed this shift   Patient will be free from injury during hospitalization  Provide and maintain safe environment;Include patient/ family/ care giver in decisions related to safety;Provide alternative method of communication if needed (communication boards, writing)       Problem: Pain  Goal: Pain at adequate level as identified by patient  Pain assessed/reassessed, IV morphine administered with relief. Will continue to monitor.   07/14/17 2128   Goal/Interventions addressed this shift   Pain at adequate level as identified by patient Assess pain on admission, during daily assessment and/or before any "as needed" intervention(s);Reassess pain within 30-60 minutes of any procedure/intervention, per Pain Assessment, Intervention, Reassessment (AIR) Cycle;Offer non-pharmacological pain management interventions;Include patient/patient care companion in decisions related to pain management as needed;Evaluate if patient comfort function goal is met       Problem: Altered GI Function  Goal: Fluid and electrolyte balance are achieved/maintained  Outcome: Progressing  POD #3 EXPLORATORY LAPAROTOMY, lysis of adhesions. Midline incision with CDI, gauze/abd/tape drsg, abd distended and tight, pt complained of distention and discomfort, fleet enema given once per order, pt had smear of mucus   07/16/17 0304   Goal/Interventions addressed this shift   Fluid and electrolyte balance are achieved/maintained Monitor intake and output every shift;Monitor/assess lab values and report abnormal values;Assess and reassess fluid and electrolyte status;Observe for cardiac arrhythmias;Monitor for muscle weakness    BM, passed gas once, ambulates in hall way, denies n/v. Will continue to monitor.

## 2017-07-16 NOTE — Progress Notes (Signed)
#  67F Nasogastric tube inserted as ordered via right nostril and 1000cc greenish bile drained . Patient tolerated well.

## 2017-07-16 NOTE — UM Notes (Signed)
INPT CSR 07/16/17    POD #3 EXPLORATORY LAPAROTOMY, lysis of adhesions.     -Midline incision with CDI, gauze/abd/tape drsg,   -abd distended and tight,   -Pt complained of distention and discomfort, fleet enema given once per order, pt had smear of mucus  -pt made NPO    BP 159/84   Pulse 71   Temp 98.3 F (36.8 C) (Oral)   Resp 14   Ht 1.651 m (5\' 5" )   Wt 81.6 kg (180 lb)   SpO2 96%   BMI 29.95 kg/m      XR abdomen:   Persistent dilated small bowel, probably reflects ileus in  the setting of recent surgery. Continued follow-up is recommended.    CXR :  Bilateral lower lobe subsegmental atelectasis; concurrent  infiltrates cannot be excluded. Low lung volumes    Scheduled Meds:  Current Facility-Administered Medications   Medication Dose Route Frequency   . enoxaparin  40 mg Subcutaneous Daily   . famotidine  20 mg Oral Q12H [facility]    Or   . famotidine  20 mg Intravenous Q12H [facility]     Continuous Infusions:  . dextrose 5 % and 0.45 % NaCl with KCl 20 mEq       PRN Meds:.acetaminophen, benzocaine, benzocaine-menthol, hydrALAZINE, morphine, naloxone, ondansetron **OR** ondansetron    Medicine note:  Plan:        Patient overnight and this morning was not feeling better.  He is complaining of abdominal distention, feeling nauseated and is not having shortness of breath.  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     He still does not have any bowel movement .   We will talk to surgery.   Chest x-ray, kidney, ureter, and bladder to be done.   He still has normal bowel movement or passed any gas yet .   His abdomen looks very distended .   Pain control   GI prophylaxis   DVT prophylaxis      Derrel Nip, RN, BSN  ACM  Utilization Review Case Manager   Case Management Department  [email or web address]  T [phone]  F [phone]    Please submit all clinical review requests via fax to [phone]

## 2017-07-16 NOTE — Plan of Care (Signed)
Problem: Safety  Goal: Patient will be free from infection during hospitalization  Outcome: Progressing   07/13/17 1811   Goal/Interventions addressed this shift   Free from Infection during hospitalization Assess and monitor for signs and symptoms of infection;Monitor lab/diagnostic results;Monitor all insertion sites (i.e. indwelling lines, tubes, urinary catheters, and drains)       Problem: Pain  Goal: Pain at adequate level as identified by patient  Outcome: Progressing   07/14/17 2128   Goal/Interventions addressed this shift   Pain at adequate level as identified by patient Assess pain on admission, during daily assessment and/or before any "as needed" intervention(s);Reassess pain within 30-60 minutes of any procedure/intervention, per Pain Assessment, Intervention, Reassessment (AIR) Cycle;Offer non-pharmacological pain management interventions;Include patient/patient care companion in decisions related to pain management as needed;Evaluate if patient comfort function goal is met   Assess patient for pain, patient instructed on pain management. CFG achieved, patient encourage to verbalize any discomforts, will continue to monitor.      Problem: Side Effects from Pain Analgesia  Goal: Patient will experience minimal side effects of analgesic therapy  Outcome: Progressing   07/14/17 0030   Goal/Interventions addressed this shift   Patient will experience minimal side effects of analgesic therapy Monitor/assess patient's respiratory status (RR depth, effort, breath sounds);Assess for changes in cognitive function;Prevent/manage side effects per LIP orders (i.e. nausea, vomiting, pruritus, constipation, urinary retention, etc.)       Problem: Altered GI Function  Goal: Fluid and electrolyte balance are achieved/maintained  Outcome: Progressing   07/16/17 0304   Goal/Interventions addressed this shift   Fluid and electrolyte balance are achieved/maintained Monitor intake and output every shift;Monitor/assess lab  values and report abnormal values;Assess and reassess fluid and electrolyte status;Observe for cardiac arrhythmias;Monitor for muscle weakness     Goal: Elimination patterns are normal or improving  Outcome: Progressing   07/14/17 2128   Goal/Interventions addressed this shift   Elimination patterns are normal or improving Report abnormal assessment to physician;Anticipate/assist with toileting needs;Assess for normal bowel sounds;Monitor for abdominal discomfort;Monitor for abdominal distension;Assess for signs and symptoms of bleeding. Report signs of bleeding to physician;Assess for flatus;Administer treatments as ordered;Reinforce education on foods that improve and complicate bowel movements and how activity and medications can affect bowel movements;Administer medications to improve bowel evacuation as prescribed;Encourage /perform oral hygiene as appropriate   Patient stil with abdominal distension, hypoactive BS, per patient only pass flatus once last noght after taking fleets enema. Abd x-ray and chest x-ray done this am for increase abd distension and c/o of shortness of breath. Pulse OX 98% on Ra, pt placed on 1L NC,  Seen by Medical MD and Ortho NP, will continue to monitor.    Problem: Moderate/High Fall Risk Score >5  Goal: Patient will remain free of falls  Outcome: Progressing   07/14/17 0800   OTHER   Moderate Risk (6-13) MOD-Utilize diversion activities;MOD-Re-orient confused patients;MOD-Use of assistive devices-bedside commode if appropriate;MOD-Place bedside commode and assistive devices out of sight when not in use

## 2017-07-17 ENCOUNTER — Inpatient Hospital Stay: Payer: Medicare Other

## 2017-07-17 LAB — BASIC METABOLIC PANEL
Anion Gap: 9 (ref 5.0–15.0)
BUN: 10 mg/dL (ref 9.0–28.0)
CO2: 24 mEq/L (ref 22–29)
Calcium: 9.3 mg/dL (ref 8.5–10.5)
Chloride: 104 mEq/L (ref 100–111)
Creatinine: 0.9 mg/dL (ref 0.7–1.3)
Glucose: 118 mg/dL — ABNORMAL HIGH (ref 70–100)
Potassium: 3.6 mEq/L (ref 3.5–5.1)
Sodium: 137 mEq/L (ref 136–145)

## 2017-07-17 LAB — GLUCOSE WHOLE BLOOD - POCT
Whole Blood Glucose POCT: 121 mg/dL — ABNORMAL HIGH (ref 70–100)
Whole Blood Glucose POCT: 122 mg/dL — ABNORMAL HIGH (ref 70–100)
Whole Blood Glucose POCT: 114 mg/dL — ABNORMAL HIGH (ref 70–100)
Whole Blood Glucose POCT: 117 mg/dL — ABNORMAL HIGH (ref 70–100)

## 2017-07-17 LAB — GFR: EGFR: >60

## 2017-07-17 LAB — HEMOLYSIS INDEX: Hemolysis Index: 11 (ref 0–18)

## 2017-07-17 NOTE — Plan of Care (Signed)
Problem: Pain  Goal: Pain at adequate level as identified by patient  Outcome: Progressing   07/17/17 2134   Goal/Interventions addressed this shift   Pain at adequate level as identified by patient Identify patient comfort function goal;Assess for risk of opioid induced respiratory depression, including snoring/sleep apnea. Alert healthcare team of risk factors identified.;Assess pain on admission, during daily assessment and/or before any "as needed" intervention(s);Reassess pain within 30-60 minutes of any procedure/intervention, per Pain Assessment, Intervention, Reassessment (AIR) Cycle;Evaluate if patient comfort function goal is met;Offer non-pharmacological pain management interventions;Consult/collaborate with Physical Therapy, Occupational Therapy, and/or Speech Therapy;Include patient/patient care companion in decisions related to pain management as needed;Evaluate patient's satisfaction with pain management progress       Problem: Altered GI Function  Goal: Mobility/Activity is maintained at optimal level for patient  Outcome: Progressing   07/17/17 2134   Goal/Interventions addressed this shift   Mobility/activity is maintained at optimal level for patient Increase mobility as tolerated/progressive mobility;Maintain proper body alignment;Plan activities to conserve energy, plan rest periods;Reposition patient every 2 hours and as needed unless able to reposition self;Assess for changes in respiratory status, level of consciousness and/or development of fatigue;Consult/collaborate with Physical Therapy and/or Occupational Therapy;Perform active/passive ROM;Encourage independent activity per ability       Problem: Moderate/High Fall Risk Score >5  Goal: Patient will remain free of falls  Outcome: Progressing   07/17/17 2100   OTHER   Moderate Risk (6-13) MOD-Initiate Yellow "Fall Risk" magnet communication tool;MOD-(V[facility] Only) Yellow "Fall Risk" signage;MOD-(V[facility] Only) Yellow slippers       Comments:  Tolerating Clear Liquids patient verbalizes passing flatus, NGT to Low continues suction with Moderate amount of Light green liquid output. Voiding good volumes of urine and ambulation in the hallway with steady gait. Remain on Remote Telemetry . SBP above 160. Hydralazine 10mg  IV given and BP monitored. Will continue to monitor GI Function and BP

## 2017-07-17 NOTE — Plan of Care (Signed)
Problem: Pain  Goal: Pain at adequate level as identified by patient  Outcome: Progressing   07/17/17 0349   Goal/Interventions addressed this shift   Pain at adequate level as identified by patient Identify patient comfort function goal;Assess for risk of opioid induced respiratory depression, including snoring/sleep apnea. Alert healthcare team of risk factors identified.;Reassess pain within 30-60 minutes of any procedure/intervention, per Pain Assessment, Intervention, Reassessment (AIR) Cycle;Evaluate if patient comfort function goal is met     Pt reports pain "0-2/10" with comfort function goal of "3/10". Did not administer any medications, pt did not want. Educated pt on availability of PRN meds, pt verbalized understanding and knows to request as needed. Pt able to turn self for comfort. Will continue to monitor and intervene as needed.       Problem: Altered GI Function  Goal: Elimination patterns are normal or improving  Outcome: Progressing   07/17/17 0349   Goal/Interventions addressed this shift   Elimination patterns are normal or improving Assess for normal bowel sounds;Monitor for abdominal distension;Monitor for abdominal discomfort;Administer treatments as ordered;Assess for flatus;Report abnormal assessment to physician     Pt's dressing is OTA, staples along umbilicus CDI, healing well. No drainage. Pt ambulates in hallway independently with no assist. No Dyspnea or lightheadedness, encouraged to use incentive spirometer. Lung sounds are clear and pt reports no SOB. NG tube to right nares, port was flushed due to drainage from port, back to low continuous suction. Total 1600 green bile has been removed and Pt continues to drain more. Abdomin is tight, distended and round, Pt states non-tender just tight. No complaints of nausea, vomiting, gas or bm. Pt states belching. Bowel sounds are hypoactive in 4 quadrants. Will continue to monitor and intervene as needed.             Problem: Moderate/High  Fall Risk Score >5  Goal: Patient will remain free of falls  Outcome: Progressing   07/17/17 0349   OTHER   Moderate Risk (6-13) LOW-Fall Interventions Appropriate for Low Fall Risk     Pt verbalizes understanding of falls risk protocol and calls for assist. No orthostasis or dyspnea. Ambulates independently with no assist. No falls this shift.

## 2017-07-17 NOTE — Progress Notes (Signed)
INTERNAL MEDICINE PROGRESS NOTE    Progress Note - Rod Mae    Date Time: 07/17/17 10:50 AM  [name]      Assessment :     Small bowel obstruction, rule out ischemic bowel.  Shortness of breath, possibly atelectasis.  Abdominal distention due to ileus.  Nausea.  Constipation  Leukocytosis.  Abdominal pain secondary to small bowel obstruction.  Diabetes mellitus.  Hypertension      Plan:        Patient feels much better today after the NG tube was inserted.   He has over 3 L of fluid drained through the NG tube.   He passed gas , but no bowel movement yet.     Chest x-ray, kidney, ureter, and bladder to be done.   Plan is once he tolerated the diet and have a bowel movement he could be discharged.   He still has normal bowel movement or passed any gas yet .   His abdomen looks very distended .   Pain control   GI prophylaxis   DVT prophylaxis   Discussed plan of care with patient and family.   Discussed plan of care with nurses.   Discussed case with consultants.    Subjective:   Event for the last 24 hours has been reviewed.  Patient had a status post exploratory laparotomy. He is feeling better compared to yesterday    Review of Systems:     General ROS: No fatigue, no generalized aches or pains.   HEENT: No eye pain , redness or discharge. No nasal congestion  Respiratory ROS: No cough, SOB.  Cardiovascular ROS: No chest pain, palpitation ,orthopnea or PND  Gastrointestinal ROS: No abdominal pain, diarrhea, nausea or vomiting  Genito-Urinary ROS: No hematuria, dysuria, urgency or frequency.  Musculoskeletal ROS: No myalgia or arthralgia.  Neurological ROS: No dizziness, headache or weakness. No aphasia or slurred speech    Physical Exam:     Vitals:    07/17/17 0726   BP: (!) 134/92   Pulse: 77   Resp: 16   Temp: 97.8 F (36.6 C)   SpO2: 97%       Intake/Output Summary (Last 24 hours) at 07/17/17 1050  Last data filed at 07/17/17 1610   Gross  per 24 hour   Intake              385 ml   Output             4200 ml   Net            -3815 ml       General appearance - alert, well appearing, and in no distress  Eyes - pupils equal and reactive, extraocular eye movements intact  Mouth - mucous membranes moist, pharynx normal without lesions  Neck - supple, no significant adenopathy  Chest - clear to auscultation, no wheezes, rales or rhonchi, symmetric air entry  Heart - normal rate, regular rhythm, normal S1, S2, no murmurs, rubs, clicks or gallops  Abdomen - Distended, hypoactive bowel sounds with ventral surgical incision.no masses or organomegaly  Neurological - alert, oriented, normal speech, no focal deficits.  Extremities - peripheral pulses normal, no pedal edema, no clubbing or cyanosis  Skin - normal coloration and turgor, no rashes, no suspicious skin lesions noted      Meds Reviewed: Yes  Medications:   Medications:   Scheduled Meds: PRN Meds:  enoxaparin 40 mg Subcutaneous Daily   famotidine 20 mg Oral Q12H [facility]   Or      famotidine 20 mg Intravenous Q12H [facility]         Continuous Infusions:     . dextrose 5 % and 0.45 % NaCl with KCl 20 mEq 150 mL/hr at 07/17/17 0948          acetaminophen 650 mg Q4H PRN   alum & mag hydroxide-simethicone 15 mL Q4H PRN   benzocaine 1 spray 4X Daily PRN   benzocaine-menthol 1 lozenge Q1H PRN   hydrALAZINE 10 mg Q6H PRN   morphine 4 mg Q3H PRN   naloxone 0.2 mg PRN   ondansetron 4 mg Q8H PRN   Or     ondansetron 4 mg Q8H PRN             Labs:   CHEMISTRY:     Recent Labs  Lab 07/16/17  1441 07/14/17  0417 07/13/17  0304   Glucose 105* 115* 171*   BUN 13.0 17.0 13.0   Creatinine 0.8 1.1 1.2   Calcium 9.0 8.7 11.0*   Sodium 135* 141 142   Potassium 3.9 4.0 4.0   Chloride 104 110 105   CO2 21* 22 24   Albumin  --   --  4.9   AST (SGOT)  --   --  39*   ALT  --   --  29   Bilirubin, Total  --   --  0.5   Alkaline Phosphatase  --   --  84           Invalid input(s):  AMYLASE           @LABRCNTIP (TSH:3,FRET3:3,FREET4):3)@    HEMATOLOGY:    Recent Labs  Lab 07/16/17  1441 07/14/17  0417 07/13/17  0304   WBC 3.91 6.73 12.85*   Hgb 12.8* 13.5 15.0   Hematocrit 39.2* 40.7* 43.8   MCV 94.7 94.2 93.2   MCH 30.9 31.3 31.9   MCHC 32.7 33.2 34.2   Platelets 261 237 279           Invalid input(s):  APTT  Invalid input(s): ARTERIAL BLOOD GAS    URINALYSIS:    Recent Labs  Lab 07/13/17  0304   Urine Type Clean Catch   Color, UA Amber*   Clarity, UA Clear   Specific Gravity UA >1.030   Urine pH 5.5   Nitrite, UA Negative   Ketones UA Negative   Urobilinogen, UA 0.2   Bilirubin, UA Negative   Blood, UA Negative   RBC, UA 0-2   WBC, UA 0-5       MICROBIOLOGY:  Microbiology Results     None            IRadiology:   Radiological Procedure reviewed.  Xr Chest Pa Or Ap    Result Date: 07/16/2017   Bilateral lower lobe subsegmental atelectasis; concurrent infiltrates cannot be excluded. Low lung volumes. Gustavus Messing, MD 07/16/2017 11:42 AM    Xr Abdomen Ap    Result Date: 07/16/2017   Persistent dilated small bowel, probably reflects ileus in the setting of recent surgery. Continued follow-up is recommended. Gustavus Messing, MD 07/16/2017 11:40 AM    Ct Abdomen Pelvis W Iv/ Wo Po Cont    Result Date: 07/16/2017   Interval surgery with resolution of the mesenteric volvulus whirl sign . Small bowel dilatation patient extending to the right lower quadrant with transition to  collapsed distal small bowel and colon indicative of partial small bowel obstruction. The exact transition point not seen. Ascites now present. No discrete abscess cavity. Heron Nay, MD 07/16/2017 10:35 PM    Ct Abd/ Pelvis With Iv Contrast    Result Date: 07/13/2017  Small bowel volvulus at level terminal ileum. Recommendation: Surgical consult. Adaline Sill, MD 07/13/2017 4:00 AM    Xr Chest Ap Portable    Result Date: 07/16/2017   Interval interval placement of a nasogastric tube which ends below the hemidiaphragm. Distal tip projects in the distal  gastric body. No other acute change. [place]  Teferra, MD 07/16/2017 6:27 PM    Chest Ap Portable    Result Date: 07/13/2017  Eventration left hemidiaphragm. Enteric catheter tip and side-port in stomach. Low lung volumes. Bibasilar atelectasis. Gaseous bowel loops. Adaline Sill, MD 07/13/2017 5:45 AM    Xr Abdomen Portable    Result Date: 07/15/2017   Postsurgical changes with suspected generalized small bowel ileus. Recommend continued surveillance. Ivin Poot, MD 07/15/2017 5:04 PM    Xr Abdomen 2 View With Chest 1 View    Result Date: 07/17/2017   1. Mild bibasilar airspace disease likely atelectasis and small bilateral pleural effusions 2. No change in bowel distention most compatible with a partial distal small bowel obstruction Laurena Slimmer, MD 07/17/2017 9:48 AM      Leonia Corona Karenann Cai, MD  07/17/2017  10:50 AM

## 2017-07-17 NOTE — Progress Notes (Signed)
PROGRESS NOTE    Date Time: 07/17/17 3:43 PM  Patient Name: Neil Frederick, Neil Frederick      Subjective:   Reports abdomen feels less distended after NG placed.  NG tube output over 4 liters since inserted yesterday evening. Passing flatus, no bowel movement. Denies nausea. Voiding spontaneously.  Abdominal xray compatible with a partial distal small bowel obstruction.  Medications:     Current Facility-Administered Medications   Medication Dose Route Frequency   . enoxaparin  40 mg Subcutaneous Daily   . famotidine  20 mg Oral Q12H SCH    Or   . famotidine  20 mg Intravenous Q12H SCH     . dextrose 5 % and 0.45 % NaCl with KCl 20 mEq 150 mL/hr at 07/17/17 0948     acetaminophen, alum & mag hydroxide-simethicone, benzocaine, benzocaine-menthol, hydrALAZINE, morphine, naloxone, ondansetron **OR** ondansetron    Review of Systems:     General ROS: negative for - chills or fever  Psychological ROS: negative for - disorientation  ENT ROS: negative for - headaches  Hematological and Lymphatic ROS: negative for - bleeding problems  Respiratory ROS: no cough, shortness of breath, or wheezing  Cardiovascular ROS: no chest pain or dyspnea on exertion  Gastrointestinal ROS: positive for - gas/bloating  Genito-Urinary ROS: no dysuria, trouble voiding, or hematuria  Musculoskeletal ROS: negative for - joint swelling, muscle pain or muscular weakness  Neurological ROS: no TIA or stroke symptoms  Dermatological ROS: negative for skin lesion changes      Physical Exam:     Vitals:    07/17/17 0726   BP: (!) 134/92   Pulse: 77   Resp: 16   Temp: 97.8 F (36.6 C)   SpO2: 97%       Intake and Output Summary (Last 24 hours) at Date Time    Intake/Output Summary (Last 24 hours) at 07/17/17 1543  Last data filed at 07/17/17 1447   Gross per 24 hour   Intake              385 ml   Output             4350 ml   Net            -3965 ml       General: awake, alert, oriented x 3; no acute distress.  Cardiovascular: regular rate and rhythm, no murmurs, rubs  or gallops  Lungs: clear to auscultation bilaterally, without wheezing, rhonchi, or rales  Abdomen: soft, less-distended; no palpable masses, no hepatosplenomegaly, normoactive bowel sounds, no rebound or guarding  Genitourinary: voiding.  Extremities: no clubbing, cyanosis, or edema  Skin: no lesions, no erythema.    Labs:     Results     Procedure Component Value Units Date/Time    Glucose Whole Blood - POCT [[id number]]  (Abnormal) Collected:  07/17/17 1142     Updated:  07/17/17 1155     POCT - Glucose Whole blood 122 (H) mg/dL     GFR [[id number]] Collected:  07/17/17 1038     Updated:  07/17/17 1105     EGFR >60.0    Basic Metabolic Panel [[id number]]  (Abnormal) Collected:  07/17/17 1038    Specimen:  Blood Updated:  07/17/17 1105     Glucose 118 (H) mg/dL      BUN 08.6 mg/dL      Creatinine 0.9 mg/dL      Calcium 9.3 mg/dL      Sodium 578 mEq/L  Potassium 3.6 mEq/L      Chloride 104 mEq/L      CO2 24 mEq/L      Anion Gap 9.0    Hemolysis index [[id number]] Collected:  07/17/17 1038     Updated:  07/17/17 1105     Hemolysis Index 11    Glucose Whole Blood - POCT [[id number]]  (Abnormal) Collected:  07/17/17 0723     Updated:  07/17/17 0744     POCT - Glucose Whole blood 121 (H) mg/dL     Glucose Whole Blood - POCT [[id number]]  (Abnormal) Collected:  07/16/17 2202     Updated:  07/16/17 2204     POCT - Glucose Whole blood 112 (H) mg/dL     Glucose Whole Blood - POCT [[id number]]  (Abnormal) Collected:  07/16/17 1653     Updated:  07/16/17 1700     POCT - Glucose Whole blood 107 (H) mg/dL           Rads:     Radiology Results (24 Hour)     Procedure Component Value Units Date/Time    XR Abdomen 2 View With Chest 1 View [[id number]] Collected:  07/17/17 0946    Order Status:  Completed Updated:  07/17/17 0952    Narrative:       History: bowel obstruction vs ileus    Technique: Single Portable View    Comparison: 07/16/2017    Findings:  Mild bibasilar airspace disease. Nasogastric tube tip in the stomach.  Small  bilateral pleural effusions.  There is no pneumothorax.  The heart is normal in size.    The mediastinum is within normal limits.    The upright and supine views of the abdomen demonstrate a moderate  amount of air distributed through mildly distended small bowel with  relatively decompressed colon. Contrast seen in the bladder. Nasogastric  tube is seen coiled in the stomach.      Impression:          1. Mild bibasilar airspace disease likely atelectasis and small  bilateral pleural effusions  2. No change in bowel distention most compatible with a partial distal  small bowel obstruction    Laurena Slimmer, MD   07/17/2017 9:48 AM    CT Abdomen Pelvis W IV/ WO PO Cont [[id number]] Collected:  07/16/17 1652    Order Status:  Completed Updated:  07/16/17 2239    Narrative:       CT ABDOMEN PELVIS W IV/ WO PO CONT    CLINICAL INDICATION: Postoperative abdominal distention    COMPARISON: 07/13/2017    TECHNIQUE: Helical CT scan through the abdomen and pelvis was obtained  from the dome of the diaphragm to the symphysis pubis after the  uneventful administration of 100 cc of nonionic intravenous Omnipaque  350 and no oral contrast.   Note that CT scanning at this site  utilizes multiple dose reduction  techniques including automatic exposure control, adjustment of the MAS  and/or KVP according to patient's size and use of iterative  reconstruction technique    FINDINGS: Bibasilar patchy areas of infiltrate versus subsegmental  atelectasis noted. Small right pleural effusion.     The liver, spleen, pancreas adrenal glands  are within normal limits.     The gallbladder demonstrates hypodensity of the lumen presumed to  represent vicarious excretion.    Small bilateral renal cysts and small scars of the right kidney noted  without change. No hydronephrosis    There has been interval surgery  with anterior midline surgical scar. The  previously noted whirl associated with the mesenteric vessels has  resolved. There is persistent  dilatation of the small bowel to the level  of the right lower quadrant with diffuse air-fluid levels. The degree of  dilatation terminates more proximally than the preoperative study and  the overall amount of dilated small bowel has diminished. There  continues to be transition to normal caliber distal small bowel and  relatively collapsed cecum containing a few air bubbles. Small amount of  ascites now present throughout the abdomen and pelvis. There is no  loculated collection to indicate a drainable abscess.     The abdominal aorta and common iliac arteries Are within normal limits  for age. No retroperitoneal mass or adenopathy is present. The lumbar  spine is within normal limits for age.      The urinary bladder is within normal limits. The  prostate and seminal  vesicles within normal limits            Impression:        Interval surgery with resolution of the mesenteric volvulus  whirl sign . Small bowel dilatation patient extending to the right lower  quadrant with transition to collapsed distal small bowel and colon  indicative of partial small bowel obstruction. The exact transition  point not seen. Ascites now present. No discrete abscess cavity.    Heron Nay, MD   07/16/2017 10:35 PM    XR Chest AP Portable [[id number]] Collected:  07/16/17 1821    Order Status:  Completed Updated:  07/16/17 1831    Narrative:       Chest x-ray    CLINICAL INDICATION:   NG Tube placement    COMPARISON: 07/16/2017 10:42 AM    TECHNIQUE: Portable    FINDINGS: There is poor inspiratory effort. There is interval placement  of a nasogastric tube that has below the hemidiaphragm. Tube takes a  hairpin loop in the fundal region of the stomach and has distal tip in  the distal gastric body. Cardiomediastinal silhouette is stable. There  is atelectatic change at the lung bases. There is thickening of the  minor fissure. Appearance is similar to previous examination. No large  pleural effusion or pneumothorax. Degenerative  change of the spine and  shoulder present.       Impression:        Interval interval placement of a nasogastric tube which ends  below the hemidiaphragm. Distal tip projects in the distal gastric body.  No other acute change.    [place]  Teferra, MD   07/16/2017 6:27 PM          Assessment:     Patient Active Problem List   Diagnosis   . Subarachnoid cyst   . Left hip pain           Plan:   Sips and chips.  Await bowel function.  NG tube to low suction.  Encourage to ambulate.  Intravenous hydration.  Follow labs in am, CBC, BMP.  Continue rest of treatment.  Discussed plan with Dr Yisroel Ramming.      Signed by: Darcey Nora, NP

## 2017-07-17 NOTE — Plan of Care (Signed)
Problem: Safety  Goal: Patient will be free from injury during hospitalization   07/17/17 1553   Goal/Interventions addressed this shift   Patient will be free from injury during hospitalization  Assess patient's risk for falls and implement fall prevention plan of care per policy;Provide and maintain safe environment;Ensure appropriate safety devices are available at the bedside;Include patient/ family/ care giver in decisions related to safety;Assess for patients risk for elopement and implement Elopement Risk Plan per policy;Hourly rounding       Problem: Altered GI Function  Goal: Fluid and electrolyte balance are achieved/maintained   07/17/17 1553   Goal/Interventions addressed this shift   Fluid and electrolyte balance are achieved/maintained Monitor intake and output every shift;Monitor/assess lab values and report abnormal values;Provide adequate hydration;Assess for confusion/personality changes;Assess and reassess fluid and electrolyte status;Observe for cardiac arrhythmias     Goal: Mobility/Activity is maintained at optimal level for patient   07/17/17 1553   Goal/Interventions addressed this shift   Mobility/activity is maintained at optimal level for patient Increase mobility as tolerated/progressive mobility;Plan activities to conserve energy, plan rest periods;Reposition patient every 2 hours and as needed unless able to reposition self;Assess for changes in respiratory status, level of consciousness and/or development of fatigue;Maintain proper body alignment;Consult/collaborate with Physical Therapy and/or Occupational Therapy

## 2017-07-18 ENCOUNTER — Inpatient Hospital Stay: Payer: Medicare Other

## 2017-07-18 LAB — GLUCOSE WHOLE BLOOD - POCT
Whole Blood Glucose POCT: 105 mg/dL — ABNORMAL HIGH (ref 70–100)
Whole Blood Glucose POCT: 98 mg/dL (ref 70–100)
Whole Blood Glucose POCT: 91 mg/dL (ref 70–100)
Whole Blood Glucose POCT: 102 mg/dL — ABNORMAL HIGH (ref 70–100)

## 2017-07-18 LAB — BASIC METABOLIC PANEL
Anion Gap: 9 (ref 5.0–15.0)
BUN: 7 mg/dL — ABNORMAL LOW (ref 9.0–28.0)
CO2: 22 mEq/L (ref 22–29)
Calcium: 8.9 mg/dL (ref 8.5–10.5)
Chloride: 107 mEq/L (ref 100–111)
Creatinine: 0.8 mg/dL (ref 0.7–1.3)
Glucose: 101 mg/dL — ABNORMAL HIGH (ref 70–100)
Potassium: 3.9 mEq/L (ref 3.5–5.1)
Sodium: 138 mEq/L (ref 136–145)

## 2017-07-18 LAB — HEMOLYSIS INDEX: Hemolysis Index: 1 (ref 0–18)

## 2017-07-18 LAB — GFR: EGFR: >60

## 2017-07-18 NOTE — Progress Notes (Signed)
Nutritional Support Services  Nutrition Assessment    Neil Frederick 66 y.o. male   MRN: [id number]    Summary of Nutrition Recommendations:   1. Continue diet of sips and chips as medically required. Advance as tolerated/per MD to goal diet: low fiber   2. When diet advanced, provide 1 Ensure Clear PO TID with meals   Each Ensure Clear provides 200 kcal and 7 gm protein.   3. Monitor daily weights - ordered   4. If diet unable to be advanced x upcoming 2-5 days, consider addition of alternative nutrition    Recommendations discussed with RN.  -----------------------------------------------------------------------------------------------------------------                                                      Assessment Data:   Referral Source: Census Review  Reason for Referral: NPO/Clears x 5 Days     Nutrition: RDN met with pt in pt's room. Noted output of >2L x past 24 hrs from NGT and documentation of 2.6L yesterday. NGT clamped at this time. Pt ambulating around the room during time of RDN visit. Pt states appetite is good/normal, denies changes in appetite prior to admission. Reports usual intake of 2 meals per day. States UBW 180#. Reports 10# weight loss 6 months ago, states his PCP attributes weight loss to reduced intake s/p torn ankle ligaments. Pt states weight has remained stable x past 6 months. Current weight 180#. Denies nausea/vomiting/abdominal pain. Pt states he has had 2 BMs this morning.    Learning Needs: Discussed likely plan for diet advancement (clear liquids, low fiber); reviewed diet advancement per MD/NP    Hospital Admission: Pt with SBO, SOB, abdominal distention due to ileus, nausea, constipation, leukocytosis, abdominal pain 2/2 SBO, DM, HTN; NGT removed 11/4 - reinserted 11/7    Medical Hx:  has a past medical history of Arthritis; Diabetes mellitus without complication; Hypercholesteremia; Hypertension; Osteoarthritis; and Psoriasis.    PSH: has a past surgical history that includes  Knee surgery; Knee surgery; and EXPLORATORY LAPAROTOMY, RESECTION SMALL BOWEL (N/A, 07/13/2017).     No orders of the defined types were placed in this encounter.    Intake: no intake documented since admission; pt NPO/sips and chips/Clears x past 5 days    ANTHROPOMETRIC  Anthropometrics  Height: 165.1 cm (5\' 5" )  Weight: 81.6 kg (180 lb)  Weight Change: 0  IBW/kg (Calculated) Male: 61.83 kg  IBW/kg (Calculated) Male: 56.81 kg  BMI (calculated): 30    Weight Monitoring 05/13/2014 07/13/2017   Height 165.1 cm 165.1 cm   Height Method - Stated   Weight 86.728 kg 81.647 kg   Weight Method - Stated   BMI (calculated) 31.9 kg/m2 30 kg/m2     Weight History Summary: weight hx in Epic limited, weight from prior to admission from 2015, not indicative of recent weight hx; will continue to monitor     Physical Assessment:   Head: no overt signs of malnutrition (11/8)  Upper Body: no overt signs of malnutrition (11/8)  Lower Body: no overt signs of malnutrition (11/8)  Edema: none, per RN flowsheet  Skin: not notable, per RN flowsheet  GI function: +BM 11/5, per RN flowsheet    ESTIMATED NEEDS    Total Daily Energy Needs: 1224 to 1632 kcal  Method for Calculating Energy Needs: 15 kcal -  20 kcal per kg  at 81.6 kg (Actual body weight)  Rationale: BMI 30    Total Daily Protein Needs: 97.92 to 122.4 g  Method for Calculating Protein Needs: 1.2 g - 1.5 g per kg at 81.6 kg (Actual body weight)  Rationale: BMI 30    Total Daily Fluid Needs: 1224 to 1632 ml  Method for Calculating Fluid Needs: 1 ml per kcal energy = 1224 to 1632 kcal     Pertinent Medications: Reviewed    IVF:    . dextrose 5 % and 0.45 % NaCl with KCl 20 mEq 1,000 mL (07/18/17 0408)     No Known Allergies    Pertinent labs: Reviewed; Glucose 101-122 x past 24 hrs                                                              Nutrition Diagnosis      Inadequate energy intake related to SBO/ileus as evidenced by pt NPO/Clear Liquids diet x past 5 days                                                                Intervention     Nutrition recommendation -   1. Continue diet of sips and chips as medically required. Advance as tolerated/per MD to goal diet: low fiber   2. When diet advanced, provide 1 Ensure Clear PO TID with meals   Each Ensure Clear provides 200 kcal and 7 gm protein.   3. Monitor daily weights - ordered   4. If diet unable to be advanced x upcoming 2-5 days, consider addition of alternative nutrition     Goal: Diet advancement x 24-48 hrs                                                              Monitoring     Monitor diet advancement, labs, GI function, and weight                                                         Evaluation     Nutrition Risk Level: Moderate (will follow up within 7 days and PRN)    Pending diet advancement - will continue to monitor per policy until diet advanced     Reece Levy, RDN   Ext. [phone]

## 2017-07-18 NOTE — Progress Notes (Signed)
INTERNAL MEDICINE PROGRESS NOTE    Progress Note - Rod Mae    Date Time: 07/18/17 2:21 PM  [name]      Assessment :    S/P Exploratory Laparotomy with lysis of Adhesions 07/13/2017   Hypertension   Benign prostatic hypertrophy    Plan:    Patient had bowel movement 2 today and passing gas   Currently on NG tube suctioning/clamp today and remove if he tolerates   Clear liquid diet   Hydration   Antihypertensive medication as needed   GI and Deep vein thrombosis prophylaxis      Subjective:   Had  bowel movement      Review of Systems:     General: negative for -  fever, chills, malaise  HEENT: negative for - headache, dysphagia, odynophagia   Pulmonary: negative for - cough,  wheezing  Cardiovascular: negative for - pain, dyspnea, orthopnea,   Gastrointestinal: Had BM  Genitourinary: negative for - dysuria, frequency, urgency  Musculoskeletal : negative for - joint pain,  muscle pain   Neurologic : negative for - confusion, dizziness, numbness      Physical Exam:       VITAL SIGNS   Temp:  [97.6 F (36.4 C)-99.2 F (37.3 C)] 97.6 F (36.4 C)  Heart Rate:  [66-83] 77  Resp Rate:  [16-18] 17  BP: (133-192)/(70-99) 175/85  POCT Glucose Result (Read Only)  Avg: 113  Min: 94  Max: 148  SpO2: 97 %    Intake/Output Summary (Last 24 hours) at 07/18/17 1421  Last data filed at 07/18/17 1353   Gross per 24 hour   Intake             6340 ml   Output             3351 ml   Net             2989 ml         General: Awake, alert, in no acute distress.   Heent: pinkish conjunctiva, anicteric sclera, moist mucus membrane   Cvs: S1 & S 2 well heard, regular rate and rhythm   Chest: Clear to auscultation   Abdomen: Soft, non-tender, active bowel sounds.   Gus: Foley not present   Ext : no cyanosis, no edema   CNS: Alert, follows command, no focal deficits    Meds Reviewed: Yes  Medications:     Medications:   Scheduled Meds: PRN Meds:        enoxaparin 40 mg  Subcutaneous Daily   famotidine 20 mg Oral Q12H SCH   Or      famotidine 20 mg Intravenous Q12H SCH         Continuous Infusions:  . dextrose 5 % and 0.45 % NaCl with KCl 20 mEq 1,000 mL (07/18/17 0408)          acetaminophen 650 mg Q4H PRN   alum & mag hydroxide-simethicone 15 mL Q4H PRN   benzocaine 1 spray 4X Daily PRN   benzocaine-menthol 1 lozenge Q1H PRN   hydrALAZINE 10 mg Q6H PRN   morphine 4 mg Q3H PRN   naloxone 0.2 mg PRN   ondansetron 4 mg Q8H PRN   Or     ondansetron 4 mg Q8H PRN           Labs:     CHEMISTRY:     Recent Labs  Lab 07/18/17  0435  07/17/17  1038 07/16/17  1441 07/14/17  0417 07/13/17  0304   Glucose 101* 118* 105* 115* 171*   BUN 7.0* 10.0 13.0 17.0 13.0   Creatinine 0.8 0.9 0.8 1.1 1.2   Calcium 8.9 9.3 9.0 8.7 11.0*   Sodium 138 137 135* 141 142   Potassium 3.9 3.6 3.9 4.0 4.0   Chloride 107 104 104 110 105   CO2 22 24 21* 22 24   Albumin  --   --   --   --  4.9   AST (SGOT)  --   --   --   --  39*   ALT  --   --   --   --  29   Bilirubin, Total  --   --   --   --  0.5   Alkaline Phosphatase  --   --   --   --  84     Invalid input(s):  LIPASE,  AMYLASE          @LABRCNTIP (TSH:3,FRET3:3,FREET4):3)@    HEMATOLOGY:    Recent Labs  Lab 07/16/17  1441 07/14/17  0417 07/13/17  0304   WBC 3.91 6.73 12.85*   Hgb 12.8* 13.5 15.0   Hematocrit 39.2* 40.7* 43.8   MCV 94.7 94.2 93.2   MCH 30.9 31.3 31.9   MCHC 32.7 33.2 34.2   Platelets 261 237 279           Invalid input(s):  APTT  Invalid input(s): ARTERIAL BLOOD GAS    URINALYSIS:    Recent Labs  Lab 07/13/17  0304   Urine Type Clean Catch   Color, UA Amber*   Clarity, UA Clear   Specific Gravity UA >1.030   Urine pH 5.5   Nitrite, UA Negative   Ketones UA Negative   Urobilinogen, UA 0.2   Bilirubin, UA Negative   Blood, UA Negative   RBC, UA 0-2   WBC, UA 0-5       MICROBIOLOGY:  Microbiology Results     None            IRadiology:   Radiological Procedure reviewed.  Xr Chest Pa Or Ap    Result Date: 07/16/2017   Bilateral lower lobe  subsegmental atelectasis; concurrent infiltrates cannot be excluded. Low lung volumes. Gustavus Messing, MD 07/16/2017 11:42 AM    Xr Abdomen Ap    Result Date: 07/18/2017   No significant interval change in small bowel distention. Georgana Curio, MD 07/18/2017 10:59 AM    Xr Abdomen Ap    Result Date: 07/16/2017   Persistent dilated small bowel, probably reflects ileus in the setting of recent surgery. Continued follow-up is recommended. Gustavus Messing, MD 07/16/2017 11:40 AM    Ct Abdomen Pelvis W Iv/ Wo Po Cont    Result Date: 07/16/2017   Interval surgery with resolution of the mesenteric volvulus whirl sign . Small bowel dilatation patient extending to the right lower quadrant with transition to collapsed distal small bowel and colon indicative of partial small bowel obstruction. The exact transition point not seen. Ascites now present. No discrete abscess cavity. Heron Nay, MD 07/16/2017 10:35 PM    Ct Abd/ Pelvis With Iv Contrast    Result Date: 07/13/2017  Small bowel volvulus at level terminal ileum. Recommendation: Surgical consult. Adaline Sill, MD 07/13/2017 4:00 AM    Xr Chest Ap Portable    Result Date: 07/16/2017   Interval interval placement of a nasogastric  tube which ends below the hemidiaphragm. Distal tip projects in the distal gastric body. No other acute change. [place]  Teferra, MD 07/16/2017 6:27 PM    Chest Ap Portable    Result Date: 07/13/2017  Eventration left hemidiaphragm. Enteric catheter tip and side-port in stomach. Low lung volumes. Bibasilar atelectasis. Gaseous bowel loops. Adaline Sill, MD 07/13/2017 5:45 AM    Xr Abdomen Portable    Result Date: 07/15/2017   Postsurgical changes with suspected generalized small bowel ileus. Recommend continued surveillance. Ivin Poot, MD 07/15/2017 5:04 PM    Xr Abdomen 2 View With Chest 1 View    Result Date: 07/17/2017   1. Mild bibasilar airspace disease likely atelectasis and small bilateral pleural effusions 2. No change in bowel distention most  compatible with a partial distal small bowel obstruction Laurena Slimmer, MD 07/17/2017 9:48 AM      Hershal Coria, MD  [facility]  [phone]  07/18/2017  2:21 PM

## 2017-07-18 NOTE — Progress Notes (Signed)
NGT was clamped at 1200 per order

## 2017-07-18 NOTE — Progress Notes (Signed)
NGT was clamped at 1700 per [facility] NP order for 4 hours. Patient had 50ml output

## 2017-07-18 NOTE — Progress Notes (Signed)
NGT was put back on low continuous suctioning per order

## 2017-07-18 NOTE — Plan of Care (Addendum)
Problem: Pain  Goal: Pain at adequate level as identified by patient  Outcome: Progressing   07/18/17 2335   Goal/Interventions addressed this shift   Pain at adequate level as identified by patient Identify patient comfort function goal;Evaluate if patient comfort function goal is met     Denied of pain.    Problem: Altered GI Function  Goal: Elimination patterns are normal or improving  Outcome: Progressing   07/18/17 2335   Goal/Interventions addressed this shift   Elimination patterns are normal or improving Report abnormal assessment to physician;Anticipate/assist with toileting needs;Assess for normal bowel sounds;Monitor for abdominal distension;Monitor for abdominal discomfort     POD #6 EXPLORATORY LAPAROTOMY, lysis of adhesions. A&OX4, afebrile. Has distended abdomen. NG tube to CLWS with small amount of greenish liquid output. Ambulating in the hallway with steady gait. Voiding & has loose BM X1.     Problem: Hemodynamic Status: Cardiac  Goal: Stable vital signs and fluid balance  Outcome: Progressing   07/18/17 2335   Goal/Interventions addressed this shift   Stable vital signs and fluid balance Monitor/assess vital signs and telemetry per unit protocol;Weigh on admission and record weight daily;Assess signs and symptoms associated with cardiac rhythm changes;Monitor intake/output per unit protocol and/or LIP order;Monitor lab values     NSR on the monitor. Pt received IV hydralazine for elevated BP 184/96. Rechecked BP within , BP dropped to 160/87. Will continue to monitor BP.

## 2017-07-18 NOTE — Progress Notes (Addendum)
PROGRESS NOTE    Date Time: 07/18/17 12:01 PM  Patient Name: Neil Frederick, Neil Frederick      Subjective:   Reports he feels better.   NG output 700 cc overnight.  Had two large bowel movements this morning and passing flatus. Xray shows no significant change in small bowel distention.  Voiding spontaneously.    Ambulating in hallways.  Denies any nausea.  Medications:     Current Facility-Administered Medications   Medication Dose Route Frequency   . enoxaparin  40 mg Subcutaneous Daily   . famotidine  20 mg Oral Q12H SCH    Or   . famotidine  20 mg Intravenous Q12H SCH     . dextrose 5 % and 0.45 % NaCl with KCl 20 mEq 1,000 mL (07/18/17 0408)     acetaminophen, alum & mag hydroxide-simethicone, benzocaine, benzocaine-menthol, hydrALAZINE, morphine, naloxone, ondansetron **OR** ondansetron    Review of Systems:     General ROS: negative for - fever  Psychological ROS: negative for - anxiety  ENT ROS: negative for - headaches  Hematological and Lymphatic ROS: negative for - swollen lymph nodes  Respiratory ROS: no cough, shortness of breath, or wheezing  Cardiovascular ROS: no chest pain or dyspnea on exertion  Gastrointestinal ROS: negative for - abdominal pain, nausea or vomiting.  Genito-Urinary ROS: no dysuria, trouble voiding, or hematuria  Musculoskeletal ROS: negative for - joint stiffness, joint swelling or muscle pain  Neurological ROS: no TIA or stroke symptoms  Dermatological ROS: negative for skin lesion changes      Physical Exam:     Vitals:    07/18/17 1140   BP: 164/89   Pulse: 76   Resp: 17   Temp: 97.6 F (36.4 C)   SpO2: 97%       Intake and Output Summary (Last 24 hours) at Date Time    Intake/Output Summary (Last 24 hours) at 07/18/17 1201  Last data filed at 07/18/17 0755   Gross per 24 hour   Intake             5890 ml   Output             3201 ml   Net             2689 ml       General: awake, alert, oriented x 3; no acute distress.  Cardiovascular: regular rate and rhythm, no murmurs, rubs or  gallops  Lungs: clear to auscultation bilaterally, without wheezing, rhonchi, or rales  Abdomen: soft, non-tender, less-distended; no palpable masses, no hepatosplenomegaly, normoactive bowel sounds, no rebound or guarding  Genitourinary: voiding.  Extremities: no clubbing, cyanosis, or edema  Skin: no lesions, no erythema.    Labs:     Results     Procedure Component Value Units Date/Time    Glucose Whole Blood - POCT [[id number]] Collected:  07/18/17 1139     Updated:  07/18/17 1145     POCT - Glucose Whole blood 98 mg/dL     Glucose Whole Blood - POCT [[id number]]  (Abnormal) Collected:  07/18/17 0752     Updated:  07/18/17 0800     POCT - Glucose Whole blood 105 (H) mg/dL     Basic Metabolic Panel [[id number]]  (Abnormal) Collected:  07/18/17 0435    Specimen:  Blood Updated:  07/18/17 0629     Glucose 101 (H) mg/dL      BUN 7.0 (L) mg/dL      Creatinine 0.8 mg/dL  Calcium 8.9 mg/dL      Sodium 706 mEq/L      Potassium 3.9 mEq/L      Chloride 107 mEq/L      CO2 22 mEq/L      Anion Gap 9.0    Hemolysis index [[id number]] Collected:  07/18/17 0435     Updated:  07/18/17 0629     Hemolysis Index 1    GFR [[id number]] Collected:  07/18/17 0435     Updated:  07/18/17 0629     EGFR >60.0    Glucose Whole Blood - POCT [[id number]]  (Abnormal) Collected:  07/17/17 2127     Updated:  07/17/17 2138     POCT - Glucose Whole blood 117 (H) mg/dL     Glucose Whole Blood - POCT [[id number]]  (Abnormal) Collected:  07/17/17 1556     Updated:  07/17/17 1614     POCT - Glucose Whole blood 114 (H) mg/dL           Rads:     Radiology Results (24 Hour)     Procedure Component Value Units Date/Time    XR Abdomen AP [[id number]] Collected:  07/18/17 1058    Order Status:  Completed Updated:  07/18/17 1103    Narrative:       HISTORY:  Abdominal pain.    PRIORS: 07/17/2017.    FINDINGS:   2 views of the abdomen obtained. There is presence of diffuse, small  bowel distention, stable. There is presence of nasogastric tube with tip  in  stomach. There are no finding to suggest pneumothorax. There are  degenerative changes of the lumbar spine.      Impression:        No significant interval change in small bowel distention.     Georgana Curio, MD   07/18/2017 10:59 AM          Assessment:     Patient Active Problem List   Diagnosis   . Subarachnoid cyst   . Left hip pain       S/P Exploratory Laparotomy with lysis of Adhesions.     Plan:   Clear liquid diet.  Clamp NG tube for 4 hours.  Encourage to ambulate in halls.  Medical management per attending.    DVT prophylaxis, Gi prophylaxis.  Continue rest of treatment.  Discussed plan with Dr Yisroel Ramming.    Plan a small bowel follow thru for tomorrow  Signed by: Darcey Nora, NP

## 2017-07-18 NOTE — Progress Notes (Signed)
Patients BP was above parameters set by MD. Hydralazine 10 mg IV given on 2 occasions and BP gradually going down. BP monitored closely patient denies any complaint of chest pain or chest discomfort. Will continue to monitor BP

## 2017-07-18 NOTE — Plan of Care (Signed)
Problem: Safety  Goal: Patient will be free from injury during hospitalization  Outcome: Progressing   07/18/17 1342   Goal/Interventions addressed this shift   Patient will be free from injury during hospitalization  Assess patient's risk for falls and implement fall prevention plan of care per policy;Provide and maintain safe environment;Use appropriate transfer methods;Ensure appropriate safety devices are available at the bedside;Include patient/ family/ care giver in decisions related to safety;Hourly rounding       Problem: Altered GI Function  Goal: Fluid and electrolyte balance are achieved/maintained  Outcome: Progressing   07/18/17 1342   Goal/Interventions addressed this shift   Fluid and electrolyte balance are achieved/maintained Monitor intake and output every shift;Monitor/assess lab values and report abnormal values;Provide adequate hydration;Assess for confusion/personality changes;Monitor daily weight;Assess and reassess fluid and electrolyte status;Monitor for muscle weakness;Observe for cardiac arrhythmias     Goal: Elimination patterns are normal or improving  Outcome: Progressing  Patient had 2 BMs    07/18/17 1342   Goal/Interventions addressed this shift   Elimination patterns are normal or improving Report abnormal assessment to physician;Monitor for abdominal discomfort;Anticipate/assist with toileting needs;Assess for normal bowel sounds;Monitor for abdominal distension;Assess for signs and symptoms of bleeding. Report signs of bleeding to physician;Administer treatments as ordered;Assess for flatus;Reinforce education on foods that improve and complicate bowel movements and how activity and medications can affect bowel movements;Encourage /perform oral hygiene as appropriate

## 2017-07-19 ENCOUNTER — Inpatient Hospital Stay: Payer: Medicare Other | Admitting: Radiology

## 2017-07-19 LAB — GLUCOSE WHOLE BLOOD - POCT
Whole Blood Glucose POCT: 103 mg/dL — ABNORMAL HIGH (ref 70–100)
Whole Blood Glucose POCT: 84 mg/dL (ref 70–100)
Whole Blood Glucose POCT: 96 mg/dL (ref 70–100)
Whole Blood Glucose POCT: 102 mg/dL — ABNORMAL HIGH (ref 70–100)

## 2017-07-19 MED ORDER — DIATRIZOATE MEGLUMINE & SODIUM 66-10 % PO SOLN
210.0000 mL | Freq: Once | ORAL | Status: AC | PRN
Start: 2017-07-19 — End: 2017-07-19
  Administered 2017-07-19: 210 mL via ORAL
  Filled 2017-07-19: qty 210

## 2017-07-19 NOTE — Progress Notes (Signed)
PROGRESS NOTE    Date Time: 07/19/17 5:15 PM  Patient Name: Neil Frederick, Neil Frederick      Subjective:   Reports he feels better.   Having bowel movements and passing flatus.  Small bowel follow through suggest no significant bowel obstruction.  Voiding spontaneously, ambulates in halls.     Medications:     Current Facility-Administered Medications   Medication Dose Route Frequency   . enoxaparin  40 mg Subcutaneous Daily   . famotidine  20 mg Oral Q12H SCH    Or   . famotidine  20 mg Intravenous Q12H SCH     . dextrose 5 % and 0.45 % NaCl with KCl 20 mEq 1,000 mL (07/18/17 0408)     acetaminophen, alum & mag hydroxide-simethicone, benzocaine, benzocaine-menthol, hydrALAZINE, naloxone, ondansetron **OR** ondansetron    Review of Systems:     General ROS: negative for - chills or fever  Psychological ROS: negative for - disorientation  ENT ROS: negative for - headaches  Hematological and Lymphatic ROS: negative for - swollen lymph nodes  Respiratory ROS: no cough, shortness of breath, or wheezing  Cardiovascular ROS: no chest pain or dyspnea on exertion  Gastrointestinal ROS: negative for - gas/bloating  Genito-Urinary ROS: no dysuria, trouble voiding, or hematuria  Musculoskeletal ROS: negative for - joint pain, joint stiffness or joint swelling  Neurological ROS: no TIA or stroke symptoms  Dermatological ROS: negative for skin lesion changes      Physical Exam:     Vitals:    07/19/17 1545   BP: 157/89   Pulse: 81   Resp: 17   Temp: (!) 96 F (35.6 C)   SpO2: 98%       Intake and Output Summary (Last 24 hours) at Date Time    Intake/Output Summary (Last 24 hours) at 07/19/17 1715  Last data filed at 07/19/17 1545   Gross per 24 hour   Intake              595 ml   Output              750 ml   Net             -155 ml       General: awake, alert, oriented x 3; no acute distress.  Cardiovascular: regular rate and rhythm, no murmurs, rubs or gallops  Lungs: clear to auscultation bilaterally, without wheezing, rhonchi, or  rales  Abdomen: soft, less-distended; no palpable masses, no hepatosplenomegaly, normoactive bowel sounds, no rebound or guarding  Genitourinary: voiding.  Extremities: no clubbing, cyanosis, or edema  Skin: no lesions, no erythema.    Labs:     Results     Procedure Component Value Units Date/Time    Glucose Whole Blood - POCT [[id number]] Collected:  07/19/17 1542     Updated:  07/19/17 1640     POCT - Glucose Whole blood 96 mg/dL     Glucose Whole Blood - POCT [[id number]] Collected:  07/19/17 1339     Updated:  07/19/17 1342     POCT - Glucose Whole blood 84 mg/dL     Glucose Whole Blood - POCT [[id number]]  (Abnormal) Collected:  07/19/17 0743     Updated:  07/19/17 0800     POCT - Glucose Whole blood 103 (H) mg/dL     Glucose Whole Blood - POCT [[id number]]  (Abnormal) Collected:  07/18/17 2217     Updated:  07/18/17 2220     POCT - Glucose  Whole blood 102 (H) mg/dL           Rads:     Radiology Results (24 Hour)     Procedure Component Value Units Date/Time    FL Upper GI Sbft [[id number]] Collected:  07/19/17 1301    Order Status:  Completed Updated:  07/19/17 1316    Narrative:       CLINICAL INDICATION: small bowel obstruction. Patient status post recent  bowel surgery.    Limited single contrast upper GI and small bowel follow-through was  performed with approximately 500 cc of 50/50 Gastrografin and water  dilution.    COMPARISON: [facility] 07/18/2017, abdominal pelvic CT 07/16/2017     FINDINGS:     The preliminary radiograph demonstrates diffuse prominent air-filled  small and large bowel loops.    The esophagus and stomach are grossly unremarkable with no ulceration,  mass, or fixed narrowing. No hiatal hernia or reflux.    Oral contrast is at the ascending colon approximately 90 minutes after  the initial contrast intake. There is diffuse prominent dilated small  bowel measuring up to 4 cm was pronounced in the proximal small bowel.  No evidence of bowel stenosis, fistula or extravasation.    Spot compression  views of the terminal ileum appear unremarkable.    Fluoroscopic image(s): 25.    Dose area product: 3167 uGym2.    Fluoroscopic time: 2.5 minute      Impression:           Diffuse prominent small bowel loops likely ileus. Oral contrast at the  ascending colon within 90 minutes of the initial contrast intake  suggests no significant bowel obstruction.      Max Fickle, MD   07/19/2017 1:11 PM          Assessment:     Patient Active Problem List   Diagnosis   . Subarachnoid cyst   . Left hip pain           Plan:   Gray NG tube.  Full liquid diet in am.  Ambulate in hallways.  [place] IV fluids, saline lock.  Discharge planning.  Medical management per attending.   Discussed plan with Dr Yisroel Ramming.    Signed by: Darcey Nora, NP

## 2017-07-19 NOTE — Plan of Care (Signed)
Problem: Safety  Goal: Patient will be free from injury during hospitalization  Outcome: Progressing  Pt  Ambulating  In  Hallway  With  Steady  Gait.  Dr. Yisroel Ramming  In  To see  Pt.   07/19/17 1508   Goal/Interventions addressed this shift   Patient will be free from injury during hospitalization  Assess patient's risk for falls and implement fall prevention plan of care per policy;Use appropriate transfer methods;Provide and maintain safe environment;Ensure appropriate safety devices are available at the bedside;Include patient/ family/ care giver in decisions related to safety;Hourly rounding       Problem: Pain  Goal: Pain at adequate level as identified by patient  Outcome: Progressing  Pt  Denies  Pain  At  This  Time. abd  Soft.  To  Xray  Dept  To  Have  GI  Follow thru. NG  Tube  Removed  By  M.D.   07/19/17 1508   Goal/Interventions addressed this shift   Pain at adequate level as identified by patient Identify patient comfort function goal;Assess for risk of opioid induced respiratory depression, including snoring/sleep apnea. Alert healthcare team of risk factors identified.;Assess pain on admission, during daily assessment and/or before any "as needed" intervention(s);Reassess pain within 30-60 minutes of any procedure/intervention, per Pain Assessment, Intervention, Reassessment (AIR) Cycle       Problem: Altered GI Function  Goal: Elimination patterns are normal or improving  Outcome: Progressing  Pt  Had  Two  Liquid  Stools today. Dark  Green  Color, same  As  NG  Tube   Drain  Color.   07/19/17 1508   Goal/Interventions addressed this shift   Elimination patterns are normal or improving Report abnormal assessment to physician;Anticipate/assist with toileting needs;Monitor for abdominal distension;Monitor for abdominal discomfort;Assess for signs and symptoms of bleeding. Report signs of bleeding to physician;Administer treatments as ordered;Assess for normal bowel sounds

## 2017-07-19 NOTE — Progress Notes (Signed)
INTERNAL MEDICINE PROGRESS NOTE    Progress Note - [facility]    Date Time: 07/19/17 1:38 PM  [name]      Assessment :    S/P Exploratory Laparotomy with lysis of Adhesions 07/13/2017   Hypertension   Benign prostatic hypertrophy    Plan:    Excess NG tube drainage the past 24 hours reaching up to 950 ml   Small bowel follow-through noted with no major obstruction and ileus was noted   Continue NG tube suction   Hydration   Monitor electrolytes and renal function tests    Subjective:     Abdominal discomfort      Review of Systems:     General: negative for -  fever, chills, malaise  HEENT: negative for - headache, dysphagia, odynophagia   Pulmonary: negative for - cough,  wheezing  Cardiovascular: negative for - pain, dyspnea, orthopnea,   Gastrointestinal: Mild distention and discomfort  Genitourinary: negative for - dysuria, frequency, urgency  Musculoskeletal : negative for - joint pain,  muscle pain   Neurologic : negative for - confusion, dizziness, numbness      Physical Exam:       VITAL SIGNS   Temp:  [98.6 F (37 C)-99.8 F (37.7 C)] 98.6 F (37 C)  Heart Rate:  [66-86] 66  Resp Rate:  [17-18] 18  BP: (153-192)/(83-99) 153/88  POCT Glucose Result (Read Only)  Avg: 111.3  Min: 91  Max: 148  SpO2: 98 %    Intake/Output Summary (Last 24 hours) at 07/19/17 1338  Last data filed at 07/19/17 0600   Gross per 24 hour   Intake              745 ml   Output              800 ml   Net              -55 ml         General: Awake, alert, in no acute distress.   Heent: pinkish conjunctiva, anicteric sclera, moist mucus membrane   Cvs: S1 & S 2 well heard, regular rate and rhythm   Chest: Clear to auscultation   Abdomen:Soft/scanty bowel sounds   Gus: Foley not present   Ext : no cyanosis, no edema   CNS: Alert, follows command, no focal deficits    Meds Reviewed: Yes  Medications:     Medications:   Scheduled Meds: PRN Meds:        enoxaparin 40 mg  Subcutaneous Daily   famotidine 20 mg Oral Q12H SCH   Or      famotidine 20 mg Intravenous Q12H SCH         Continuous Infusions:  . dextrose 5 % and 0.45 % NaCl with KCl 20 mEq 1,000 mL (07/18/17 0408)          acetaminophen 650 mg Q4H PRN   alum & mag hydroxide-simethicone 15 mL Q4H PRN   benzocaine 1 spray 4X Daily PRN   benzocaine-menthol 1 lozenge Q1H PRN   hydrALAZINE 10 mg Q6H PRN   naloxone 0.2 mg PRN   ondansetron 4 mg Q8H PRN   Or     ondansetron 4 mg Q8H PRN           Labs:     CHEMISTRY:     Recent Labs  Lab 07/18/17  0435 07/17/17  1038 07/16/17  1441 07/14/17  0981 07/13/17  0304   Glucose 101* 118* 105* 115* 171*   BUN 7.0* 10.0 13.0 17.0 13.0   Creatinine 0.8 0.9 0.8 1.1 1.2   Calcium 8.9 9.3 9.0 8.7 11.0*   Sodium 138 137 135* 141 142   Potassium 3.9 3.6 3.9 4.0 4.0   Chloride 107 104 104 110 105   CO2 22 24 21* 22 24   Albumin  --   --   --   --  4.9   AST (SGOT)  --   --   --   --  39*   ALT  --   --   --   --  29   Bilirubin, Total  --   --   --   --  0.5   Alkaline Phosphatase  --   --   --   --  84     Invalid input(s):  LIPASE,  AMYLASE          @LABRCNTIP (TSH:3,FRET3:3,FREET4):3)@    HEMATOLOGY:    Recent Labs  Lab 07/16/17  1441 07/14/17  0417 07/13/17  0304   WBC 3.91 6.73 12.85*   Hgb 12.8* 13.5 15.0   Hematocrit 39.2* 40.7* 43.8   MCV 94.7 94.2 93.2   MCH 30.9 31.3 31.9   MCHC 32.7 33.2 34.2   Platelets 261 237 279           Invalid input(s):  APTT  Invalid input(s): ARTERIAL BLOOD GAS    URINALYSIS:    Recent Labs  Lab 07/13/17  0304   Urine Type Clean Catch   Color, UA Amber*   Clarity, UA Clear   Specific Gravity UA >1.030   Urine pH 5.5   Nitrite, UA Negative   Ketones UA Negative   Urobilinogen, UA 0.2   Bilirubin, UA Negative   Blood, UA Negative   RBC, UA 0-2   WBC, UA 0-5       MICROBIOLOGY:  Microbiology Results     None            IRadiology:   Radiological Procedure reviewed.  Xr Chest Pa Or Ap    Result Date: 07/16/2017   Bilateral lower lobe subsegmental atelectasis;  concurrent infiltrates cannot be excluded. Low lung volumes. Gustavus Messing, MD 07/16/2017 11:42 AM    Xr Abdomen Ap    Result Date: 07/18/2017   No significant interval change in small bowel distention. Georgana Curio, MD 07/18/2017 10:59 AM    Xr Abdomen Ap    Result Date: 07/16/2017   Persistent dilated small bowel, probably reflects ileus in the setting of recent surgery. Continued follow-up is recommended. Gustavus Messing, MD 07/16/2017 11:40 AM    Ct Abdomen Pelvis W Iv/ Wo Po Cont    Result Date: 07/16/2017   Interval surgery with resolution of the mesenteric volvulus whirl sign . Small bowel dilatation patient extending to the right lower quadrant with transition to collapsed distal small bowel and colon indicative of partial small bowel obstruction. The exact transition point not seen. Ascites now present. No discrete abscess cavity. Heron Nay, MD 07/16/2017 10:35 PM    Ct Abd/ Pelvis With Iv Contrast    Result Date: 07/13/2017  Small bowel volvulus at level terminal ileum. Recommendation: Surgical consult. Adaline Sill, MD 07/13/2017 4:00 AM    Fl Upper Gi Sbft    Result Date: 07/19/2017  Diffuse prominent small bowel loops likely ileus. Oral contrast at the ascending colon within 90  minutes of the initial contrast intake suggests no significant bowel obstruction. Max Fickle, MD 07/19/2017 1:11 PM    Xr Chest Ap Portable    Result Date: 07/16/2017   Interval interval placement of a nasogastric tube which ends below the hemidiaphragm. Distal tip projects in the distal gastric body. No other acute change. [place]  Teferra, MD 07/16/2017 6:27 PM    Chest Ap Portable    Result Date: 07/13/2017  Eventration left hemidiaphragm. Enteric catheter tip and side-port in stomach. Low lung volumes. Bibasilar atelectasis. Gaseous bowel loops. Adaline Sill, MD 07/13/2017 5:45 AM    Xr Abdomen Portable    Result Date: 07/15/2017   Postsurgical changes with suspected generalized small bowel ileus. Recommend continued  surveillance. Ivin Poot, MD 07/15/2017 5:04 PM    Xr Abdomen 2 View With Chest 1 View    Result Date: 07/17/2017   1. Mild bibasilar airspace disease likely atelectasis and small bilateral pleural effusions 2. No change in bowel distention most compatible with a partial distal small bowel obstruction Laurena Slimmer, MD 07/17/2017 9:48 AM      Hershal Coria, MD  [facility]  [phone]  07/19/2017  1:38 PM

## 2017-07-20 LAB — GFR: EGFR: >60

## 2017-07-20 LAB — GLUCOSE WHOLE BLOOD - POCT
Whole Blood Glucose POCT: 97 mg/dL (ref 70–100)
Whole Blood Glucose POCT: 95 mg/dL (ref 70–100)

## 2017-07-20 LAB — BASIC METABOLIC PANEL
Anion Gap: 11 (ref 5.0–15.0)
BUN: 14 mg/dL (ref 9.0–28.0)
CO2: 19 mEq/L — ABNORMAL LOW (ref 22–29)
Calcium: 9.3 mg/dL (ref 8.5–10.5)
Chloride: 108 mEq/L (ref 100–111)
Creatinine: 1.1 mg/dL (ref 0.7–1.3)
Glucose: 95 mg/dL (ref 70–100)
Potassium: 4 mEq/L (ref 3.5–5.1)
Sodium: 138 mEq/L (ref 136–145)

## 2017-07-20 LAB — HEMOLYSIS INDEX: Hemolysis Index: 2 (ref 0–18)

## 2017-07-20 NOTE — Plan of Care (Signed)
Problem: Pain  Goal: Pain at adequate level as identified by patient  Outcome: Progressing   07/20/17 0516   Goal/Interventions addressed this shift   Pain at adequate level as identified by patient Assess for risk of opioid induced respiratory depression, including snoring/sleep apnea. Alert healthcare team of risk factors identified.;Identify patient comfort function goal;Evaluate if patient comfort function goal is met;Reassess pain within 30-60 minutes of any procedure/intervention, per Pain Assessment, Intervention, Reassessment (AIR) Cycle;Assess pain on admission, during daily assessment and/or before any "as needed" intervention(s);Offer non-pharmacological pain management interventions;Evaluate patient's satisfaction with pain management progress     Pt comfort goal 3/10. Pt current pain 0/10. Comfort goal met. Pain regimen effective.     Problem: Altered GI Function  Goal: Elimination patterns are normal or improving  Outcome: Progressing   07/20/17 0516   Goal/Interventions addressed this shift   Elimination patterns are normal or improving Report abnormal assessment to physician;Consult/collaborate with Clinical Nutritionist;Monitor for abdominal distension;Anticipate/assist with toileting needs;Monitor for abdominal discomfort;Assess for normal bowel sounds;Assess for flatus;Assess for signs and symptoms of bleeding. Report signs of bleeding to physician   Abdominal soft but distended, +gas, +bm (loose stools), voiding, hypoactive bowel sounds in all quadrants, -n/v.   Goal: Mobility/Activity is maintained at optimal level for patient  Outcome: Progressing   07/17/17 2134   Goal/Interventions addressed this shift   Mobility/activity is maintained at optimal level for patient Increase mobility as tolerated/progressive mobility;Maintain proper body alignment;Plan activities to conserve energy, plan rest periods;Reposition patient every 2 hours and as needed unless able to reposition self;Assess for  changes in respiratory status, level of consciousness and/or development of fatigue;Consult/collaborate with Physical Therapy and/or Occupational Therapy;Perform active/passive ROM;Encourage independent activity per ability     Educated pt on benefits of ambulation. Pt OOB ad lib walking hallway without assistive devices, no change in LOC, VSS, full ROM.

## 2017-07-20 NOTE — Progress Notes (Signed)
pod7 volvulus  Looks good. Taking fulls. Passing gas  Ambulating  vss afeb  abd a little distended but not tender  He is tolerating fulls can advance as tolerated

## 2017-07-20 NOTE — Plan of Care (Signed)
Problem: Pain  Goal: Pain at adequate level as identified by patient   07/20/17 2023   Goal/Interventions addressed this shift   Pain at adequate level as identified by patient Identify patient comfort function goal;Assess for risk of opioid induced respiratory depression, including snoring/sleep apnea. Alert healthcare team of risk factors identified.;Assess pain on admission, during daily assessment and/or before any "as needed" intervention(s);Include patient/patient care companion in decisions related to pain management as needed;Offer non-pharmacological pain management interventions;Evaluate if patient comfort function goal is met;Evaluate patient's satisfaction with pain management progress   Stated patient's pain scale of 0. Continued to encourage to use IS qh while awake to relax and prevent coughing episodes. Patient ambulated in hallway  X 3 laps     Problem: Altered GI Function  Goal: Nutritional intake is adequate   07/20/17 2023   Goal/Interventions addressed this shift   Nutritional intake is adequate Allow adequate time for meals;Assist patient with meals/food selection   Diabetic diet stressed .Patient's abdomen still distended but stated no pain and able to pass flatus and had BM at 1700.Continued to encourage ambulation to help increase peristalsis-compliant with instructions    Problem: Moderate/High Fall Risk Score >5  Goal: Patient will remain free of falls   07/20/17 1930   OTHER   Moderate Risk (6-13) MOD-Remain with patient during toileting;MOD-Apply bed exit alarm if patient is confused   Bed at lowest position. Call light and phone within patient's reach.Safety and fall precautions discussed with patient -stated understanding    Problem: Hemodynamic Status: Cardiac  Goal: Stable vital signs and fluid balance   07/20/17 2023   Goal/Interventions addressed this shift   Stable vital signs and fluid balance Monitor/assess vital signs and telemetry per unit protocol;Assess signs and symptoms  associated with cardiac rhythm changes;Monitor for leg swelling/edema and report to [facility] if abnormal   Patient is on Remote Telemetry. No edema noted. Stated no chest pain or shortness of breath. Vital signs taken and recorded

## 2017-07-20 NOTE — Plan of Care (Signed)
Problem: Safety  Goal: Patient will be free from injury during hospitalization  Outcome: Progressing   07/20/17 0516   Goal/Interventions addressed this shift   Patient will be free from injury during hospitalization  Assess patient's risk for falls and implement fall prevention plan of care per policy;Provide and maintain safe environment;Include patient/ family/ care giver in decisions related to safety;Ensure appropriate safety devices are available at the bedside;Use appropriate transfer methods;Hourly rounding       Problem: Pain  Goal: Pain at adequate level as identified by patient  Outcome: Progressing   07/20/17 0516   Goal/Interventions addressed this shift   Pain at adequate level as identified by patient Assess for risk of opioid induced respiratory depression, including snoring/sleep apnea. Alert healthcare team of risk factors identified.;Identify patient comfort function goal;Evaluate if patient comfort function goal is met;Reassess pain within 30-60 minutes of any procedure/intervention, per Pain Assessment, Intervention, Reassessment (AIR) Cycle;Assess pain on admission, during daily assessment and/or before any "as needed" intervention(s);Offer non-pharmacological pain management interventions;Evaluate patient's satisfaction with pain management progress       Problem: Altered GI Function  Goal: Elimination patterns are normal or improving  Outcome: Progressing   07/20/17 0516   Goal/Interventions addressed this shift   Elimination patterns are normal or improving Report abnormal assessment to physician;Consult/collaborate with Clinical Nutritionist;Monitor for abdominal distension;Anticipate/assist with toileting needs;Monitor for abdominal discomfort;Assess for normal bowel sounds;Assess for flatus      07/20/17 1610   Goal/Interventions addressed this shift   Elimination patterns are normal or improving Report abnormal assessment to physician;Consult/collaborate with Clinical  Nutritionist;Monitor for abdominal distension;Anticipate/assist with toileting needs;Monitor for abdominal discomfort;Assess for normal bowel sounds;Assess for flatus;Assess for signs and symptoms of bleeding. Report signs of bleeding to physician       Comments: POD#8-EXPLORATORY LAPAROTOMY- patient is alert and oriented to surroundings. Fall precautions are in place with fall prevention plan and fall risk bractlet. Pain is controled with rest. Incision is approximated and CDI. Bowel sounds are hypoactive, pt denies N/v, +flatus, BM's are watery and loose with a red tint, patient states he drank lots of cranberry juice the previous day. Pt is ambulating around in the unit frequently, indigestion is helped with maalox. BP is maintained with rest. Will continue to monitor.

## 2017-07-20 NOTE — Progress Notes (Signed)
INTERNAL MEDICINE PROGRESS NOTE    Progress Note - KEYMANI MCLEAN    Date Time: 07/20/17 2:58 PM  [name]      Assessment :    S/P Exploratory Laparotomy with lysis of Adhesions 07/13/2017   ILeus   Hypertension   Benign prostatic hypertrophy    Plan:    NG tube removed yesterday and is on full liquid diet with no nausea or vomiting, but feels fullness on the stomach   Had bowel movement 6 yesterday and once earlier in the morning, which is loose   Continue hydration   Lab results noted from today    Subjective:    No nausea or vomiting  Past bowel movement today feels        Review of Systems:     General: negative for -  fever, chills, malaise  HEENT: negative for - headache, dysphagia, odynophagia   Pulmonary: negative for - cough,  wheezing  Cardiovascular: negative for - pain, dyspnea, orthopnea,   Gastrointestinal: negative for - pain, nausea , vomiting, diarrhea  Genitourinary: negative for - dysuria, frequency, urgency  Musculoskeletal : negative for - joint pain,  muscle pain   Neurologic : negative for - confusion, dizziness, numbness      Physical Exam:       VITAL SIGNS   Temp:  [96 F (35.6 C)-98.5 F (36.9 C)] 96.3 F (35.7 C)  Heart Rate:  [68-81] 72  Resp Rate:  [17-18] 18  BP: (130-180)/(78-92) 135/82  POCT Glucose Result (Read Only)  Avg: 109.5  Min: 84  Max: 148  SpO2: 96 %    Intake/Output Summary (Last 24 hours) at 07/20/17 1458  Last data filed at 07/20/17 0000   Gross per 24 hour   Intake              300 ml   Output              100 ml   Net              200 ml         General: Awake, alert, in no acute distress.   Heent: pinkish conjunctiva, anicteric sclera, moist mucus membrane   Cvs: S1 & S 2 well heard, regular rate and rhythm   Chest: Clear to auscultation   Abdomen: Nontender with good bowel sound, but full   Gus: Foley not present   Ext : no cyanosis, no edema   CNS: Alert, follows command, no focal  deficits    Meds Reviewed: Yes  Medications:     Medications:   Scheduled Meds: PRN Meds:        enoxaparin 40 mg Subcutaneous Daily   famotidine 20 mg Oral Q12H SCH   Or      famotidine 20 mg Intravenous Q12H SCH         Continuous Infusions:  . dextrose 5 % and 0.45 % NaCl with KCl 20 mEq 1,000 mL (07/18/17 0408)          acetaminophen 650 mg Q4H PRN   alum & mag hydroxide-simethicone 15 mL Q4H PRN   benzocaine 1 spray 4X Daily PRN   benzocaine-menthol 1 lozenge Q1H PRN   hydrALAZINE 10 mg Q6H PRN   naloxone 0.2 mg PRN   ondansetron 4 mg Q8H PRN   Or     ondansetron 4 mg Q8H PRN  Labs:     CHEMISTRY:     Recent Labs  Lab 07/20/17  0452 07/18/17  0435 07/17/17  1038 07/16/17  1441 07/14/17  0417   Glucose 95 101* 118* 105* 115*   BUN 14.0 7.0* 10.0 13.0 17.0   Creatinine 1.1 0.8 0.9 0.8 1.1   Calcium 9.3 8.9 9.3 9.0 8.7   Sodium 138 138 137 135* 141   Potassium 4.0 3.9 3.6 3.9 4.0   Chloride 108 107 104 104 110   CO2 19* 22 24 21* 22     Invalid input(s):  LIPASE,  AMYLASE          @LABRCNTIP (TSH:3,FRET3:3,FREET4):3)@    HEMATOLOGY:    Recent Labs  Lab 07/16/17  1441 07/14/17  0417   WBC 3.91 6.73   Hgb 12.8* 13.5   Hematocrit 39.2* 40.7*   MCV 94.7 94.2   MCH 30.9 31.3   MCHC 32.7 33.2   Platelets 261 237           Invalid input(s):  APTT  Invalid input(s): ARTERIAL BLOOD GAS    URINALYSIS:        Invalid input(s): LEUKOCYTESUR    MICROBIOLOGY:  Microbiology Results     None            IRadiology:   Radiological Procedure reviewed.  Xr Chest Pa Or Ap    Result Date: 07/16/2017   Bilateral lower lobe subsegmental atelectasis; concurrent infiltrates cannot be excluded. Low lung volumes. Neil Messing, MD 07/16/2017 11:42 AM    Xr Abdomen Ap    Result Date: 07/18/2017   No significant interval change in small bowel distention. Georgana Curio, MD 07/18/2017 10:59 AM    Xr Abdomen Ap    Result Date: 07/16/2017   Persistent dilated small bowel, probably reflects ileus in the setting of recent surgery. Continued  follow-up is recommended. Neil Messing, MD 07/16/2017 11:40 AM    Ct Abdomen Pelvis W Iv/ Wo Po Cont    Result Date: 07/16/2017   Interval surgery with resolution of the mesenteric volvulus whirl sign . Small bowel dilatation patient extending to the right lower quadrant with transition to collapsed distal small bowel and colon indicative of partial small bowel obstruction. The exact transition point not seen. Ascites now present. No discrete abscess cavity. Neil Nay, MD 07/16/2017 10:35 PM    Ct Abd/ Pelvis With Iv Contrast    Result Date: 07/13/2017  Small bowel volvulus at level terminal ileum. Recommendation: Surgical consult. Neil Sill, MD 07/13/2017 4:00 AM    Fl Upper Gi Sbft    Result Date: 07/19/2017  Diffuse prominent small bowel loops likely ileus. Oral contrast at the ascending colon within 90 minutes of the initial contrast intake suggests no significant bowel obstruction. Max Fickle, MD 07/19/2017 1:11 PM    Xr Chest Ap Portable    Result Date: 07/16/2017   Interval interval placement of a nasogastric tube which ends below the hemidiaphragm. Distal tip projects in the distal gastric body. No other acute change. Neil  Teferra, MD 07/16/2017 6:27 PM    Chest Ap Portable    Result Date: 07/13/2017  Eventration left hemidiaphragm. Enteric catheter tip and side-port in stomach. Low lung volumes. Bibasilar atelectasis. Gaseous bowel loops. Neil Sill, MD 07/13/2017 5:45 AM    Xr Abdomen Portable    Result Date: 07/15/2017   Postsurgical changes with suspected generalized small bowel ileus. Recommend continued surveillance. Neil Poot, MD 07/15/2017 5:04  PM    Xr Abdomen 2 View With Chest 1 View    Result Date: 07/17/2017   1. Mild bibasilar airspace disease likely atelectasis and small bilateral pleural effusions 2. No change in bowel distention most compatible with a partial distal small bowel obstruction Neil Slimmer, MD 07/17/2017 9:48 AM      Neil Coria, MD  [facility]  clinic  [phone]  07/20/2017  2:58 PM

## 2017-07-20 NOTE — Progress Notes (Signed)
Nutritional Support Services  Nutrition Note    Neil Frederick 66 y.o. male   MRN: [id number]    Nutrition Brief Note    F/U to RDN assessment 11/8.  Pt previously NPO/Clear Liquids x 6 days. Diet advanced this morning to full liquids. RDN to d/c Enusre Clear and add Ensure Enlive for more consistent kcal/protei intake.    Will continue to monitor at moderate nutrition risk and will reassess within 7 days or PRN.    Reece Levy, RDN   Ext. [phone]

## 2017-07-21 LAB — GLUCOSE WHOLE BLOOD - POCT
Whole Blood Glucose POCT: 94 mg/dL (ref 70–100)
Whole Blood Glucose POCT: 104 mg/dL — ABNORMAL HIGH (ref 70–100)

## 2017-07-21 MED ORDER — AMLODIPINE BESYLATE 5 MG PO TABS
10.0000 mg | ORAL_TABLET | Freq: Every day | ORAL | Status: DC
Start: 2017-07-22 — End: 2017-07-22
  Administered 2017-07-22: 08:00:00 10 mg via ORAL
  Filled 2017-07-21: qty 2

## 2017-07-21 NOTE — Progress Notes (Signed)
Pt. ambulating around the unit @ this time. States passing large amount of flatus & had 3 small liquids BMs since early this morning.

## 2017-07-21 NOTE — Plan of Care (Addendum)
Problem: Altered GI Function  Goal: Elimination patterns are normal or improving  Outcome: Progressing  Abdomen round & distended; had BM x 1 this a.m as stated by pt.; tolerating full liquids; no complaints of nausea nor vomiting; no complaints of abdominal pain nor discomfort; ambulated several laps in the hallway this a.m.; midline abdominal incision line OTA w/ staples intact & well approximated; no drainage noted.   07/21/17 0946   Goal/Interventions addressed this shift   Elimination patterns are normal or improving Assess for normal bowel sounds;Monitor for abdominal distension;Monitor for abdominal discomfort;Assess for flatus;Encourage /perform oral hygiene as appropriate

## 2017-07-21 NOTE — Progress Notes (Signed)
tol fulls  abd is softer  Will advance diet  pod8 s/p volvulus surgery, likely can home in am

## 2017-07-21 NOTE — Progress Notes (Signed)
INTERNAL MEDICINE PROGRESS NOTE    [name]    Date Time: 07/21/17 9:24 AM  [name]      Assessment :    S/P Exploratory Laparotomy with lysis of Adhesions 07/13/2017   ILeus   Hypertension   Benign prostatic hypertrophy    Plan:    Tolerated full liquid diet   Has 2 BM so far today but more of a Gas and liquid consistency   Still feels gassy and flatulent   Encouraged activity    Subjective:     Passed BM and tolerated diet      Review of Systems:     General: negative for -  fever, chills, malaise  HEENT: negative for - headache, dysphagia, odynophagia   Pulmonary: negative for - cough,  wheezing  Cardiovascular: negative for - pain, dyspnea, orthopnea,   Gastrointestinal: negative for - pain, nausea , vomiting, diarrhea  Genitourinary: negative for - dysuria, frequency, urgency  Musculoskeletal : negative for - joint pain,  muscle pain   Neurologic : negative for - confusion, dizziness, numbness      Physical Exam:       VITAL SIGNS   Temp:  [96.3 F (35.7 C)-99.5 F (37.5 C)] 98.2 F (36.8 C)  Heart Rate:  [67-72] 67  Resp Rate:  [17-18] 18  BP: (125-151)/(69-88) 125/75  POCT Glucose Result (Read Only)  Avg: 108.1  Min: 84  Max: 148  SpO2: 97 %    Intake/Output Summary (Last 24 hours) at 07/21/17 0924  Last data filed at 07/20/17 2241   Gross per 24 hour   Intake              300 ml   Output                0 ml   Net              300 ml         General: Awake, alert, in no acute distress.   Heent: pinkish conjunctiva, anicteric sclera, moist mucus membrane   Cvs: S1 & S 2 well heard, regular rate and rhythm   Chest: Clear to auscultation   Abdomen: full abdomen with no tenderness   Gus: Foley not present   Ext : no cyanosis, no edema   CNS: Alert, follows command, no focal deficits    Meds Reviewed: Yes  Medications:     Medications:   Scheduled Meds: PRN Meds:        enoxaparin 40 mg Subcutaneous Daily   famotidine 20 mg  Oral Q12H SCH   Or      famotidine 20 mg Intravenous Q12H SCH         Continuous Infusions:  . dextrose 5 % and 0.45 % NaCl with KCl 20 mEq 1,000 mL (07/18/17 0408)          acetaminophen 650 mg Q4H PRN   alum & mag hydroxide-simethicone 15 mL Q4H PRN   benzocaine 1 spray 4X Daily PRN   benzocaine-menthol 1 lozenge Q1H PRN   hydrALAZINE 10 mg Q6H PRN   naloxone 0.2 mg PRN   ondansetron 4 mg Q8H PRN   Or     ondansetron 4 mg Q8H PRN           Labs:     CHEMISTRY:     Recent Labs  Lab 07/20/17  0452 07/18/17  0435 07/17/17  1038 07/16/17  1441   Glucose 95 101* 118* 105*   BUN 14.0 7.0* 10.0 13.0   Creatinine 1.1 0.8 0.9 0.8   Calcium 9.3 8.9 9.3 9.0   Sodium 138 138 137 135*   Potassium 4.0 3.9 3.6 3.9   Chloride 108 107 104 104   CO2 19* 22 24 21*     Invalid input(s):  LIPASE,  AMYLASE          @LABRCNTIP (TSH:3,FRET3:3,FREET4):3)@    HEMATOLOGY:    Recent Labs  Lab 07/16/17  1441   WBC 3.91   Hgb 12.8*   Hematocrit 39.2*   MCV 94.7   MCH 30.9   MCHC 32.7   Platelets 261           Invalid input(s):  APTT  Invalid input(s): ARTERIAL BLOOD GAS    URINALYSIS:        Invalid input(s): LEUKOCYTESUR    MICROBIOLOGY:  Microbiology Results     None            IRadiology:   Radiological Procedure reviewed.  Xr Chest Pa Or Ap    Result Date: 07/16/2017   Bilateral lower lobe subsegmental atelectasis; concurrent infiltrates cannot be excluded. Low lung volumes. Gustavus Messing, MD 07/16/2017 11:42 AM    Xr Abdomen Ap    Result Date: 07/18/2017   No significant interval change in small bowel distention. Georgana Curio, MD 07/18/2017 10:59 AM    Xr Abdomen Ap    Result Date: 07/16/2017   Persistent dilated small bowel, probably reflects ileus in the setting of recent surgery. Continued follow-up is recommended. Gustavus Messing, MD 07/16/2017 11:40 AM    Ct Abdomen Pelvis W Iv/ Wo Po Cont    Result Date: 07/16/2017   Interval surgery with resolution of the mesenteric volvulus whirl sign . Small bowel dilatation patient extending to the right  lower quadrant with transition to collapsed distal small bowel and colon indicative of partial small bowel obstruction. The exact transition point not seen. Ascites now present. No discrete abscess cavity. Heron Nay, MD 07/16/2017 10:35 PM    Ct Abd/ Pelvis With Iv Contrast    Result Date: 07/13/2017  Small bowel volvulus at level terminal ileum. Recommendation: Surgical consult. Adaline Sill, MD 07/13/2017 4:00 AM    Fl Upper Gi Sbft    Result Date: 07/19/2017  Diffuse prominent small bowel loops likely ileus. Oral contrast at the ascending colon within 90 minutes of the initial contrast intake suggests no significant bowel obstruction. Max Fickle, MD 07/19/2017 1:11 PM    Xr Chest Ap Portable    Result Date: 07/16/2017   Interval interval placement of a nasogastric tube which ends below the hemidiaphragm. Distal tip projects in the distal gastric body. No other acute change. [place]  Teferra, MD 07/16/2017 6:27 PM    Chest Ap Portable    Result Date: 07/13/2017  Eventration left hemidiaphragm. Enteric catheter tip and side-port in stomach. Low lung volumes. Bibasilar atelectasis. Gaseous bowel loops. Adaline Sill, MD 07/13/2017 5:45 AM    Xr Abdomen Portable    Result Date: 07/15/2017   Postsurgical changes with suspected generalized small bowel ileus. Recommend continued surveillance. Ivin Poot, MD 07/15/2017 5:04 PM    Xr Abdomen 2 View With Chest 1 View    Result Date: 07/17/2017   1. Mild bibasilar airspace disease likely atelectasis and small bilateral pleural effusions 2. No change in bowel distention most compatible with a partial  distal small bowel obstruction Laurena Slimmer, MD 07/17/2017 9:48 AM      Hershal Coria, MD  [facility]  [phone]  07/21/2017  9:24 AM

## 2017-07-22 LAB — GLUCOSE WHOLE BLOOD - POCT
Whole Blood Glucose POCT: 95 mg/dL (ref 70–100)
Whole Blood Glucose POCT: 98 mg/dL (ref 70–100)

## 2017-07-22 MED ORDER — ALUM & MAG HYDROXIDE-SIMETH 200-200-20 MG/5ML PO SUSP
30.0000 mL | ORAL | Status: DC | PRN
Start: 2017-07-22 — End: 2017-07-22

## 2017-07-22 NOTE — Progress Note - Problem Oriented Charting Notewrit (Signed)
INTERNAL MEDICINE PROGRESS NOTE    [name]    Date Time: 07/22/17 9:11 AM  [name]      Assessment :    S/P Exploratory Laparotomy with lysis of Adhesions 07/13/2017   ILeus   Hypertension   Benign prostatic hypertrophy    Plan:    Soft diet today   Has BM today but LIquid   No nausea or vomiting   Abdomen still full    Subjective:    as above      Review of Systems:     General: negative for -  fever, chills, malaise  HEENT: negative for - headache, dysphagia, odynophagia   Pulmonary: negative for - cough,  wheezing  Cardiovascular: negative for - pain, dyspnea, orthopnea,   Gastrointestinal: negative for - pain, nausea , vomiting, diarrhea  Genitourinary: negative for - dysuria, frequency, urgency  Musculoskeletal : negative for - joint pain,  muscle pain   Neurologic : negative for - confusion, dizziness, numbness      Physical Exam:       VITAL SIGNS   Temp:  [97 F (36.1 C)-99.2 F (37.3 C)] 97 F (36.1 C)  Heart Rate:  [72-78] 78  Resp Rate:  [17-18] 17  BP: (120-168)/(74-92) 120/74  POCT Glucose Result (Read Only)  Avg: 107.6  Min: 84  Max: 148  SpO2: 98 %    Intake/Output Summary (Last 24 hours) at 07/22/17 0911  Last data filed at 07/22/17 5284   Gross per 24 hour   Intake              950 ml   Output                0 ml   Net              950 ml         General: Awake, alert, in no acute distress.   Heent: pinkish conjunctiva, anicteric sclera, moist mucus membrane   Cvs: S1 & S 2 well heard, regular rate and rhythm   Chest: Clear to auscultation   Abdomen: Full abdomen but with good bowel sound   Gus: Foley not present   Ext : no cyanosis, no edema   CNS: Alert, follows command, no focal deficits    Meds Reviewed: Yes  Medications:     Medications:   Scheduled Meds: PRN Meds:        amLODIPine 10 mg Oral Daily   enoxaparin 40 mg Subcutaneous Daily   famotidine 20 mg Oral Q12H SCH   Or      famotidine 20 mg  Intravenous Q12H SCH         Continuous Infusions:  . dextrose 5 % and 0.45 % NaCl with KCl 20 mEq Stopped (07/21/17 1915)          acetaminophen 650 mg Q4H PRN   alum & mag hydroxide-simethicone 15 mL Q4H PRN   benzocaine 1 spray 4X Daily PRN   benzocaine-menthol 1 lozenge Q1H PRN   hydrALAZINE 10 mg Q6H PRN   naloxone 0.2 mg PRN   ondansetron 4 mg Q8H PRN   Or     ondansetron 4 mg Q8H PRN           Labs:     CHEMISTRY:     Recent Labs  Lab 07/20/17  0452 07/18/17  0435 07/17/17  1038 07/16/17  1441  Glucose 95 101* 118* 105*   BUN 14.0 7.0* 10.0 13.0   Creatinine 1.1 0.8 0.9 0.8   Calcium 9.3 8.9 9.3 9.0   Sodium 138 138 137 135*   Potassium 4.0 3.9 3.6 3.9   Chloride 108 107 104 104   CO2 19* 22 24 21*     Invalid input(s):  LIPASE,  AMYLASE          @LABRCNTIP (TSH:3,FRET3:3,FREET4):3)@    HEMATOLOGY:    Recent Labs  Lab 07/16/17  1441   WBC 3.91   Hgb 12.8*   Hematocrit 39.2*   MCV 94.7   MCH 30.9   MCHC 32.7   Platelets 261           Invalid input(s):  APTT  Invalid input(s): ARTERIAL BLOOD GAS    URINALYSIS:        Invalid input(s): LEUKOCYTESUR    MICROBIOLOGY:  Microbiology Results     None            IRadiology:   Radiological Procedure reviewed.  Xr Chest Pa Or Ap    Result Date: 07/16/2017   Bilateral lower lobe subsegmental atelectasis; concurrent infiltrates cannot be excluded. Low lung volumes. Gustavus Messing, MD 07/16/2017 11:42 AM    Xr Abdomen Ap    Result Date: 07/18/2017   No significant interval change in small bowel distention. Georgana Curio, MD 07/18/2017 10:59 AM    Xr Abdomen Ap    Result Date: 07/16/2017   Persistent dilated small bowel, probably reflects ileus in the setting of recent surgery. Continued follow-up is recommended. Gustavus Messing, MD 07/16/2017 11:40 AM    Ct Abdomen Pelvis W Iv/ Wo Po Cont    Result Date: 07/16/2017   Interval surgery with resolution of the mesenteric volvulus whirl sign . Small bowel dilatation patient extending to the right lower quadrant with transition to collapsed  distal small bowel and colon indicative of partial small bowel obstruction. The exact transition point not seen. Ascites now present. No discrete abscess cavity. Heron Nay, MD 07/16/2017 10:35 PM    Ct Abd/ Pelvis With Iv Contrast    Result Date: 07/13/2017  Small bowel volvulus at level terminal ileum. Recommendation: Surgical consult. Adaline Sill, MD 07/13/2017 4:00 AM    Fl Upper Gi Sbft    Result Date: 07/19/2017  Diffuse prominent small bowel loops likely ileus. Oral contrast at the ascending colon within 90 minutes of the initial contrast intake suggests no significant bowel obstruction. Max Fickle, MD 07/19/2017 1:11 PM    Xr Chest Ap Portable    Result Date: 07/16/2017   Interval interval placement of a nasogastric tube which ends below the hemidiaphragm. Distal tip projects in the distal gastric body. No other acute change. [place]  Teferra, MD 07/16/2017 6:27 PM    Chest Ap Portable    Result Date: 07/13/2017  Eventration left hemidiaphragm. Enteric catheter tip and side-port in stomach. Low lung volumes. Bibasilar atelectasis. Gaseous bowel loops. Adaline Sill, MD 07/13/2017 5:45 AM    Xr Abdomen Portable    Result Date: 07/15/2017   Postsurgical changes with suspected generalized small bowel ileus. Recommend continued surveillance. Ivin Poot, MD 07/15/2017 5:04 PM    Xr Abdomen 2 View With Chest 1 View    Result Date: 07/17/2017   1. Mild bibasilar airspace disease likely atelectasis and small bilateral pleural effusions 2. No change in bowel distention most compatible with a partial distal small bowel obstruction Nitin  Lucianne Muss, MD 07/17/2017 9:48 AM      Hershal Coria, MD  [facility] clinic  [phone]  07/22/2017  9:11 AM

## 2017-07-22 NOTE — Discharge Instr - AVS First Page (Addendum)
Esam Omeish, MD  [address]   [address]  [place], [place]  [address]  [phone]  General Postoperative Instructions    ACTIVITY:  Activities of daily living such as walking, going up and down stairs, etc., are not restricted.  However, avoid lifting more than 20 pounds until cleared by your surgeon.     BATHING:  You may remove the outer bandage 48 hours after surgery.  Incisions may be lightly washed with soap and water.  Leave the steri strips (small white bandages) on your skin until they fall off on their own.     CONSTIPATION:  You may take an over the counter stool softener (such as colace) as needed.      DIET: You may resume your normal diet as tolerated.  Some patients opt to eat a bland diet for 24 hours after surgery.   Remember to stay hydrated.      DRIVING:  Do not drive while taking pain medications.      FOLLOW-UP:   Call the office to make an appointment 7-10 days after surgery date.      MEDICATIONS:  You may resume all preoperative medications unless your surgeon tells you otherwise.      NOTIFY SURGEON IF:  you experience any of the following: a fever greater than 101 degrees, persistent nausea and vomiting, unbearable pain, excessive drainage/redness/swelling around surgical areas, or the inability to urinate.      PAIN:  In most cases, you will be given a prescription for pain medications.  Use these medications as directed, and only as needed.  Once the pain begins to decrease, you may switch to over the counter medications such as Tylenol or Advil providing you do not have contraindications to these medications.      If shortness of breath or chest pain occur call 911.

## 2017-07-22 NOTE — Discharge Summary (Signed)
DISCHARGE NOTE    Date Time: 07/22/17 4:00 PM  Patient Name: Neil Frederick, Neil Frederick  Attending Physician: No att. 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 male  's medical history significant for diabetes mellitus, hypertension and osteoarthritis who presents to the hospital with acute onset of abdominal distention and pain which started about 24 hours ago.  He initially noted a bloating on his abdomen diffusely followed by a acute abdominal pain again diffusely.  He had to force a bowel movement yesterday.  He denies any diarrhea, but he felt nauseous but no vomiting.  Came to the emergency department early this morning and was noted to have terminal ileum obstruction.  He has mildly elevated WBC count.    Patient was admitted to surgical floor and evaluated by the Surgeon/ Dr.Omeish. Explorative  laparotomy with lysis of adhesion was done for volvulus on July 13, 2017.  The patient had postoperative ileus but improved progressively.  He was able to hold soft diet without any nausea or vomiting and was passing stool and hence discharged to be followed up as an outpatient.        Laboratory Data     Laboratory Results:     Recent Labs  Lab 07/20/17  0452 07/18/17  0435 07/17/17  1038 07/16/17  1441   Glucose 95 101* 118* 105*   BUN 14.0 7.0* 10.0 13.0   Creatinine 1.1 0.8 0.9 0.8   Calcium 9.3 8.9 9.3 9.0   Sodium 138 138 137 135*   Potassium 4.0 3.9 3.6 3.9   Chloride 108 107 104 104   CO2 19* 22 24 21*      @LABRCNITP (CK:3,TROPI:3,TROPT:3)@  @LABRCNITP (CHOL,TRIG,HDL,LDL)@  @LABRCNITP (TSH,FREET3,FREET4)@      Recent Labs  Lab 07/16/17  1441   WBC 3.91   Hgb 12.8*   Hematocrit 39.2*   MCV 94.7   MCH 30.9   MCHC 32.7   Platelets 261           Invalid input(s):  PTT  @LABRCNTIP (ABG:5)  )  Microbiology Results     None          Urinalysis        Invalid input(s): LEUKOCYTESUR       All radiology result for current encounter  Xr Chest Pa Or Ap    Result Date: 07/16/2017   Bilateral lower lobe subsegmental atelectasis; concurrent infiltrates cannot be excluded. Low lung volumes. Gustavus Messing, MD 07/16/2017 11:42 AM    Xr Abdomen Ap    Result Date: 07/18/2017   No significant interval change in small bowel distention. Georgana Curio, MD 07/18/2017 10:59 AM    Xr Abdomen Ap    Result Date: 07/16/2017   Persistent dilated small bowel, probably reflects ileus in the setting of recent surgery. Continued follow-up is recommended. Gustavus Messing, MD 07/16/2017 11:40 AM    Ct Abdomen Pelvis W Iv/ Wo Po Cont  Result Date: 07/16/2017   Interval surgery with resolution of the mesenteric volvulus whirl sign . Small bowel dilatation patient extending to the right lower quadrant with transition to collapsed distal small bowel and colon indicative of partial small bowel obstruction. The exact transition point not seen. Ascites now present. No discrete abscess cavity. Heron Nay, MD 07/16/2017 10:35 PM    Ct Abd/ Pelvis With Iv Contrast    Result Date: 07/13/2017  Small bowel volvulus at level terminal ileum. Recommendation: Surgical consult. Adaline Sill, MD 07/13/2017 4:00 AM    Fl Upper Gi Sbft    Result Date: 07/19/2017  Diffuse prominent small bowel loops likely ileus. Oral contrast at the ascending colon within 90 minutes of the initial contrast intake suggests no significant bowel obstruction. Max Fickle, MD 07/19/2017 1:11 PM    Xr Chest Ap Portable    Result Date: 07/16/2017   Interval interval placement of a nasogastric tube  which ends below the hemidiaphragm. Distal tip projects in the distal gastric body. No other acute change. [place]  Teferra, MD 07/16/2017 6:27 PM    Chest Ap Portable    Result Date: 07/13/2017  Eventration left hemidiaphragm. Enteric catheter tip and side-port in stomach. Low lung volumes. Bibasilar atelectasis. Gaseous bowel loops. Adaline Sill, MD 07/13/2017 5:45 AM    Xr Abdomen Portable    Result Date: 07/15/2017   Postsurgical changes with suspected generalized small bowel ileus. Recommend continued surveillance. Ivin Poot, MD 07/15/2017 5:04 PM    Xr Abdomen 2 View With Chest 1 View    Result Date: 07/17/2017   1. Mild bibasilar airspace disease likely atelectasis and small bilateral pleural effusions 2. No change in bowel distention most compatible with a partial distal small bowel obstruction Laurena Slimmer, MD 07/17/2017 9:48 AM       Discharge Medications:        Discharge Medication List      Taking    AIRBORNE PO  Take by mouth.     * alfuzosin 10 MG 24 hr tablet  Commonly known as:  UROXATRAL  alfuzosin ER 10 mg tablet,extended release 24 hr     * alfuzosin 10 MG 24 hr tablet  Commonly known as:  UROXATRAL       amLODIPine 10 MG tablet  Commonly known as:  NORVASC       aspirin EC 81 MG EC tablet  Dose:  81 mg  Take 81 mg by mouth daily.     atorvastatin 40 MG tablet  Commonly known as:  LIPITOR       azelastine 0.1 % nasal spray  Dose:  2 spray  Commonly known as:  ASTELIN  2 sprays by Nasal route 2 (two) times daily. Use in each nostril as directed     B-COMPLEX PO  Take by mouth daily.     enalapril 5 MG tablet  Commonly known as:  VASOTEC       EQL OMEGA 3 FISH OIL 1400 MG Caps  Dose:  1400 mg  Take 1,400 mg by mouth.     FLONASE NA  by Nasal route.     JANUVIA 100 MG tablet  Generic drug:  SITagliptin       lansoprazole 30 MG capsule  Dose:  30 mg  Commonly known as:  PREVACID  Take 30 mg by mouth daily.     montelukast 10 MG tablet  Dose:  10 mg  Commonly known as:  SINGULAIR  Take 10 mg by mouth  nightly.     multivitamin tablet  Dose:  1 tablet  Take 1 tablet by mouth daily.     STELARA SC  Inject into the skin.     vitamin C 1000 MG tablet  Dose:  1000 mg  Take 1,000 mg by mouth daily.     Vitamin D3 2000 units Tabs  Take by mouth daily.        * This list has 2 medication(s) that are the same as other medications prescribed for you. Read the directions carefully, and ask your doctor or other care provider to review them with you.            STOP taking these medications    Coenzyme Q10 300 MG Caps     GARCINIA CAMBOGIA-CHROMIUM PO     meloxicam 7.5 MG tablet  Commonly known as:  MOBIC     Naproxen-Esomeprazole 500-20 MG Tbec     TRIPLE FLEX PO                  Physical Examination:     VITAL SIGNS   Temp:  [97 F (36.1 C)-99.2 F (37.3 C)] 98.4 F (36.9 C)  Heart Rate:  [72-78] 78  Resp Rate:  [17-18] 17  BP: (120-168)/(74-92) 128/81  POCT Glucose Result (Read Only)  Avg: 107.3  Min: 84  Max: 148  SpO2: 100 %    Intake/Output Summary (Last 24 hours) at 07/22/17 1600  Last data filed at 07/22/17 6213   Gross per 24 hour   Intake              950 ml   Output                0 ml   Net              950 ml          General appearance : alert, and in no distress  HEENT: pinkish conjunctiva, anicteric sclera, mist mucus membrane.  Neck : supple, no adneopathy  Chest : clear to auscultation  Heart : S1 S2 heard, normal rate and regular rhythm  Abdomen : soft abdomen, non tender, active bowel sounds.  Extremities : no pedal edema  Neurological : Alert, follows command.      Discharge Instructions:      Disposition : Home/ SNF  Activity : as tolerated  Diet :  Follow Up :  Smith Mince, MD  [address]  [place] [address]  [phone]    Call in 1 week      Murow, Buren Kos, MD  [address]  [place] [address]  [phone]                Hershal Coria, MD  [facility] clinic  [phone]  07/22/2017  4:00 PM

## 2017-07-22 NOTE — Plan of Care (Signed)
Problem: Pain  Goal: Pain at adequate level as identified by patient  Outcome: Progressing   07/22/17 0320   Goal/Interventions addressed this shift   Pain at adequate level as identified by patient Identify patient comfort function goal;Assess for risk of opioid induced respiratory depression, including snoring/sleep apnea. Alert healthcare team of risk factors identified.;Reassess pain within 30-60 minutes of any procedure/intervention, per Pain Assessment, Intervention, Reassessment (AIR) Cycle;Evaluate if patient comfort function goal is met     Pt reports pain "0/10" with comfort function goal of "5/10". Did not administer any medication for pain. Educated pt on availability of PRN meds, pt verbalized understanding and knows to request as needed.Cooling unit in place and refilled PRN. Pt able to adjust self for comfort. Will continue to monitor and intervene as needed.       Problem: Altered GI Function  Goal: Elimination patterns are normal or improving  Outcome: Progressing   07/22/17 0320   Goal/Interventions addressed this shift   Elimination patterns are normal or improving Report abnormal assessment to physician;Assess for normal bowel sounds;Monitor for abdominal distension;Monitor for abdominal discomfort;Administer treatments as ordered     Pt ambulated 2000 feet with assist. No Dyspnea or lightheadedness, encouraged to use incentive spirometer. Pt's insision is CDI and OTA with staples. No redness or drainage noted. Abdomin is round and distended. Pt reports not feeling as tight. No complaints of nausea, vomiting, or abd pain. Pt has increased amount of gas and loose water stool. Bowel sounds are active in 4 quadrants. Will continue to monitor and intervene as needed.           Problem: Moderate/High Fall Risk Score >5  Goal: Patient will remain free of falls  Outcome: Progressing   07/22/17 0320   OTHER   Moderate Risk (6-13) LOW-Fall Interventions Appropriate for Low Fall Risk     Pt verbalizes  understanding of falls risk protocol and calls for assist. No orthostasis or dyspnea. Ambulates with no assist. No falls this shift.      Problem: Hemodynamic Status: Cardiac  Goal: Stable vital signs and fluid balance  Outcome: Progressing   07/22/17 0320   Goal/Interventions addressed this shift   Stable vital signs and fluid balance Monitor/assess vital signs and telemetry per unit protocol;Assess signs and symptoms associated with cardiac rhythm changes;Monitor intake/output per unit protocol and/or LIP order     BP was elevated at 168/92, IV hydralazine was given and BP was lowered to 135/90. Other vitals are stable. Pulses are +3 in upper and +2 in lower extremities. No edema or swelling, no complaints of lightheaded, dizziness or headache. Pt on telemetry and heart rhythm is normal sinus. Monitoring I/O's with adequate output. Will continue to monitor and intervene as needed.

## 2017-07-22 NOTE — Progress Notes (Signed)
BP goes up and down has been managed with PRN IV hydralazine. Pt takes Norvasc at home, amlodipine ordered by night admitting sounds physician Dr. Tobie Lords.

## 2017-07-22 NOTE — Progress Notes (Signed)
Feels well and progressing well  VSS afebrile  Passing flatus and had multiple loose bowel movements  Advanced to low fiber diet  Abdomen is soft, non tender, incision is clean and dry  Surgically stable for discharge  Follow up in one week  May shower

## 2017-07-22 NOTE — UM Notes (Signed)
INPT CSR 07/18/17   S/P Exploratory Laparotomy with lysis of Adhesions 07/13/2017    NG output 700 cc overnight  MS 4mg  IV prn overnight x2  Had two large bowel movements this morning and passing flatus.     Prn Hydralazine x3  For BPs 190/93, 192/99, 184/96    XR Abd:  No significant interval change in small bowel distention.     Plan: Surgery    Clear liquid diet.  Clamp NG tube for 4 hours.  Encourage to ambulate in halls  Plan a small bowel follow thru for tomorrow    Medications:   Scheduled Meds: PRN Meds:        enoxaparin 40 mg Subcutaneous Daily   famotidine 20 mg Oral Q12H SCH   Or      famotidine 20 mg Intravenous Q12H SCH         Continuous Infusions:  . dextrose 5 % and 0.45 % NaCl with KCl 20 mEq 1,000 mL (07/18/17 0408)          acetaminophen 650 mg Q4H PRN   alum & mag hydroxide-simethicone 15 mL Q4H PRN   benzocaine 1 spray 4X Daily PRN   benzocaine-menthol 1 lozenge Q1H PRN   hydrALAZINE 10 mg Q6H PRN   morphine 4 mg Q3H PRN   naloxone 0.2 mg PRN   ondansetron 4 mg Q8H PRN   Or     ondansetron 4 mg Q8H PRN             INPT CSR 07/19/17    Abdominal discomfort     Excess NG tube drainage the past 24 hours reaching up to 950 ml   Continue NG tube suction    FL UGI:  Diffuse prominent small bowel loops likely ileus. Oral contrast at the ascending colon within 90 minutes of the initial contrast intake suggests no significant bowel obstruction.    Plan: Surgery   [place] NG tube.  Full liquid diet in am.  Ambulate in hallways.  [place] IV fluids, saline lock.    IV Fluids with KCl cont @ 150/hr    INPT CSR 07/20/17     NG tube removed yesterday and is on full liquid diet with no nausea or vomiting, but feels fullness on the stomach   Had bowel movement 6 yesterday and once earlier in the morning, which is loose   Continue hydration    Surgery:  pod7 volvulus  Looks good. Taking fulls. Passing gas  Ambulating  vss afeb  abd a little distended but not tender  He is tolerating fulls can advance as  tolerated    IV Fluids with Kcl @ 150 cont    INPT CSR 07/21/17     Tolerated full liquid diet   Has 2 BM so far today but more of a Gas and liquid consistency   Still feels gassy and flatulent    Surgery  tol fulls  abd is softer  Will advance diet  pod8 s/p volvulus surgery, likely can home in am    Medications:   Scheduled Meds: PRN Meds:        enoxaparin 40 mg Subcutaneous Daily   famotidine 20 mg Oral Q12H [facility]   Or      famotidine 20 mg Intravenous Q12H SCH         Continuous Infusions:  . dextrose 5 % and 0.45 % NaCl with KCl 20 mEq 1,000 mL (07/18/17 0408)  acetaminophen 650 mg Q4H PRN   alum & mag hydroxide-simethicone 15 mL Q4H PRN   benzocaine 1 spray 4X Daily PRN   benzocaine-menthol 1 lozenge Q1H PRN   hydrALAZINE 10 mg Q6H PRN   naloxone 0.2 mg PRN   ondansetron 4 mg Q8H PRN   Or     ondansetron 4 mg Q8H PRN         2022: BP was elevated at 168/92, IV hydralazine was given and BP was lowered to 135/90    INPT CSR 07/22/17     Soft diet today   Has BM today but LIquid   No nausea or vomiting  Abdomen still full    BP 120/74   Pulse 78   Temp 97 F (36.1 C) (Oral)   Resp 17   Ht 1.651 m (5\' 5" )   Wt 81.6 kg (180 lb)   SpO2 98%   BMI 29.95 kg/m     Scheduled Meds:  Current Facility-Administered Medications   Medication Dose Route Frequency   . amLODIPine  10 mg Oral Daily   . enoxaparin  40 mg Subcutaneous Daily   . famotidine  20 mg Oral Q12H SCH    Or   . famotidine  20 mg Intravenous Q12H SCH     Continuous Infusions:  . dextrose 5 % and 0.45 % NaCl with KCl 20 mEq Stopped (07/21/17 1915)     PRN Meds:.acetaminophen, alum & mag hydroxide-simethicone, alum & mag hydroxide-simethicone, benzocaine, benzocaine-menthol, hydrALAZINE, naloxone, ondansetron **OR** ondansetron    Awaiting Surgery rounding for today     Derrel Nip, RN, BSN  ACM  Utilization Review Case Manager   Case Management Department  [email or web address] [phone]  F  [phone]    Please submit all clinical review requests via fax to [phone]

## 2017-07-22 NOTE — Plan of Care (Signed)
Problem: Safety  Goal: Patient will be free from injury during hospitalization  Outcome: Progressing  Pt  Ambulating  In  Hallway. Pt  Has  Steady    07/22/17 1238   Goal/Interventions addressed this shift   Patient will be free from injury during hospitalization  Assess patient's risk for falls and implement fall prevention plan of care per policy;Provide and maintain safe environment;Use appropriate transfer methods;Ensure appropriate safety devices are available at the bedside;Include patient/ family/ care giver in decisions related to safety;Hourly rounding     Gait.     Problem: Pain  Goal: Pain at adequate level as identified by patient  Outcome: Progressing  Pt  Denies  Pain .  Pt  Does  Not  Take  Any  Pain  meds at  All while  Here  In hospital.   07/22/17 1238   Goal/Interventions addressed this shift   Pain at adequate level as identified by patient Identify patient comfort function goal;Assess pain on admission, during daily assessment and/or before any "as needed" intervention(s);Assess for risk of opioid induced respiratory depression, including snoring/sleep apnea. Alert healthcare team of risk factors identified.       Problem: Altered GI Function  Goal: Elimination patterns are normal or improving  Outcome: Progressing  Pt  Having   Liquid  Stools.  Brown  Liquid  Noted. Pt  Has  Two  To three    07/22/17 1238   Goal/Interventions addressed this shift   Elimination patterns are normal or improving Report abnormal assessment to physician;Anticipate/assist with toileting needs;Monitor for abdominal discomfort;Monitor for abdominal distension;Assess for signs and symptoms of bleeding. Report signs of bleeding to physician;Assess for normal bowel sounds    Stools  Per  Day for   Past  3  Days.

## 2017-08-23 NOTE — PT/OT Therapy Note (Signed)
Discontinuation of Therapy Services    Neil Frederick did not complete prescribed physical therapy visits.      Status is unknown at this time, physical therapy has been discontinued and patient has been discharged from care.      [name], PT, DPT [phone]        08/23/2017

## 2018-05-27 ENCOUNTER — Encounter: Payer: Self-pay | Admitting: Internal Medicine

## 2018-06-08 ENCOUNTER — Emergency Department (HOSPITAL_COMMUNITY): Payer: Medicare Other

## 2018-06-08 ENCOUNTER — Other Ambulatory Visit: Payer: Self-pay

## 2018-06-08 ENCOUNTER — Encounter (HOSPITAL_COMMUNITY): Payer: Self-pay | Admitting: *Deleted

## 2018-06-08 ENCOUNTER — Inpatient Hospital Stay (HOSPITAL_COMMUNITY)
Admission: EM | Admit: 2018-06-08 | Discharge: 2018-06-14 | DRG: 390 | Disposition: A | Discharge status: Home / Self Care | Payer: Medicare Other | Attending: General Surgery | Admitting: General Surgery

## 2018-06-08 DIAGNOSIS — K56609 Unspecified intestinal obstruction, unspecified as to partial versus complete obstruction: Secondary | ICD-10-CM

## 2018-06-08 DIAGNOSIS — K219 Gastro-esophageal reflux disease without esophagitis: Secondary | ICD-10-CM | POA: Diagnosis present

## 2018-06-08 DIAGNOSIS — K5651 Intestinal adhesions [bands], with partial obstruction: Principal | ICD-10-CM | POA: Diagnosis present

## 2018-06-08 DIAGNOSIS — I10 Essential (primary) hypertension: Secondary | ICD-10-CM | POA: Diagnosis present

## 2018-06-08 DIAGNOSIS — E119 Type 2 diabetes mellitus without complications: Secondary | ICD-10-CM | POA: Diagnosis present

## 2018-06-08 DIAGNOSIS — R109 Unspecified abdominal pain: Secondary | ICD-10-CM | POA: Diagnosis not present

## 2018-06-08 DIAGNOSIS — E785 Hyperlipidemia, unspecified: Secondary | ICD-10-CM | POA: Diagnosis present

## 2018-06-08 DIAGNOSIS — Z978 Presence of other specified devices: Secondary | ICD-10-CM

## 2018-06-08 HISTORY — DX: Gastro-esophageal reflux disease without esophagitis: K21.9

## 2018-06-08 HISTORY — DX: Essential (primary) hypertension: I10

## 2018-06-08 HISTORY — DX: Type 2 diabetes mellitus without complications: E11.9

## 2018-06-08 HISTORY — DX: Pure hypercholesterolemia, unspecified: E78.00

## 2018-06-08 MED ORDER — ONDANSETRON HCL 4 MG/2ML IJ SOLN
4.0000 mg | Freq: Once | INTRAMUSCULAR | Status: AC
  Administered 2018-06-08: 4 mg via INTRAVENOUS
  Filled 2018-06-08: qty 2

## 2018-06-08 MED ORDER — MORPHINE SULFATE (PF) 4 MG/ML IV SOLN: 4.0000 mg | Freq: Once | INTRAVENOUS | Status: DC

## 2018-06-08 MED ORDER — SODIUM CHLORIDE 0.9 % IV BOLUS
1000.0000 mL | Freq: Once | INTRAVENOUS | Status: AC
  Administered 2018-06-08: 1000 mL via INTRAVENOUS

## 2018-06-08 MED ORDER — MORPHINE SULFATE (PF) 2 MG/ML IV SOLN
INTRAVENOUS | Status: AC
  Administered 2018-06-08: 4 mg via INTRAVENOUS
  Filled 2018-06-08: qty 2

## 2018-06-08 NOTE — ED Triage Notes (Signed)
Pt c/o abd pain with n/v, denies any diarrhea, fever, last bowel movement was a hour ago and normal,

## 2018-06-08 NOTE — ED Provider Notes (Signed)
[facility] EMERGENCY DEPARTMENT Provider Note   CSN: [id number] Arrival date & time: 06/08/18  2319     History   Chief Complaint Chief Complaint  Patient presents with  . Abdominal Pain    HPI [name] Boyle is a 67 y.o. male.  Patient is a 67 year old male with history of diabetes, hypertension, GERD, and hyperlipidemia.  He presents today for evaluation of abdominal pain.  Earlier today, he played tennis for several hours, then drank a few beers afterward.  At approximately 6 PM this evening, he developed sharp pains in his abdomen along with one episode of nausea and vomiting.  He reports having a bowel movement prior to coming here.  He denies any fevers or chills.  He states that he felt well all day until this episode began.  He reports prior abdominal surgery several years ago for what he describes as his intestine "twisting".     Past Medical History:  Diagnosis Date  . Diabetes mellitus without complication (HCC)   . GERD (gastroesophageal reflux disease)   . High cholesterol   . Hypertension     There are no active problems to display for this patient.   Past Surgical History:  Procedure Laterality Date  . ABDOMINAL SURGERY    . KNEE SURGERY          Home Medications    Prior to Admission medications   Not on File    Family History No family history on file.  Social History Social History   Tobacco Use  . Smoking status: Never Smoker  . Smokeless tobacco: Never Used  Substance Use Topics  . Alcohol use: Yes    Frequency: Never    Comment: occasional  . Drug use: Not on file     Allergies   Patient has no allergy information on record.   Review of Systems Review of Systems  All other systems reviewed and are negative.    Physical Exam Updated Vital Signs BP (!) 153/79   Pulse 71   Temp 98 F (36.7 C) (Oral)   Resp 17   Ht 5\' 5"  (1.651 m)   Wt 79.4 kg   SpO2 98%   BMI 29.12 kg/m   Physical Exam  Constitutional: He is  oriented to person, place, and time. He appears well-developed and well-nourished. No distress.  HENT:  Head: Normocephalic and atraumatic.  Mouth/Throat: Oropharynx is clear and moist.  Neck: Normal range of motion. Neck supple.  Cardiovascular: Normal rate and regular rhythm. Exam reveals no friction rub.  No murmur heard. Pulmonary/Chest: Effort normal and breath sounds normal. No respiratory distress. He has no wheezes. He has no rales.  Abdominal: Soft. Bowel sounds are normal. He exhibits no distension. There is generalized tenderness. There is no rigidity, no rebound and no guarding.  There is generalized abdominal tenderness.  There is some distention with tympany.  Musculoskeletal: Normal range of motion. He exhibits no edema.  Neurological: He is alert and oriented to person, place, and time. Coordination normal.  Skin: Skin is warm and dry. He is not diaphoretic.  Nursing note and vitals reviewed.    ED Treatments / Results  Labs (all labs ordered are listed, but only abnormal results are displayed) Labs Reviewed - No data to display  EKG None  Radiology No results found.  Procedures Procedures (including critical care time)  Medications Ordered in ED Medications  sodium chloride 0.9 % bolus 1,000 mL (has no administration in time range)  ondansetron (ZOFRAN)  injection 4 mg (has no administration in time range)  morphine 4 MG/ML injection 4 mg (has no administration in time range)     Initial Impression / Assessment and Plan / ED Course  I have reviewed the triage vital signs and the nursing notes.  Pertinent labs & imaging results that were available during my care of the patient were reviewed by me and considered in my medical decision making (see chart for details).  Patient presents with abdominal pain and vomiting.  Laboratory studies show a white count of 13,000 and CT scan shows what appears to be a partial small bowel obstruction.  This was discussed  with Dr. Earlene Plater from general surgery who will admit.  Final Clinical Impressions(s) / ED Diagnoses   Final diagnoses:  None    ED Discharge Orders    None       Geoffery Lyons, MD 06/09/18 0139

## 2018-06-09 ENCOUNTER — Inpatient Hospital Stay (HOSPITAL_COMMUNITY): Payer: Medicare Other

## 2018-06-09 DIAGNOSIS — K56609 Unspecified intestinal obstruction, unspecified as to partial versus complete obstruction: Secondary | ICD-10-CM | POA: Diagnosis not present

## 2018-06-09 DIAGNOSIS — K5651 Intestinal adhesions [bands], with partial obstruction: Secondary | ICD-10-CM | POA: Diagnosis present

## 2018-06-09 DIAGNOSIS — E785 Hyperlipidemia, unspecified: Secondary | ICD-10-CM | POA: Diagnosis present

## 2018-06-09 DIAGNOSIS — K219 Gastro-esophageal reflux disease without esophagitis: Secondary | ICD-10-CM | POA: Diagnosis present

## 2018-06-09 DIAGNOSIS — I10 Essential (primary) hypertension: Secondary | ICD-10-CM | POA: Diagnosis present

## 2018-06-09 DIAGNOSIS — E119 Type 2 diabetes mellitus without complications: Secondary | ICD-10-CM | POA: Diagnosis present

## 2018-06-09 DIAGNOSIS — R109 Unspecified abdominal pain: Secondary | ICD-10-CM | POA: Diagnosis present

## 2018-06-09 LAB — GLUCOSE, CAPILLARY: GLUCOSE-CAPILLARY: 129 mg/dL — AB (ref 70–99)

## 2018-06-09 LAB — CBC
HEMATOCRIT: 39.1 % (ref 39.0–52.0)
Hemoglobin: 13.4 g/dL (ref 13.0–17.0)
MCH: 31.8 pg (ref 26.0–34.0)
MCHC: 34.3 g/dL (ref 30.0–36.0)
MCV: 92.9 fL (ref 78.0–100.0)
Platelets: 242 10*3/uL (ref 150–400)
RBC: 4.21 MIL/uL — ABNORMAL LOW (ref 4.22–5.81)
RDW: 14.1 % (ref 11.5–15.5)
WBC: 8.4 10*3/uL (ref 4.0–10.5)

## 2018-06-09 LAB — BASIC METABOLIC PANEL
Anion gap: 9 (ref 5–15)
BUN: 11 mg/dL (ref 8–23)
CHLORIDE: 105 mmol/L (ref 98–111)
CO2: 24 mmol/L (ref 22–32)
CREATININE: 1.07 mg/dL (ref 0.61–1.24)
Calcium: 9.2 mg/dL (ref 8.9–10.3)
GFR calc non Af Amer: >60 mL/min (ref >60)
GLUCOSE: 133 mg/dL — AB (ref 70–99)
Potassium: 3.9 mmol/L (ref 3.5–5.1)
SODIUM: 138 mmol/L (ref 135–145)

## 2018-06-09 LAB — CBC WITH DIFFERENTIAL/PLATELET
BASOS ABS: 0 10*3/uL (ref 0.0–0.1)
Basophils Relative: 0 %
EOS ABS: 0.1 10*3/uL (ref 0.0–0.7)
EOS PCT: 1 %
HCT: 39.3 % (ref 39.0–52.0)
Hemoglobin: 13.4 g/dL (ref 13.0–17.0)
LYMPHS ABS: 1.5 10*3/uL (ref 0.7–4.0)
LYMPHS PCT: 12 %
MCH: 31.8 pg (ref 26.0–34.0)
MCHC: 34.1 g/dL (ref 30.0–36.0)
MCV: 93.3 fL (ref 78.0–100.0)
Monocytes Absolute: 0.8 10*3/uL (ref 0.1–1.0)
Monocytes Relative: 6 %
NEUTROS PCT: 81 %
Neutro Abs: 11 10*3/uL — ABNORMAL HIGH (ref 1.7–7.7)
PLATELETS: 273 10*3/uL (ref 150–400)
RBC: 4.21 MIL/uL — AB (ref 4.22–5.81)
RDW: 14.1 % (ref 11.5–15.5)
WBC: 13.4 10*3/uL — AB (ref 4.0–10.5)

## 2018-06-09 LAB — COMPREHENSIVE METABOLIC PANEL
ALK PHOS: 61 U/L (ref 38–126)
ALT: 32 U/L (ref 0–44)
AST: 38 U/L (ref 15–41)
Albumin: 4.8 g/dL (ref 3.5–5.0)
Anion gap: 10 (ref 5–15)
BILIRUBIN TOTAL: 0.7 mg/dL (ref 0.3–1.2)
BUN: 13 mg/dL (ref 8–23)
CALCIUM: 9.5 mg/dL (ref 8.9–10.3)
CO2: 24 mmol/L (ref 22–32)
CREATININE: 1.33 mg/dL — AB (ref 0.61–1.24)
Chloride: 107 mmol/L (ref 98–111)
GFR, EST NON AFRICAN AMERICAN: 53 mL/min — AB (ref >60)
Glucose, Bld: 150 mg/dL — ABNORMAL HIGH (ref 70–99)
Potassium: 3.3 mmol/L — ABNORMAL LOW (ref 3.5–5.1)
Sodium: 141 mmol/L (ref 135–145)
Total Protein: 8.9 g/dL — ABNORMAL HIGH (ref 6.5–8.1)

## 2018-06-09 LAB — CBG MONITORING, ED
Glucose-Capillary: 119 mg/dL — ABNORMAL HIGH (ref 70–99)
Glucose-Capillary: 117 mg/dL — ABNORMAL HIGH (ref 70–99)

## 2018-06-09 LAB — LIPASE, BLOOD: LIPASE: 55 U/L — AB (ref 11–51)

## 2018-06-09 MED ORDER — KETOROLAC TROMETHAMINE 15 MG/ML IJ SOLN
15.0000 mg | Freq: Once | INTRAMUSCULAR | Status: AC | PRN
  Filled 2018-06-09: qty 1

## 2018-06-09 MED ORDER — FAMOTIDINE IN NACL 20-0.9 MG/50ML-% IV SOLN
20.0000 mg | Freq: Two times a day (BID) | INTRAVENOUS | Status: DC
  Administered 2018-06-09 – 2018-06-11 (×6): 20 mg via INTRAVENOUS
  Filled 2018-06-09 (×8): qty 50

## 2018-06-09 MED ORDER — LACTATED RINGERS IV SOLN
INTRAVENOUS | Status: DC
  Administered 2018-06-09: 02:00:00 via INTRAVENOUS

## 2018-06-09 MED ORDER — IOPAMIDOL (ISOVUE-300) INJECTION 61%
100.0000 mL | Freq: Once | INTRAVENOUS | Status: AC | PRN
  Administered 2018-06-09: 100 mL via INTRAVENOUS

## 2018-06-09 MED ORDER — ONDANSETRON HCL 4 MG/2ML IJ SOLN
4.0000 mg | Freq: Four times a day (QID) | INTRAMUSCULAR | Status: DC | PRN
  Administered 2018-06-09 – 2018-06-13 (×8): 4 mg via INTRAVENOUS
  Filled 2018-06-09 (×9): qty 2

## 2018-06-09 MED ORDER — ENALAPRILAT 1.25 MG/ML IV SOLN
0.6250 mg | Freq: Four times a day (QID) | INTRAVENOUS | Status: DC
  Administered 2018-06-09 – 2018-06-10 (×5): 0.625 mg via INTRAVENOUS
  Filled 2018-06-09 (×5): qty 2

## 2018-06-09 MED ORDER — ENOXAPARIN SODIUM 40 MG/0.4ML ~~LOC~~ SOLN
40.0000 mg | SUBCUTANEOUS | Status: DC
  Administered 2018-06-09 – 2018-06-13 (×5): 40 mg via SUBCUTANEOUS
  Filled 2018-06-09 (×6): qty 0.4

## 2018-06-09 MED ORDER — HYDROMORPHONE HCL 1 MG/ML IJ SOLN
0.5000 mg | INTRAMUSCULAR | Status: DC | PRN
  Administered 2018-06-09 (×3): 0.5 mg via INTRAVENOUS
  Filled 2018-06-09 (×2): qty 1
  Filled 2018-06-09: qty 0.5

## 2018-06-09 MED ORDER — HYDROMORPHONE HCL 1 MG/ML IJ SOLN
1.0000 mg | Freq: Once | INTRAMUSCULAR | Status: AC
  Administered 2018-06-09: 1 mg via INTRAVENOUS

## 2018-06-09 MED ORDER — INSULIN ASPART 100 UNIT/ML ~~LOC~~ SOLN: 0.0000 [IU] | SUBCUTANEOUS | Status: DC

## 2018-06-09 MED ORDER — ONDANSETRON 4 MG PO TBDP: 4.0000 mg | ORAL_TABLET | Freq: Once | ORAL | Status: AC | PRN

## 2018-06-09 MED ORDER — HYDROMORPHONE HCL 1 MG/ML IJ SOLN
1.0000 mg | INTRAMUSCULAR | Status: DC | PRN
  Administered 2018-06-09 – 2018-06-13 (×12): 1 mg via INTRAVENOUS
  Filled 2018-06-09 (×12): qty 1

## 2018-06-09 MED ORDER — HYDROMORPHONE HCL 1 MG/ML IJ SOLN
INTRAMUSCULAR | Status: AC
  Filled 2018-06-09: qty 1

## 2018-06-09 MED ORDER — LACTATED RINGERS IV SOLN
INTRAVENOUS | Status: DC
  Administered 2018-06-09 – 2018-06-11 (×6): via INTRAVENOUS

## 2018-06-09 MED ORDER — ONDANSETRON HCL 4 MG/2ML IJ SOLN
4.0000 mg | Freq: Once | INTRAMUSCULAR | Status: AC | PRN
  Administered 2018-06-09: 4 mg via INTRAVENOUS
  Filled 2018-06-09: qty 2

## 2018-06-09 MED ORDER — MORPHINE SULFATE (PF) 2 MG/ML IV SOLN
4.0000 mg | Freq: Once | INTRAVENOUS | Status: AC
  Administered 2018-06-09: 4 mg via INTRAVENOUS
  Filled 2018-06-09: qty 2

## 2018-06-09 NOTE — H&P (Signed)
[name] Boyle is an 67 y.o. male.   Chief Complaint: Small bowel obstruction HPI: Patient is a 67 year old black male status post exploratory laparotomy 1 year ago in [place] who now presents with a 24-hour history of worsening abdominal pain, nausea, vomiting.  He states his last bowel movement was yesterday and he had 2 bowel movements.  He presented to the emergency room and was found on CT scan of the abdomen to have the partial small bowel obstruction with some mild dilatation of the proximal small bowel.  Patient states that he had a twisting of his small bowel last year and no bowel was resected at the time of the exploratory laparotomy.  This is his first episode since that surgery.  He currently has a pain level 3 out of 10.  Past Medical History:  Diagnosis Date  . Diabetes mellitus without complication ([place])   . GERD (gastroesophageal reflux disease)   . High cholesterol   . Hypertension     Past Surgical History:  Procedure Laterality Date  . ABDOMINAL SURGERY    . KNEE SURGERY      No family history on file. Social History:  reports that he has never smoked. He has never used smokeless tobacco. He reports that he drinks alcohol. His drug history is not on file.  Allergies: Not on File   (Not in a hospital admission)  Results for orders placed or performed during the hospital encounter of 06/08/18 (from the past 48 hour(s))  Comprehensive metabolic panel     Status: Abnormal   Collection Time: 06/08/18 11:46 PM  Result Value Ref Range   Sodium 141 135 - 145 mmol/L   Potassium 3.3 (L) 3.5 - 5.1 mmol/L   Chloride 107 98 - 111 mmol/L   CO2 24 22 - 32 mmol/L   Glucose, Bld 150 (H) 70 - 99 mg/dL   BUN 13 8 - 23 mg/dL   Creatinine, Ser 1.33 (H) 0.61 - 1.24 mg/dL   Calcium 9.5 8.9 - 10.3 mg/dL   Total Protein 8.9 (H) 6.5 - 8.1 g/dL   Albumin 4.8 3.5 - 5.0 g/dL   AST 38 15 - 41 U/L   ALT 32 0 - 44 U/L   Alkaline Phosphatase 61 38 - 126 U/L   Total Bilirubin 0.7 0.3 - 1.2  mg/dL   GFR calc non Af Amer 53 (L) >60 mL/min   GFR calc Af Amer >60 >60 mL/min    Comment: (NOTE) The eGFR has been calculated using the CKD EPI equation. This calculation has not been validated in all clinical situations. eGFR's persistently <60 mL/min signify possible Chronic Kidney Disease.    Anion gap 10 5 - 15    Comment: Performed at [facility], [address], [place], [place] [address]  CBC with Differential     Status: Abnormal   Collection Time: 06/08/18 11:46 PM  Result Value Ref Range   WBC 13.4 (H) 4.0 - 10.5 K/uL   RBC 4.21 (L) 4.22 - 5.81 MIL/uL   Hemoglobin 13.4 13.0 - 17.0 g/dL   HCT 39.3 39.0 - 52.0 %   MCV 93.3 78.0 - 100.0 fL   MCH 31.8 26.0 - 34.0 pg   MCHC 34.1 30.0 - 36.0 g/dL   RDW 14.1 11.5 - 15.5 %   Platelets 273 150 - 400 K/uL   Neutrophils Relative % 81 %   Neutro Abs 11.0 (H) 1.7 - 7.7 K/uL   Lymphocytes Relative 12 %   Lymphs Abs  1.5 0.7 - 4.0 K/uL   Monocytes Relative 6 %   Monocytes Absolute 0.8 0.1 - 1.0 K/uL   Eosinophils Relative 1 %   Eosinophils Absolute 0.1 0.0 - 0.7 K/uL   Basophils Relative 0 %   Basophils Absolute 0.0 0.0 - 0.1 K/uL    Comment: Performed at [facility], [address] [place], [place] [address]  Lipase, blood     Status: Abnormal   Collection Time: 06/08/18 11:46 PM  Result Value Ref Range   Lipase 55 (H) 11 - 51 U/L    Comment: Performed at [facility], [address], [place], [place] [address]  Basic metabolic panel     Status: Abnormal   Collection Time: 06/09/18  4:58 AM  Result Value Ref Range   Sodium 138 135 - 145 mmol/L   Potassium 3.9 3.5 - 5.1 mmol/L   Chloride 105 98 - 111 mmol/L   CO2 24 22 - 32 mmol/L   Glucose, Bld 133 (H) 70 - 99 mg/dL   BUN 11 8 - 23 mg/dL   Creatinine, Ser 1.07 0.61 - 1.24 mg/dL   Calcium 9.2 8.9 - 10.3 mg/dL   GFR calc non Af Amer >60 >60 mL/min   GFR calc Af Amer >60 >60 mL/min    Comment: (NOTE) The eGFR has been calculated using the CKD EPI equation. This  calculation has not been validated in all clinical situations. eGFR's persistently <60 mL/min signify possible Chronic Kidney Disease.    Anion gap 9 5 - 15    Comment: Performed at [facility], [address], [place], [place] [address]  CBC     Status: Abnormal   Collection Time: 06/09/18  4:58 AM  Result Value Ref Range   WBC 8.4 4.0 - 10.5 K/uL   RBC 4.21 (L) 4.22 - 5.81 MIL/uL   Hemoglobin 13.4 13.0 - 17.0 g/dL   HCT 39.1 39.0 - 52.0 %   MCV 92.9 78.0 - 100.0 fL   MCH 31.8 26.0 - 34.0 pg   MCHC 34.3 30.0 - 36.0 g/dL   RDW 14.1 11.5 - 15.5 %   Platelets 242 150 - 400 K/uL    Comment: Performed at [facility], [address] [place], [place] [address]  CBG monitoring, ED     Status: Abnormal   Collection Time: 06/09/18  5:20 AM  Result Value Ref Range   Glucose-Capillary 119 (H) 70 - 99 mg/dL   Comment 1 Notify RN    Comment 2 Document in Chart    Ct Abdomen Pelvis W Contrast  Result Date: 06/09/2018 CLINICAL DATA:  Abdominal pain, nausea/vomiting EXAM: CT ABDOMEN AND PELVIS WITH CONTRAST TECHNIQUE: Multidetector CT imaging of the abdomen and pelvis was performed using the standard protocol following bolus administration of intravenous contrast. CONTRAST:  142m ISOVUE-300 IOPAMIDOL (ISOVUE-300) INJECTION 61% COMPARISON:  None. FINDINGS: Lower chest: Eventration of left hemidiaphragm with associated left basilar atelectasis. Hepatobiliary: Liver is within normal limits. Gallbladder is unremarkable. No intrahepatic or extrahepatic ductal dilatation. Pancreas: Within normal limits. Spleen: Within normal limits. Adrenals/Urinary Tract: Adrenal glands are within normal limits. Right renal cortical scarring. 17 mm hyperdense lesion along the posterior right upper kidney (series 2/image 33), without de-enhancement on delayed imaging, favoring a benign hemorrhagic cyst. Additional simple bilateral renal cysts, measuring up to 1.8 cm in the lateral left lower kidney (series 2/image 39). No  hydronephrosis. Bladder is within normal limits. Stomach/Bowel: Stomach is within normal limits. Dilated loops of small bowel in the  left mid abdomen (series 2/image 40), with mild mesenteric edema, raising concern for partial small bowel obstruction or less likely small bowel enteritis. No pneumatosis or free air. Normal appendix (series 2/image 51). Colon is relatively decompressed. Transverse colon is mildly thick-walled but underdistended (series 2/image 43). Vascular/Lymphatic: No evidence of abdominal aortic aneurysm. Atherosclerotic calcifications of the abdominal aorta and branch vessels. No suspicious abdominopelvic lymphadenopathy. Reproductive: Prostate is unremarkable. Other: No abdominopelvic ascites. Musculoskeletal: Degenerative changes of the visualized thoracolumbar spine. IMPRESSION: Dilated loops of small bowel in the left mid abdomen, with mild mesenteric edema, raising concern for partial small bowel obstruction or less likely small bowel enteritis. No pneumatosis or free air. 1.8 cm hyperdense lesion in the lateral left lower kidney, favoring a hemorrhagic cyst, although technically indeterminate (Bosniak IIB) in the absence of pre-contrast imaging. Consider follow-up MRI abdomen with/without contrast in 6 months. Electronically Signed   By: Julian Hy M.D.   On: 06/09/2018 01:00   Dg Chest Portable 1 View  Result Date: 06/09/2018 CLINICAL DATA:  NG tube placement EXAM: PORTABLE CHEST 1 VIEW COMPARISON:  None. FINDINGS: Lungs are clear. Mild eventration left hemidiaphragm. No pleural effusion or pneumothorax. The heart is top-normal in size. Enteric tube looped in the stomach and terminating in the mid gastric body. IMPRESSION: Enteric tube terminating in the mid gastric body. Electronically Signed   By: Julian Hy M.D.   On: 06/09/2018 02:50    Review of Systems  Constitutional: Positive for malaise/fatigue.  HENT: Negative.   Eyes: Negative.   Respiratory: Negative.    Cardiovascular: Negative.   Gastrointestinal: Positive for abdominal pain and nausea.  Genitourinary: Negative.   Skin: Negative.   Neurological: Negative.   Endo/Heme/Allergies: Negative.   Psychiatric/Behavioral: Negative.     Blood pressure (!) 176/111, pulse 64, temperature 98 F (36.7 C), temperature source Oral, resp. rate 20, height '5\' 5"'  (1.651 m), weight 79.4 kg, SpO2 96 %. Physical Exam  Vitals reviewed. Constitutional: He is oriented to person, place, and time. He appears well-developed and well-nourished. No distress.  HENT:  Head: Normocephalic and atraumatic.  Cardiovascular: Normal rate and regular rhythm. Exam reveals no gallop and no friction rub.  No murmur heard. Respiratory: Effort normal and breath sounds normal. No respiratory distress. He has no wheezes. He has no rales.  GI: Soft. He exhibits no distension. There is no tenderness. There is no rebound and no guarding.  Occasional bowel sounds appreciated.  No rigidity is noted.  Neurological: He is alert and oriented to person, place, and time.  Skin: Skin is warm and dry.    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  No internal hernia appreciated at this time. Plan: Will admit to the hospital and decompress the bowel.  Hopefully we can avoid another exploratory laparotomy.  The treatment plan was discussed with patient, who understands and agrees.  Aviva Signs, MD 06/09/2018, 10:45 AM

## 2018-06-09 NOTE — Progress Notes (Signed)
Patient with abdominal pain, no flatus despite BM today, and a single episode of non-bloody emesis discussed with ED physician, Dr. Judd Lien. Chart, labs, and CT personally reviewed. Will admit to surgical service for SBO s/p prior open abdominal surgery. If patient experiences pain or nausea, insert NG tube and will order a single dose of prn pain medication and anti-emetic. Full H&P to follow. Please call if any questions or concerns.  -- Scherrie Gerlach Earlene Plater, MD, RPVI [place]: [facility] - Partnering for exceptional care. [facility] Office: [phone]

## 2018-06-09 NOTE — ED Notes (Signed)
Pt still c/o nausea and pain so NG tube placed.

## 2018-06-10 ENCOUNTER — Inpatient Hospital Stay (HOSPITAL_COMMUNITY): Payer: Medicare Other

## 2018-06-10 LAB — BASIC METABOLIC PANEL
Anion gap: 10 (ref 5–15)
BUN: 13 mg/dL (ref 8–23)
CALCIUM: 9 mg/dL (ref 8.9–10.3)
CO2: 25 mmol/L (ref 22–32)
Chloride: 106 mmol/L (ref 98–111)
Creatinine, Ser: 1.28 mg/dL — ABNORMAL HIGH (ref 0.61–1.24)
GFR, EST NON AFRICAN AMERICAN: 56 mL/min — AB (ref >60)
GLUCOSE: 119 mg/dL — AB (ref 70–99)
Potassium: 3.9 mmol/L (ref 3.5–5.1)
Sodium: 141 mmol/L (ref 135–145)

## 2018-06-10 LAB — GLUCOSE, CAPILLARY
GLUCOSE-CAPILLARY: 104 mg/dL — AB (ref 70–99)
GLUCOSE-CAPILLARY: 105 mg/dL — AB (ref 70–99)
GLUCOSE-CAPILLARY: 106 mg/dL — AB (ref 70–99)
GLUCOSE-CAPILLARY: 119 mg/dL — AB (ref 70–99)
GLUCOSE-CAPILLARY: 120 mg/dL — AB (ref 70–99)
Glucose-Capillary: 116 mg/dL — ABNORMAL HIGH (ref 70–99)
Glucose-Capillary: 113 mg/dL — ABNORMAL HIGH (ref 70–99)
Glucose-Capillary: 120 mg/dL — ABNORMAL HIGH (ref 70–99)
Glucose-Capillary: 101 mg/dL — ABNORMAL HIGH (ref 70–99)

## 2018-06-10 LAB — CBC
HCT: 40.4 % (ref 39.0–52.0)
Hemoglobin: 13.7 g/dL (ref 13.0–17.0)
MCH: 32.2 pg (ref 26.0–34.0)
MCHC: 33.9 g/dL (ref 30.0–36.0)
MCV: 94.8 fL (ref 78.0–100.0)
PLATELETS: 253 10*3/uL (ref 150–400)
RBC: 4.26 MIL/uL (ref 4.22–5.81)
RDW: 14.5 % (ref 11.5–15.5)
WBC: 7.7 10*3/uL (ref 4.0–10.5)

## 2018-06-10 LAB — PHOSPHORUS: Phosphorus: 3.7 mg/dL (ref 2.5–4.6)

## 2018-06-10 LAB — HIV ANTIBODY (ROUTINE TESTING W REFLEX): HIV SCREEN 4TH GENERATION: NONREACTIVE

## 2018-06-10 LAB — MAGNESIUM: Magnesium: 2 mg/dL (ref 1.7–2.4)

## 2018-06-10 MED ORDER — ENALAPRILAT 1.25 MG/ML IV SOLN
1.2500 mg | Freq: Four times a day (QID) | INTRAVENOUS | Status: DC
  Administered 2018-06-10 – 2018-06-11 (×7): 1.25 mg via INTRAVENOUS
  Filled 2018-06-10 (×7): qty 2

## 2018-06-10 MED ORDER — BISACODYL 10 MG RE SUPP
10.0000 mg | Freq: Two times a day (BID) | RECTAL | Status: DC
  Administered 2018-06-10 – 2018-06-11 (×3): 10 mg via RECTAL
  Filled 2018-06-10 (×5): qty 1

## 2018-06-10 NOTE — Progress Notes (Signed)
Subjective: Denies any abdominal pain.  No bowel movement or flatus yet.  Objective: Vital signs in last 24 hours: Temp:  [97.7 F (36.5 C)-100.2 F (37.9 C)] 100.2 F (37.9 C) (10/01 0537) Pulse Rate:  [56-68] 68 (10/01 0537) Resp:  [18] 18 (10/01 0537) BP: (135-176)/(82-111) 164/96 (10/01 0537) SpO2:  [92 %-98 %] 96 % (10/01 0537) Last BM Date: 06/08/18  Intake/Output from previous day: 09/30 0701 - 10/01 0700 In: 3307.7 [I.V.:3107.7; IV Piggyback:200] Out: 50 [Emesis/NG output:50] Intake/Output this shift: No intake/output data recorded.  General appearance: alert, cooperative and no distress Resp: clear to auscultation bilaterally Cardio: regular rate and rhythm, S1, S2 normal, no murmur, click, rub or gallop GI: Soft, mildly distended.  No bowel sounds appreciated.  No tenderness or rigidity noted.  Lab Results:  Recent Labs    06/09/18 0458 06/10/18 0520  WBC 8.4 7.7  HGB 13.4 13.7  HCT 39.1 40.4  PLT 242 253   BMET Recent Labs    06/09/18 0458 06/10/18 0520  NA 138 141  K 3.9 3.9  CL 105 106  CO2 24 25  GLUCOSE 133* 119*  BUN 11 13  CREATININE 1.07 1.28*  CALCIUM 9.2 9.0   PT/INR No results for input(s): LABPROT, INR in the last 72 hours.  Studies/Results: Ct Abdomen Pelvis W Contrast  Result Date: 06/09/2018 CLINICAL DATA:  Abdominal pain, nausea/vomiting EXAM: CT ABDOMEN AND PELVIS WITH CONTRAST TECHNIQUE: Multidetector CT imaging of the abdomen and pelvis was performed using the standard protocol following bolus administration of intravenous contrast. CONTRAST:  ISOVUE-300 IOPAMIDOL (ISOVUE-300) INJECTION 61% COMPARISON:  None. FINDINGS: Lower chest: Eventration of left hemidiaphragm with associated left basilar atelectasis. Hepatobiliary: Liver is within normal limits. Gallbladder is unremarkable. No intrahepatic or extrahepatic ductal dilatation. Pancreas: Within normal limits. Spleen: Within normal limits. Adrenals/Urinary Tract: Adrenal  glands are within normal limits. Right renal cortical scarring. 17 mm hyperdense lesion along the posterior right upper kidney (series 2/image 33), without de-enhancement on delayed imaging, favoring a benign hemorrhagic cyst. Additional simple bilateral renal cysts, measuring up to 1.8 cm in the lateral left lower kidney (series 2/image 39). No hydronephrosis. Bladder is within normal limits. Stomach/Bowel: Stomach is within normal limits. Dilated loops of small bowel in the left mid abdomen (series 2/image 40), with mild mesenteric edema, raising concern for partial small bowel obstruction or less likely small bowel enteritis. No pneumatosis or free air. Normal appendix (series 2/image 51). Colon is relatively decompressed. Transverse colon is mildly thick-walled but underdistended (series 2/image 43). Vascular/Lymphatic: No evidence of abdominal aortic aneurysm. Atherosclerotic calcifications of the abdominal aorta and branch vessels. No suspicious abdominopelvic lymphadenopathy. Reproductive: Prostate is unremarkable. Other: No abdominopelvic ascites. Musculoskeletal: Degenerative changes of the visualized thoracolumbar spine. IMPRESSION: Dilated loops of small bowel in the left mid abdomen, with mild mesenteric edema, raising concern for partial small bowel obstruction or less likely small bowel enteritis. No pneumatosis or free air. 1.8 cm hyperdense lesion in the lateral left lower kidney, favoring a hemorrhagic cyst, although technically indeterminate (Bosniak IIB) in the absence of pre-contrast imaging. Consider follow-up MRI abdomen with/without contrast in 6 months. Electronically Signed   By: Charline Bills M.D.   On: 06/09/2018 01:00   Dg Chest Portable 1 View  Result Date: 06/09/2018 CLINICAL DATA:  NG tube placement EXAM: PORTABLE CHEST 1 VIEW COMPARISON:  None. FINDINGS: Lungs are clear. Mild eventration left hemidiaphragm. No pleural effusion or pneumothorax. The heart is top-normal in size.  Enteric tube looped in  the stomach and terminating in the mid gastric body. IMPRESSION: Enteric tube terminating in the mid gastric body. Electronically Signed   By: Charline Bills M.D.   On: 06/09/2018 02:50    Anti-infectives: Anti-infectives (From admission, onward)   None      Assessment/Plan: Impression: Small bowel obstruction.  No need for acute surgical intervention at this time.  Continue current treatment.  LOS: 1 day    Franky Macho 06/10/2018

## 2018-06-11 LAB — CBC
HCT: 37.8 % — ABNORMAL LOW (ref 39.0–52.0)
HEMOGLOBIN: 12.4 g/dL — AB (ref 13.0–17.0)
MCH: 31.4 pg (ref 26.0–34.0)
MCHC: 32.8 g/dL (ref 30.0–36.0)
MCV: 95.7 fL (ref 78.0–100.0)
Platelets: 221 10*3/uL (ref 150–400)
RBC: 3.95 MIL/uL — ABNORMAL LOW (ref 4.22–5.81)
RDW: 14.5 % (ref 11.5–15.5)
WBC: 8.3 10*3/uL (ref 4.0–10.5)

## 2018-06-11 LAB — BASIC METABOLIC PANEL
Anion gap: 8 (ref 5–15)
BUN: 13 mg/dL (ref 8–23)
CALCIUM: 8.8 mg/dL — AB (ref 8.9–10.3)
CHLORIDE: 108 mmol/L (ref 98–111)
CO2: 26 mmol/L (ref 22–32)
CREATININE: 1.14 mg/dL (ref 0.61–1.24)
GFR calc Af Amer: >60 mL/min (ref >60)
GFR calc non Af Amer: >60 mL/min (ref >60)
Glucose, Bld: 107 mg/dL — ABNORMAL HIGH (ref 70–99)
Potassium: 3.6 mmol/L (ref 3.5–5.1)
Sodium: 142 mmol/L (ref 135–145)

## 2018-06-11 LAB — GLUCOSE, CAPILLARY
GLUCOSE-CAPILLARY: 118 mg/dL — AB (ref 70–99)
GLUCOSE-CAPILLARY: 99 mg/dL (ref 70–99)
GLUCOSE-CAPILLARY: 98 mg/dL (ref 70–99)
Glucose-Capillary: 103 mg/dL — ABNORMAL HIGH (ref 70–99)
Glucose-Capillary: 109 mg/dL — ABNORMAL HIGH (ref 70–99)
Glucose-Capillary: 93 mg/dL (ref 70–99)

## 2018-06-11 NOTE — Care Management Important Message (Signed)
Important Message  Patient Details  Name: [name] Boyle MRN: [id number] Date of Birth: 1951/03/18   Medicare Important Message Given:  Yes    Renie Ora 06/11/2018, 11:07 AM

## 2018-06-11 NOTE — Progress Notes (Signed)
  Subjective: No significant changes since yesterday.  No increasing abdominal pain noted.  No bowel movement or flatus yet.  Objective: Vital signs in last 24 hours: Temp:  [99.2 F (37.3 C)-100.2 F (37.9 C)] 100.2 F (37.9 C) (10/02 0428) Pulse Rate:  [56-66] 64 (10/02 0428) Resp:  [18] 18 (10/02 0428) BP: (129-177)/(83-96) 159/88 (10/02 0428) SpO2:  [94 %-95 %] 95 % (10/02 0428) Last BM Date: 06/08/18  Intake/Output from previous day: 10/01 0701 - 10/02 0700 In: 2221.1 [P.O.:120; I.V.:2001.1; IV Piggyback:100] Out: 300 [Emesis/NG output:300] Intake/Output this shift: No intake/output data recorded.  General appearance: alert, cooperative and no distress Resp: clear to auscultation bilaterally Cardio: regular rate and rhythm, S1, S2 normal, no murmur, click, rub or gallop GI: Soft, slightly less distended.  Bowel sounds present.  No rigidity noted.  No tenderness noted.  Lab Results:  Recent Labs    06/10/18 0520 06/11/18 0444  WBC 7.7 8.3  HGB 13.7 12.4*  HCT 40.4 37.8*  PLT 253 221   BMET Recent Labs    06/10/18 0520 06/11/18 0444  NA 141 142  K 3.9 3.6  CL 106 108  CO2 25 26  GLUCOSE 119* 107*  BUN 13 13  CREATININE 1.28* 1.14  CALCIUM 9.0 8.8*   PT/INR No results for input(s): LABPROT, INR in the last 72 hours.  Studies/Results: Dg Abd 1 View  Result Date: 06/10/2018 CLINICAL DATA:  67 year old male. Nasogastric tube placement. Small bowel obstruction. Subsequent encounter. EXAM: ABDOMEN - 1 VIEW COMPARISON:  06/09/2018 CT. FINDINGS: Nasogastric tube tip curled within the gastric fundus region. Small bowel obstructive pattern with gas distended small bowel loops measuring up to 5 cm. The possibility of free intraperitoneal air cannot be assessed on a supine view. Possible small pleural effusion/basilar atelectasis. IMPRESSION: 1. Nasogastric tube tip curled within the gastric fundus region. 2. Small bowel obstructive pattern with gas distended small  bowel loops measuring up to 5 cm. The possibility of free intraperitoneal air cannot be assessed on a supine view. Electronically Signed   By: Lacy Duverney M.D.   On: 06/10/2018 18:43    Anti-infectives: Anti-infectives (From admission, onward)   None      Assessment/Plan: Impression: Small bowel obstruction, bowel sounds now more active Plan: Continue current decompressive therapy.  No need for acute surgical intervention today.  LOS: 2 days    Franky Macho 06/11/2018

## 2018-06-11 NOTE — Care Management Important Message (Signed)
Important Message  Patient Details  Name: [name] Boyle MRN: [id number] Date of Birth: 02-22-1951   Medicare Important Message Given:  Yes    Renie Ora 06/11/2018, 11:24 AM

## 2018-06-12 ENCOUNTER — Ambulatory Visit (INDEPENDENT_AMBULATORY_CARE_PROVIDER_SITE_OTHER): Payer: Self-pay | Admitting: Internal Medicine

## 2018-06-12 LAB — GLUCOSE, CAPILLARY
GLUCOSE-CAPILLARY: 119 mg/dL — AB (ref 70–99)
GLUCOSE-CAPILLARY: 92 mg/dL (ref 70–99)
GLUCOSE-CAPILLARY: 99 mg/dL (ref 70–99)
Glucose-Capillary: 115 mg/dL — ABNORMAL HIGH (ref 70–99)
Glucose-Capillary: 103 mg/dL — ABNORMAL HIGH (ref 70–99)
Glucose-Capillary: 91 mg/dL (ref 70–99)

## 2018-06-12 MED ORDER — RANITIDINE HCL 150 MG/10ML PO SYRP: 150.0000 mg | ORAL_SOLUTION | Freq: Two times a day (BID) | ORAL | Status: DC

## 2018-06-12 MED ORDER — AMLODIPINE BESYLATE 5 MG PO TABS
10.0000 mg | ORAL_TABLET | Freq: Every day | ORAL | Status: DC
  Administered 2018-06-12 – 2018-06-13 (×2): 10 mg via ORAL
  Filled 2018-06-12 (×3): qty 2

## 2018-06-12 MED ORDER — ALUM & MAG HYDROXIDE-SIMETH 200-200-20 MG/5ML PO SUSP
30.0000 mL | Freq: Once | ORAL | Status: AC
  Administered 2018-06-13: 30 mL via ORAL
  Filled 2018-06-12 (×2): qty 30

## 2018-06-12 MED ORDER — INSULIN ASPART 100 UNIT/ML ~~LOC~~ SOLN: 0.0000 [IU] | Freq: Three times a day (TID) | SUBCUTANEOUS | Status: DC

## 2018-06-12 MED ORDER — ENALAPRIL MALEATE 5 MG PO TABS
10.0000 mg | ORAL_TABLET | Freq: Every day | ORAL | Status: DC
  Administered 2018-06-12 – 2018-06-13 (×2): 10 mg via ORAL
  Filled 2018-06-12 (×3): qty 2

## 2018-06-12 MED ORDER — PANTOPRAZOLE SODIUM 40 MG PO TBEC
40.0000 mg | DELAYED_RELEASE_TABLET | Freq: Every day | ORAL | Status: DC
  Administered 2018-06-12 (×2): 40 mg via ORAL
  Filled 2018-06-12 (×2): qty 1

## 2018-06-12 MED ORDER — HYDROCODONE-ACETAMINOPHEN 5-325 MG PO TABS: 1.0000 | ORAL_TABLET | ORAL | Status: DC | PRN

## 2018-06-12 NOTE — Progress Notes (Signed)
C/o heartburn. Pt requested Zantac 150 which is not available in house, Pepsid 20mg  is the equivalent.

## 2018-06-12 NOTE — Progress Notes (Signed)
  Subjective: Patient denies abdominal pain.  Has had multiple bowel movements and passed flatus.  Objective: Vital signs in last 24 hours: Temp:  [98.7 F (37.1 C)-98.8 F (37.1 C)] 98.7 F (37.1 C) (10/03 0523) Pulse Rate:  [60-67] 60 (10/03 0523) Resp:  [18-20] 20 (10/03 0523) BP: (157-174)/(76-97) 160/87 (10/03 0523) SpO2:  [91 %-98 %] 91 % (10/03 0523) Last BM Date: 06/11/18  Intake/Output from previous day: 10/02 0701 - 10/03 0700 In: 2918.9 [P.O.:480; I.V.:2388.9; IV Piggyback:50] Out: 550 [Urine:400; Emesis/NG output:150] Intake/Output this shift: No intake/output data recorded.  General appearance: alert, cooperative and no distress Resp: clear to auscultation bilaterally Cardio: regular rate and rhythm, S1, S2 normal, no murmur, click, rub or gallop GI: soft, non-tender; bowel sounds normal; no masses,  no organomegaly  Lab Results:  Recent Labs    06/10/18 0520 06/11/18 0444  WBC 7.7 8.3  HGB 13.7 12.4*  HCT 40.4 37.8*  PLT 253 221   BMET Recent Labs    06/10/18 0520 06/11/18 0444  NA 141 142  K 3.9 3.6  CL 106 108  CO2 25 26  GLUCOSE 119* 107*  BUN 13 13  CREATININE 1.28* 1.14  CALCIUM 9.0 8.8*   PT/INR No results for input(s): LABPROT, INR in the last 72 hours.  Studies/Results: Dg Abd 1 View  Result Date: 06/10/2018 CLINICAL DATA:  67 year old male. Nasogastric tube placement. Small bowel obstruction. Subsequent encounter. EXAM: ABDOMEN - 1 VIEW COMPARISON:  06/09/2018 CT. FINDINGS: Nasogastric tube tip curled within the gastric fundus region. Small bowel obstructive pattern with gas distended small bowel loops measuring up to 5 cm. The possibility of free intraperitoneal air cannot be assessed on a supine view. Possible small pleural effusion/basilar atelectasis. IMPRESSION: 1. Nasogastric tube tip curled within the gastric fundus region. 2. Small bowel obstructive pattern with gas distended small bowel loops measuring up to 5 cm. The  possibility of free intraperitoneal air cannot be assessed on a supine view. Electronically Signed   By: Lacy Duverney M.D.   On: 06/10/2018 18:43    Anti-infectives: Anti-infectives (From admission, onward)   None      Assessment/Plan: Impression: Small bowel obstruction resolving. Plan: Will advance diet.  Hep-Lock IV.  Restart p.o. medications.  Anticipate discharge in next 24 to 48 hours.  Continue to monitor blood sugars.  LOS: 3 days    Franky Macho 06/12/2018

## 2018-06-13 LAB — GLUCOSE, CAPILLARY
GLUCOSE-CAPILLARY: 112 mg/dL — AB (ref 70–99)
GLUCOSE-CAPILLARY: 102 mg/dL — AB (ref 70–99)
Glucose-Capillary: 122 mg/dL — ABNORMAL HIGH (ref 70–99)
Glucose-Capillary: 95 mg/dL (ref 70–99)
Glucose-Capillary: 99 mg/dL (ref 70–99)

## 2018-06-13 MED ORDER — BISACODYL 5 MG PO TBEC
5.0000 mg | DELAYED_RELEASE_TABLET | Freq: Every day | ORAL | Status: DC | PRN
  Administered 2018-06-13: 5 mg via ORAL
  Filled 2018-06-13: qty 1

## 2018-06-13 MED ORDER — ALUM & MAG HYDROXIDE-SIMETH 200-200-20 MG/5ML PO SUSP
30.0000 mL | ORAL | Status: DC | PRN
  Administered 2018-06-13: 30 mL via ORAL
  Filled 2018-06-13: qty 30

## 2018-06-13 MED ORDER — POLYETHYLENE GLYCOL 3350 17 G PO PACK
17.0000 g | PACK | Freq: Once | ORAL | Status: AC
  Administered 2018-06-13: 17 g via ORAL
  Filled 2018-06-13: qty 1

## 2018-06-13 MED ORDER — MAGNESIUM HYDROXIDE 400 MG/5ML PO SUSP
30.0000 mL | Freq: Every day | ORAL | Status: DC
  Administered 2018-06-13: 30 mL via ORAL
  Filled 2018-06-13: qty 30

## 2018-06-13 MED ORDER — PANTOPRAZOLE SODIUM 40 MG PO TBEC
40.0000 mg | DELAYED_RELEASE_TABLET | Freq: Two times a day (BID) | ORAL | Status: DC
  Administered 2018-06-13 (×2): 40 mg via ORAL
  Filled 2018-06-13 (×3): qty 1

## 2018-06-13 NOTE — Progress Notes (Signed)
  Subjective: Patient states he has not had a bowel movement since yesterday.  He does feel gremlins in his stomach.  Some crampy abdominal pain noted.  Objective: Vital signs in last 24 hours: Temp:  [98 F (36.7 C)-98.8 F (37.1 C)] 98 F (36.7 C) (10/04 0526) Pulse Rate:  [55-59] 58 (10/04 0526) Resp:  [18] 18 (10/04 0526) BP: (146-161)/(73-89) 146/89 (10/04 0526) SpO2:  [94 %-97 %] 94 % (10/04 0526) Last BM Date: 06/12/18  Intake/Output from previous day: 10/03 0701 - 10/04 0700 In: 1200 [P.O.:1200] Out: 3 [Stool:3] Intake/Output this shift: No intake/output data recorded.  General appearance: alert, cooperative and no distress Resp: clear to auscultation bilaterally Cardio: regular rate and rhythm, S1, S2 normal, no murmur, click, rub or gallop GI: soft, non-tender; bowel sounds normal; no masses,  no organomegaly  Lab Results:  Recent Labs    06/11/18 0444  WBC 8.3  HGB 12.4*  HCT 37.8*  PLT 221   BMET Recent Labs    06/11/18 0444  NA 142  K 3.6  CL 108  CO2 26  GLUCOSE 107*  BUN 13  CREATININE 1.14  CALCIUM 8.8*   PT/INR No results for input(s): LABPROT, INR in the last 72 hours.  Studies/Results: No results found.  Anti-infectives: Anti-infectives (From admission, onward)   None      Assessment/Plan: Imp: Partial small bowel obstruction, resolving Plan: We will give cathartics today.  Hopefully will discharge later today if bowel function returns.  No need for acute surgical intervention.  LOS: 4 days    Franky Macho 06/13/2018

## 2018-06-13 NOTE — Progress Notes (Signed)
Pt c/o of heart burn, stomach rumbling and nausea. Pt stated that he eat a barbeque sandwich yesterday about 1700hrs, acid reflux occurred with coughing and cramping. Last BM was about 1600hrs. See MAR. Will continue to monitor.

## 2018-06-13 NOTE — Care Management Important Message (Signed)
Important Message  Patient Details  Name: [name] Boyle MRN: [id number] Date of Birth: 04/12/1951   Medicare Important Message Given:  Yes    Renie Ora 06/13/2018, 10:49 AM

## 2018-06-14 LAB — GLUCOSE, CAPILLARY: GLUCOSE-CAPILLARY: 94 mg/dL (ref 70–99)

## 2018-06-14 NOTE — Progress Notes (Signed)
Discharge instructions reviewed with patient. Given copy of AVS. No new prescriptions at discharge. Patient verbalized understanding of instructions, f/u with Dr. Lovell Sheehan as needed and to seek medical attention if symptoms return. IV site d/c'd, site within normal limits. Belongings returned to him from security this am. Pt in stable condition awaiting w/c and nurse tech to assist off unit for discharge home. Earnstine Regal, RN

## 2018-06-14 NOTE — Discharge Summary (Signed)
Physician Discharge Summary  Patient ID: [name] Boyle MRN: [id number] DOB/AGE: 1951-03-30 67 y.o.  Admit date: 06/08/2018 Discharge date: 06/14/2018  Admission Diagnoses: Small bowel obstruction  Discharge Diagnoses: Same Active Problems:   Small bowel obstruction (HCC) Hypertension  Discharged Condition: good  Hospital Course: Patient is a 67 year old black male who was admitted to the hospital on 06/08/2018 with worsening abdominal pain, distention, nausea, vomiting.  CT scan of the abdomen revealed a partial small bowel obstruction.  Patient had a bowel obstruction 1 year ago which was treated with an exploratory laparotomy.  This was done at another facility.  No resection of bowel was noted.  He was told he had a twist of the bowel.  There was no evidence of an internal hernia on CT scan of the abdomen this admission.  He was admitted to the hospital for bowel rest and IV fluids.  The NG tube was ultimately removed once bowel function returned.  His diet was advanced without difficulty.  He also did not move his bowels for 24 hours after the NG tube was removed and this resolved with cathartics. Patient is being discharged home on 06/14/2018 in good and improving condition.  Discharge Exam: Blood pressure (!) 160/80, pulse (!) 51, temperature 98.5 F (36.9 C), temperature source Oral, resp. rate 16, height 5\' 5"  (1.651 m), weight 79.4 kg, SpO2 96 %. General appearance: alert, cooperative and no distress Resp: clear to auscultation bilaterally Cardio: regular rate and rhythm, S1, S2 normal, no murmur, click, rub or gallop GI: soft, non-tender; bowel sounds normal; no masses,  no organomegaly  Disposition: Discharge disposition: 01-Home or Self Care       Discharge Instructions    Diet - low sodium heart healthy   Complete by:  As directed    Increase activity slowly   Complete by:  As directed      Allergies as of 06/14/2018   No Known Allergies     Medication List     TAKE these medications   amLODipine 10 MG tablet Commonly known as:  NORVASC Take 10 mg by mouth daily.   aspirin 81 MG chewable tablet Chew by mouth daily.   atorvastatin 40 MG tablet Commonly known as:  LIPITOR Take 40 mg by mouth daily.   azelastine 0.1 % nasal spray Commonly known as:  ASTELIN Place into both nostrils 2 (two) times daily. Use in each nostril as directed   co-enzyme Q-10 30 MG capsule Take 300 mg by mouth daily.   docusate sodium 50 MG capsule Commonly known as:  COLACE Take 50 mg by mouth 2 (two) times daily.   enalapril 10 MG tablet Commonly known as:  VASOTEC Take 10 mg by mouth 2 (two) times daily.   FISH OIL PO Take 1,400 mg by mouth.   fluticasone 50 MCG/ACT nasal spray Commonly known as:  FLONASE Place into both nostrils daily.   HM FEXOFENADINE HCL 180 MG tablet Generic drug:  fexofenadine Take 180 mg by mouth daily.   lansoprazole 30 MG capsule Commonly known as:  PREVACID Take 30 mg by mouth daily at 12 noon.   multivitamin capsule Take 1 capsule by mouth daily.   ranitidine 150 MG tablet Commonly known as:  ZANTAC Take 150 mg by mouth 2 (two) times daily.   sitaGLIPtin 100 MG tablet Commonly known as:  JANUVIA Take 100 mg by mouth daily.   STELARA 45 MG/0.5ML Soln Generic drug:  Ustekinumab Inject into the skin.   triamcinolone cream 0.1 %  Commonly known as:  KENALOG APPLY TO AFFECTED AREA ON BACK TWICE A DAY AS NEEDED FOR PSORIASIS/ITCHING   VIMOVO 500-20 MG Tbec Generic drug:  Naproxen-Esomeprazole Take by mouth as needed.   vitamin C 1000 MG tablet Take 1,000 mg by mouth daily.   Vitamin D3 5000 units Caps Take 1 capsule by mouth daily.      Follow-up Information    Franky Macho, MD Follow up.   Specialty:  General Surgery Why:  as needed Contact information: [address] [place] [address] [phone]           Signed: Franky Macho 06/14/2018, 7:41 AM

## 2018-06-14 NOTE — Discharge Instructions (Signed)
Small Bowel Obstruction °A small bowel obstruction is a blockage in the small bowel. The small bowel, which is also called the small intestine, is a long, slender tube that connects the stomach to the colon. When a person eats and drinks, food and fluids go from the stomach to the small bowel. This is where most of the nutrients in the food and fluids are absorbed. °A small bowel obstruction will prevent food and fluids from passing through the small bowel as they normally do during digestion. The small bowel can become partially or completely blocked. This can cause symptoms such as abdominal pain, vomiting, and bloating. If this condition is not treated, it can be dangerous because the small bowel could rupture. °What are the causes? °Common causes of this condition include: °· Scar tissue from previous surgery or radiation treatment. °· Recent surgery. This may cause the movements of the bowel to slow down and cause food to block the intestine. °· Hernias. °· Inflammatory bowel disease (colitis). °· Twisting of the bowel (volvulus). °· Tumors. °· A foreign body. °· Slipping of a part of the bowel into another part (intussusception). ° °What are the signs or symptoms? °Symptoms of this condition include: °· Abdominal pain. This may be dull cramps or sharp pain. It may occur in one area, or it may be present in the entire abdomen. Pain can range from mild to severe, depending on the degree of obstruction. °· Nausea and vomiting. Vomit may be greenish or a yellow bile color. °· Abdominal bloating. °· Constipation. °· Lack of passing gas. °· Frequent belching. °· Diarrhea. This may occur if the obstruction is partial and runny stool is able to leak around the obstruction. ° °How is this diagnosed? °This condition may be diagnosed based on a physical exam, medical history, and X-rays of the abdomen. You may also have other tests, such as a CT scan of the abdomen and pelvis. °How is this treated? °Treatment for this  condition depends on the cause and severity of the problem. Treatment options may include: °· Bed rest along with fluids and pain medicines that are given through an IV tube inserted into one of your veins. Sometimes, this is all that is needed for the obstruction to improve. °· Following a simple diet. In some cases, a clear liquid diet may be required for several days. This allows the bowel to rest. °· Placement of a small tube (nasogastric tube) into the stomach. When the bowel is blocked, it usually swells up like a balloon that is filled with air and fluids. The air and fluids may be removed by suction through the nasogastric tube. This can help with pain, discomfort, and nausea. It can also help the obstruction to clear up faster. °· Surgery. This may be required if other treatments do not work. Bowel obstruction from a hernia may require early surgery and can be an emergency procedure. Surgery may also be required for scar tissue that causes frequent or severe obstructions. ° °Follow these instructions at home: °· Get plenty of rest. °· Follow instructions from your health care provider about eating restrictions. You may need to avoid solid foods and consume only clear liquids until your condition improves. °· Take over-the-counter and prescription medicines only as told by your health care provider. °· Keep all follow-up visits as told by your health care provider. This is important. °Contact a health care provider if: °· You have a fever. °· You have chills. °Get help right away if: °·   You have increased pain or cramping. °· You vomit blood. °· You have uncontrolled vomiting or nausea. °· You cannot drink fluids because of vomiting or pain. °· You develop confusion. °· You begin feeling very dry or thirsty (dehydrated). °· You have severe bloating. °· You feel extremely weak or you faint. °This information is not intended to replace advice given to you by your health care provider. Make sure you discuss any  questions you have with your health care provider. °Document Released: 11/13/2005 Document Revised: 04/23/2016 Document Reviewed: 10/21/2014 °Elsevier Interactive Patient Education © 2018 [employer] ° °

## 2018-06-19 ENCOUNTER — Encounter (INDEPENDENT_AMBULATORY_CARE_PROVIDER_SITE_OTHER): Payer: Self-pay | Admitting: Internal Medicine

## 2018-06-19 ENCOUNTER — Ambulatory Visit (INDEPENDENT_AMBULATORY_CARE_PROVIDER_SITE_OTHER): Payer: Managed Care, Other (non HMO) | Admitting: Internal Medicine

## 2018-06-19 ENCOUNTER — Encounter (INDEPENDENT_AMBULATORY_CARE_PROVIDER_SITE_OTHER): Payer: Self-pay | Admitting: *Deleted

## 2018-06-19 VITALS — BP 160/80 | HR 60 | Temp 98.6°F | Ht 65.0 in | Wt 172.4 lb

## 2018-06-19 DIAGNOSIS — K219 Gastro-esophageal reflux disease without esophagitis: Secondary | ICD-10-CM | POA: Diagnosis not present

## 2018-06-19 MED ORDER — PANTOPRAZOLE SODIUM 40 MG PO TBEC: 40.0000 mg | DELAYED_RELEASE_TABLET | Freq: Every day | ORAL | 3 refills | Status: DC

## 2018-06-19 NOTE — Patient Instructions (Signed)
Gastroesophageal Reflux Disease, Adult Normally, food travels down the esophagus and stays in the stomach to be digested. However, when a person has gastroesophageal reflux disease (GERD), food and stomach acid move back up into the esophagus. When this happens, the esophagus becomes sore and inflamed. Over time, GERD can create small holes (ulcers) in the lining of the esophagus. What are the causes? This condition is caused by a problem with the muscle between the esophagus and the stomach (lower esophageal sphincter, or LES). Normally, the LES muscle closes after food passes through the esophagus to the stomach. When the LES is weakened or abnormal, it does not close properly, and that allows food and stomach acid to go back up into the esophagus. The LES can be weakened by certain dietary substances, medicines, and medical conditions, including:  Tobacco use.  Pregnancy.  Having a hiatal hernia.  Heavy alcohol use.  Certain foods and beverages, such as coffee, chocolate, onions, and peppermint.  What increases the risk? This condition is more likely to develop in:  People who have an increased body weight.  People who have connective tissue disorders.  People who use NSAID medicines.  What are the signs or symptoms? Symptoms of this condition include:  Heartburn.  Difficult or painful swallowing.  The feeling of having a lump in the throat.  Abitter taste in the mouth.  Bad breath.  Having a large amount of saliva.  Having an upset or bloated stomach.  Belching.  Chest pain.  Shortness of breath or wheezing.  Ongoing (chronic) cough or a night-time cough.  Wearing away of tooth enamel.  Weight loss.  Different conditions can cause chest pain. Make sure to see your health care provider if you experience chest pain. How is this diagnosed? Your health care provider will take a medical history and perform a physical exam. To determine if you have mild or severe  GERD, your health care provider may also monitor how you respond to treatment. You may also have other tests, including:  An endoscopy toexamine your stomach and esophagus with a small camera.  A test thatmeasures the acidity level in your esophagus.  A test thatmeasures how much pressure is on your esophagus.  A barium swallow or modified barium swallow to show the shape, size, and functioning of your esophagus.  How is this treated? The goal of treatment is to help relieve your symptoms and to prevent complications. Treatment for this condition may vary depending on how severe your symptoms are. Your health care provider may recommend:  Changes to your diet.  Medicine.  Surgery.  Follow these instructions at home: Diet  Follow a diet as recommended by your health care provider. This may involve avoiding foods and drinks such as: ? Coffee and tea (with or without caffeine). ? Drinks that containalcohol. ? Energy drinks and sports drinks. ? Carbonated drinks or sodas. ? Chocolate and cocoa. ? Peppermint and mint flavorings. ? Garlic and onions. ? Horseradish. ? Spicy and acidic foods, including peppers, chili powder, curry powder, vinegar, hot sauces, and barbecue sauce. ? Citrus fruit juices and citrus fruits, such as oranges, lemons, and limes. ? Tomato-based foods, such as red sauce, chili, salsa, and pizza with red sauce. ? Fried and fatty foods, such as donuts, french fries, potato chips, and high-fat dressings. ? High-fat meats, such as hot dogs and fatty cuts of red and white meats, such as rib eye steak, sausage, ham, and bacon. ? High-fat dairy items, such as whole milk,   butter, and cream cheese.  Eat small, frequent meals instead of large meals.  Avoid drinking large amounts of liquid with your meals.  Avoid eating meals during the 2-3 hours before bedtime.  Avoid lying down right after you eat.  Do not exercise right after you eat. General  instructions  Pay attention to any changes in your symptoms.  Take over-the-counter and prescription medicines only as told by your health care provider. Do not take aspirin, ibuprofen, or other NSAIDs unless your health care provider told you to do so.  Do not use any tobacco products, including cigarettes, chewing tobacco, and e-cigarettes. If you need help quitting, ask your health care provider.  Wear loose-fitting clothing. Do not wear anything tight around your waist that causes pressure on your abdomen.  Raise (elevate) the head of your bed 6 inches (15cm).  Try to reduce your stress, such as with yoga or meditation. If you need help reducing stress, ask your health care provider.  If you are overweight, reduce your weight to an amount that is healthy for you. Ask your health care provider for guidance about a safe weight loss goal.  Keep all follow-up visits as told by your health care provider. This is important. Contact a health care provider if:  You have new symptoms.  You have unexplained weight loss.  You have difficulty swallowing, or it hurts to swallow.  You have wheezing or a persistent cough.  Your symptoms do not improve with treatment.  You have a hoarse voice. Get help right away if:  You have pain in your arms, neck, jaw, teeth, or back.  You feel sweaty, dizzy, or light-headed.  You have chest pain or shortness of breath.  You vomit and your vomit looks like blood or coffee grounds.  You faint.  Your stool is bloody or black.  You cannot swallow, drink, or eat. This information is not intended to replace advice given to you by your health care provider. Make sure you discuss any questions you have with your health care provider. Document Released: 06/06/2005 Document Revised: 01/25/2016 Document Reviewed: 12/22/2014 Elsevier Interactive Patient Education  2018 [employer] Inc[employer]  

## 2018-06-19 NOTE — Progress Notes (Signed)
Subjective:    Patient ID: [name] Boyle, male    DOB: [birth year], 67 y.o.   MRN: [id number]  HPI Referred by Dr. Selena Batten for GERD. Has been taking Zantac and Prevacid with some symptom relief. Also dry cough when he lies down.  He states he has a cough. Cannot clear his throat.  He says ENT diagnosed him with GERD around June of 2018.  Has been taking Zantac and Prevacid since 2018. He doesn't eat fried foods. He avoid spicy foods. He does eat spicy foods. His appetite is okay. No weight loss.  BMs are moving okay. He says he has had GERD for about 18 months.  He would like to proceed with an EGD.   Hx of diabetes, hypertension    Review of Systems Past Medical History:  Diagnosis Date  . Diabetes mellitus without complication (HCC)   . GERD (gastroesophageal reflux disease)   . High cholesterol   . Hypertension     Past Surgical History:  Procedure Laterality Date  . ABDOMINAL SURGERY    . KNEE SURGERY      No Known Allergies  Current Outpatient Medications on File Prior to Visit  Medication Sig Dispense Refill  . amLODipine (NORVASC) 10 MG tablet Take 10 mg by mouth daily.    . Ascorbic Acid (VITAMIN C) 1000 MG tablet Take 1,000 mg by mouth daily.    Marland Kitchen aspirin 81 MG chewable tablet Chew by mouth daily.    Marland Kitchen atorvastatin (LIPITOR) 40 MG tablet Take 40 mg by mouth daily.    Marland Kitchen azelastine (ASTELIN) 0.1 % nasal spray Place into both nostrils 2 (two) times daily. Use in each nostril as directed    . Cholecalciferol (VITAMIN D3) 5000 units CAPS Take 1 capsule by mouth daily.     Marland Kitchen co-enzyme Q-10 30 MG capsule Take 300 mg by mouth daily.    Marland Kitchen docusate sodium (COLACE) 50 MG capsule Take 50 mg by mouth 2 (two) times daily.    . enalapril (VASOTEC) 10 MG tablet Take 10 mg by mouth 2 (two) times daily.    . fexofenadine (HM FEXOFENADINE HCL) 180 MG tablet Take 180 mg by mouth daily.    . fluticasone (FLONASE) 50 MCG/ACT nasal spray Place into both nostrils daily.    Marland Kitchen  GLUCOS-CHONDROIT-MSM-C-HYAL PO Take by mouth.    . lansoprazole (PREVACID) 30 MG capsule Take 30 mg by mouth daily at 12 noon.    . Multiple Vitamin (MULTIVITAMIN) capsule Take 1 capsule by mouth daily.    . Naproxen-Esomeprazole (VIMOVO) 500-20 MG TBEC Take by mouth as needed.    . Omega-3 Fatty Acids (FISH OIL PO) Take 1,400 mg by mouth.    . ranitidine (ZANTAC) 150 MG tablet Take 150 mg by mouth 2 (two) times daily.    . sitaGLIPtin (JANUVIA) 100 MG tablet Take 100 mg by mouth daily.    Marland Kitchen Ustekinumab (STELARA) 45 MG/0.5ML SOLN Inject into the skin.     No current facility-administered medications on file prior to visit.         Objective:   Physical Exam Blood pressure (!) 160/80, pulse 60, temperature 98.6 F (37 C), height 5\' 5"  (1.651 m), weight 172 lb 6.4 oz (78.2 kg). Alert and oriented. Skin warm and dry. Oral mucosa is moist.   . Sclera anicteric, conjunctivae is pink. Thyroid not enlarged. No cervical lymphadenopathy. Lungs clear. Heart regular rate and rhythm.  Abdomen is soft. Bowel sounds are positive. No hepatomegaly. No abdominal  masses felt. No tenderness.  No edema to lower extremities.           Assessment & Plan:  GERD. EGD. Rx for Protonix sent to his pharmacy. May take Zantac at night. GERD diet given to patient

## 2018-06-20 DIAGNOSIS — K219 Gastro-esophageal reflux disease without esophagitis: Secondary | ICD-10-CM | POA: Insufficient documentation

## 2018-07-07 ENCOUNTER — Inpatient Hospital Stay (HOSPITAL_COMMUNITY)
Admission: EM | Admit: 2018-07-07 | Discharge: 2018-07-12 | DRG: 389 | Disposition: A | Discharge status: Home / Self Care | Payer: Medicare Other | Attending: Internal Medicine | Admitting: Internal Medicine

## 2018-07-07 ENCOUNTER — Encounter (HOSPITAL_COMMUNITY): Payer: Self-pay | Admitting: Emergency Medicine

## 2018-07-07 ENCOUNTER — Emergency Department (HOSPITAL_COMMUNITY): Payer: Medicare Other

## 2018-07-07 ENCOUNTER — Other Ambulatory Visit: Payer: Self-pay

## 2018-07-07 DIAGNOSIS — K5651 Intestinal adhesions [bands], with partial obstruction: Principal | ICD-10-CM | POA: Diagnosis present

## 2018-07-07 DIAGNOSIS — E785 Hyperlipidemia, unspecified: Secondary | ICD-10-CM | POA: Diagnosis present

## 2018-07-07 DIAGNOSIS — Z7951 Long term (current) use of inhaled steroids: Secondary | ICD-10-CM

## 2018-07-07 DIAGNOSIS — K56609 Unspecified intestinal obstruction, unspecified as to partial versus complete obstruction: Secondary | ICD-10-CM | POA: Diagnosis not present

## 2018-07-07 DIAGNOSIS — K7689 Other specified diseases of liver: Secondary | ICD-10-CM | POA: Diagnosis present

## 2018-07-07 DIAGNOSIS — I10 Essential (primary) hypertension: Secondary | ICD-10-CM | POA: Diagnosis present

## 2018-07-07 DIAGNOSIS — I700 Atherosclerosis of aorta: Secondary | ICD-10-CM | POA: Diagnosis present

## 2018-07-07 DIAGNOSIS — N281 Cyst of kidney, acquired: Secondary | ICD-10-CM | POA: Diagnosis present

## 2018-07-07 DIAGNOSIS — E119 Type 2 diabetes mellitus without complications: Secondary | ICD-10-CM | POA: Diagnosis present

## 2018-07-07 DIAGNOSIS — R188 Other ascites: Secondary | ICD-10-CM | POA: Diagnosis present

## 2018-07-07 DIAGNOSIS — Z7982 Long term (current) use of aspirin: Secondary | ICD-10-CM

## 2018-07-07 DIAGNOSIS — E7800 Pure hypercholesterolemia, unspecified: Secondary | ICD-10-CM | POA: Diagnosis present

## 2018-07-07 DIAGNOSIS — Z7984 Long term (current) use of oral hypoglycemic drugs: Secondary | ICD-10-CM

## 2018-07-07 DIAGNOSIS — Z833 Family history of diabetes mellitus: Secondary | ICD-10-CM

## 2018-07-07 DIAGNOSIS — K219 Gastro-esophageal reflux disease without esophagitis: Secondary | ICD-10-CM | POA: Diagnosis present

## 2018-07-07 DIAGNOSIS — I2510 Atherosclerotic heart disease of native coronary artery without angina pectoris: Secondary | ICD-10-CM | POA: Diagnosis present

## 2018-07-07 LAB — CBC WITH DIFFERENTIAL/PLATELET
Abs Immature Granulocytes: 0.03 10*3/uL (ref 0.00–0.07)
BASOS PCT: 0 %
Basophils Absolute: 0 10*3/uL (ref 0.0–0.1)
EOS ABS: 0 10*3/uL (ref 0.0–0.5)
EOS PCT: 0 %
HEMATOCRIT: 43.7 % (ref 39.0–52.0)
Hemoglobin: 14 g/dL (ref 13.0–17.0)
Immature Granulocytes: 0 %
LYMPHS ABS: 0.7 10*3/uL (ref 0.7–4.0)
Lymphocytes Relative: 8 %
MCH: 30.1 pg (ref 26.0–34.0)
MCHC: 32 g/dL (ref 30.0–36.0)
MCV: 94 fL (ref 80.0–100.0)
MONO ABS: 0.3 10*3/uL (ref 0.1–1.0)
MONOS PCT: 3 %
NEUTROS PCT: 89 %
Neutro Abs: 8.7 10*3/uL — ABNORMAL HIGH (ref 1.7–7.7)
PLATELETS: 259 10*3/uL (ref 150–400)
RBC: 4.65 MIL/uL (ref 4.22–5.81)
RDW: 13.8 % (ref 11.5–15.5)
WBC: 9.9 10*3/uL (ref 4.0–10.5)
nRBC: 0 % (ref 0.0–0.2)

## 2018-07-07 MED ORDER — IOPAMIDOL (ISOVUE-300) INJECTION 61%
100.0000 mL | Freq: Once | INTRAVENOUS | Status: AC | PRN
  Administered 2018-07-08: 100 mL via INTRAVENOUS

## 2018-07-07 MED ORDER — ONDANSETRON HCL 4 MG/2ML IJ SOLN
4.0000 mg | Freq: Once | INTRAMUSCULAR | Status: AC
  Administered 2018-07-07: 4 mg via INTRAVENOUS
  Filled 2018-07-07: qty 2

## 2018-07-07 MED ORDER — MORPHINE SULFATE (PF) 4 MG/ML IV SOLN
4.0000 mg | Freq: Once | INTRAVENOUS | Status: AC
  Administered 2018-07-07: 4 mg via INTRAVENOUS
  Filled 2018-07-07: qty 1

## 2018-07-07 NOTE — ED Triage Notes (Signed)
Pt c/o abd pain with one episode of vomiting since 1630 today, pt states he has been seen here for same and was told CT showed scar tissue

## 2018-07-07 NOTE — ED Provider Notes (Signed)
[facility] EMERGENCY DEPARTMENT Provider Note   CSN: [id number] Arrival date & time: 07/07/18  2128     History   Chief Complaint Chief Complaint  Patient presents with  . Abdominal Pain    HPI [name] Boyle is a 67 y.o. male.  HPI  This is a 67 year old male with history of diabetes, hypertension, high cholesterol, small bowel obstruction who presents with abdominal pain.  Patient reports worsening abdominal pain throughout the afternoon.  He rates his pain at 10 out of 10.  He has had 2 episodes of nonbilious, nonbloody emesis.  He does report that he had a bowel movement this morning which was loose in consistency.  He states his pain is consistent with prior obstructions.  Patient has not taken anything for his pain.  He denies any fever, chest pain, shortness of breath, urinary symptoms.  I have reviewed the patient's chart.  He was admitted in October for similar symptoms and a partial SBO which self resolved.  Past Medical History:  Diagnosis Date  . Diabetes mellitus without complication (HCC)   . GERD (gastroesophageal reflux disease)   . High cholesterol   . Hypertension     Patient Active Problem List   Diagnosis Date Noted  . SBO (small bowel obstruction) (HCC) 07/08/2018  . Gastroesophageal reflux disease without esophagitis 06/20/2018  . Small bowel obstruction (HCC) 06/09/2018    Past Surgical History:  Procedure Laterality Date  . ABDOMINAL SURGERY    . KNEE SURGERY          Home Medications    Prior to Admission medications   Medication Sig Start Date End Date Taking? Authorizing Provider  metFORMIN (GLUCOPHAGE) 500 MG tablet Take 500 mg by mouth daily with breakfast.   Yes [provider]  amLODipine (NORVASC) 10 MG tablet Take 10 mg by mouth daily.    [provider]  Ascorbic Acid (VITAMIN C) 1000 MG tablet Take 1,000 mg by mouth daily.    [provider]  aspirin 81 MG chewable tablet Chew by mouth daily.     [provider]  atorvastatin (LIPITOR) 40 MG tablet Take 40 mg by mouth daily.    [provider]  azelastine (ASTELIN) 0.1 % nasal spray Place into both nostrils 2 (two) times daily. Use in each nostril as directed    [provider]  Cholecalciferol (VITAMIN D3) 5000 units CAPS Take 1 capsule by mouth daily.     [provider]  co-enzyme Q-10 30 MG capsule Take 300 mg by mouth daily.    [provider]  docusate sodium (COLACE) 50 MG capsule Take 50 mg by mouth 2 (two) times daily.    [provider]  enalapril (VASOTEC) 10 MG tablet Take 10 mg by mouth 2 (two) times daily.    [provider]  fexofenadine (HM FEXOFENADINE HCL) 180 MG tablet Take 180 mg by mouth daily.    [provider]  fluticasone (FLONASE) 50 MCG/ACT nasal spray Place into both nostrils daily.    [provider]  GLUCOS-CHONDROIT-MSM-C-HYAL PO Take by mouth.    [provider]  lansoprazole (PREVACID) 30 MG capsule Take 30 mg by mouth daily at 12 noon.    [provider]  Multiple Vitamin (MULTIVITAMIN) capsule Take 1 capsule by mouth daily.    [provider]  Naproxen-Esomeprazole (VIMOVO) 500-20 MG TBEC Take by mouth as needed.    [provider]  Omega-3 Fatty Acids (FISH OIL PO) Take 1,400  mg by mouth.    [provider]  pantoprazole (PROTONIX) 40 MG tablet Take 1 tablet (40 mg total) by mouth daily. 06/19/18   Setzer, Brand Males, NP  ranitidine (ZANTAC) 150 MG tablet Take 150 mg by mouth 2 (two) times daily.    [provider]  sitaGLIPtin (JANUVIA) 100 MG tablet Take 100 mg by mouth daily.    [provider]  Ustekinumab (STELARA) 45 MG/0.5ML SOLN Inject into the skin.    [provider]    Family History History reviewed. No pertinent family history.  Social History Social History   Tobacco Use  . Smoking status: Never Smoker  . Smokeless tobacco: Never Used   Substance Use Topics  . Alcohol use: Yes    Frequency: Never    Comment: occasional  . Drug use: Not on file     Allergies   Patient has no known allergies.   Review of Systems Review of Systems  Constitutional: Negative for fever.  Respiratory: Negative for chest tightness and shortness of breath.   Cardiovascular: Negative for chest pain.  Gastrointestinal: Positive for abdominal pain, nausea and vomiting. Negative for constipation and diarrhea.  Genitourinary: Negative for dysuria.  All other systems reviewed and are negative.    Physical Exam Updated Vital Signs BP 138/81   Pulse 66   Temp 98.2 F (36.8 C) (Oral)   Resp 18   Ht 1.651 m (5\' 5" )   Wt 75.8 kg   SpO2 95%   BMI 27.79 kg/m   Physical Exam  Constitutional: He is oriented to person, place, and time.  Uncomfortable appearing but nontoxic  HENT:  Head: Normocephalic and atraumatic.  Eyes: Pupils are equal, round, and reactive to light.  Neck: Neck supple.  Cardiovascular: Normal rate, regular rhythm and normal heart sounds.  No murmur heard. Pulmonary/Chest: Effort normal and breath sounds normal. No respiratory distress. He has no wheezes.  Abdominal: Soft. He exhibits distension. Bowel sounds are increased. There is generalized tenderness. There is guarding. There is no rebound.  Hyperactive bowel sounds, distention of the abdomen  Musculoskeletal: He exhibits no edema.  Lymphadenopathy:    He has no cervical adenopathy.  Neurological: He is alert and oriented to person, place, and time.  Skin: Skin is warm and dry.  Psychiatric: He has a normal mood and affect.  Nursing note and vitals reviewed.    ED Treatments / Results  Labs (all labs ordered are listed, but only abnormal results are displayed) Labs Reviewed  CBC WITH DIFFERENTIAL/PLATELET - Abnormal; Notable for the following components:      Result Value   Neutro Abs 8.7 (*)    All other components within normal limits    COMPREHENSIVE METABOLIC PANEL - Abnormal; Notable for the following components:   Glucose, Bld 131 (*)    Total Protein 8.4 (*)    All other components within normal limits  LIPASE, BLOOD - Abnormal; Notable for the following components:   Lipase 62 (*)    All other components within normal limits    EKG None  Radiology Ct Abdomen Pelvis W Contrast  Result Date: 07/08/2018 CLINICAL DATA:  Abdominal pain with episode of vomiting today. EXAM: CT ABDOMEN AND PELVIS WITH CONTRAST TECHNIQUE: Multidetector CT imaging of the abdomen and pelvis was performed using the standard protocol following bolus administration of intravenous contrast. CONTRAST:  ISOVUE-300 IOPAMIDOL (ISOVUE-300) INJECTION 61% COMPARISON:  06/09/2018 FINDINGS: Lower chest: Lung bases demonstrate mild bibasilar atelectatic change. Calcified plaque over  the right coronary artery. Mild cardiomegaly. Hepatobiliary: Stable 1 cm cyst over the left lobe of the liver. Remainder of the liver, gallbladder and biliary tree are normal. Pancreas: Normal. Spleen: Normal. Adrenals/Urinary Tract: Adrenal glands are normal. Kidneys are normal in size without hydronephrosis or nephrolithiasis. Several bilateral renal cysts are present. Indeterminate 1.7 cm hypodensity over the mid pole right kidney unchanged and likely a hyperdense cyst. Ureters and bladder are normal. Stomach/Bowel: Stomach is within normal. There are multiple air and fluid-filled dilated small bowel loops measuring up to 4.9 cm in diameter. Transition point may be over the anterior mid abdomen possibly due to adhesions. Appendix is normal. Colon is somewhat decompressed. Vascular/Lymphatic: Moderate calcified plaque over the abdominal aorta. No adenopathy. Reproductive: Normal. Other: Mild patchy free fluid in the abdomen and pelvis. No free peritoneal air. Musculoskeletal: Degenerative change of the spine and hips. IMPRESSION: Evidence of mid to distal small bowel obstruction  with transition point likely within the anterior mid abdomen due to adhesions. Mild patchy ascites. Bilateral renal cysts. Indeterminate 1.7 cm right renal cortical hypodensity unchanged and likely a hyperdense cyst. Consider follow-up MRI with and without contrast in 6 months. Stable 1 cm liver cyst. Aortic Atherosclerosis (ICD10-I70.0). Mild cardiomegaly.  Minimal atherosclerotic coronary artery disease. Electronically Signed   By: Elberta Fortis M.D.   On: 07/08/2018 02:03    Procedures Procedures (including critical care time)  Medications Ordered in ED Medications  enoxaparin (LOVENOX) injection 40 mg (has no administration in time range)  0.9 %  sodium chloride infusion (has no administration in time range)  acetaminophen (TYLENOL) tablet 650 mg (has no administration in time range)    Or  acetaminophen (TYLENOL) suppository 650 mg (has no administration in time range)  ondansetron (ZOFRAN) injection 4 mg (has no administration in time range)  insulin aspart (novoLOG) injection 0-9 Units (has no administration in time range)  aspirin chewable tablet 81 mg (has no administration in time range)  enalaprilat (VASOTEC) injection 1.25 mg (has no administration in time range)  hydrALAZINE (APRESOLINE) injection 10 mg (has no administration in time range)  famotidine (PEPCID) IVPB 20 mg premix (has no administration in time range)  azelastine (ASTELIN) 0.1 % nasal spray 1 spray (has no administration in time range)  fluticasone (FLONASE) 50 MCG/ACT nasal spray 2 spray (has no administration in time range)  morphine 4 MG/ML injection 4 mg (4 mg Intravenous Given 07/07/18 2346)  ondansetron (ZOFRAN) injection 4 mg (4 mg Intravenous Given 07/07/18 2346)  iopamidol (ISOVUE-300) 61 % injection 100 mL (100 mLs Intravenous Contrast Given 07/08/18 0022)     Initial Impression / Assessment and Plan / ED Course  I have reviewed the triage vital signs and the nursing notes.  Pertinent labs & imaging  results that were available during my care of the patient were reviewed by me and considered in my medical decision making (see chart for details).     Patient presents with abdominal pain consistent with his prior SBO.  He is uncomfortable appearing but nontoxic.  Hyperactive bowel sounds with diffuse tenderness.  Vital signs are reassuring.  He was made n.p.o.  Patient was given pain and nausea medication.  Lab work-up is largely reassuring.  CT scan does show recurrent SBO with a transition point.  Patient was discussed with Dr. Lovell Sheehan.  He will consult later today.  He request hospitalist admission.  NG tube was ordered.  Patient was discussed with Dr. Selena Batten, hospitalist.  Final Clinical Impressions(s) / ED Diagnoses  Final diagnoses:  SBO (small bowel obstruction) [facility])    ED Discharge Orders    None       Wilkie Aye, Mayer Masker, MD 07/08/18 [phone]

## 2018-07-08 ENCOUNTER — Other Ambulatory Visit: Payer: Self-pay

## 2018-07-08 ENCOUNTER — Observation Stay (HOSPITAL_COMMUNITY): Payer: Medicare Other

## 2018-07-08 ENCOUNTER — Encounter (HOSPITAL_COMMUNITY): Payer: Self-pay | Admitting: Internal Medicine

## 2018-07-08 DIAGNOSIS — Z833 Family history of diabetes mellitus: Secondary | ICD-10-CM | POA: Diagnosis not present

## 2018-07-08 DIAGNOSIS — K56609 Unspecified intestinal obstruction, unspecified as to partial versus complete obstruction: Secondary | ICD-10-CM | POA: Diagnosis present

## 2018-07-08 DIAGNOSIS — E119 Type 2 diabetes mellitus without complications: Secondary | ICD-10-CM | POA: Diagnosis present

## 2018-07-08 DIAGNOSIS — R188 Other ascites: Secondary | ICD-10-CM | POA: Diagnosis present

## 2018-07-08 DIAGNOSIS — I700 Atherosclerosis of aorta: Secondary | ICD-10-CM | POA: Diagnosis present

## 2018-07-08 DIAGNOSIS — I2510 Atherosclerotic heart disease of native coronary artery without angina pectoris: Secondary | ICD-10-CM | POA: Diagnosis present

## 2018-07-08 DIAGNOSIS — I10 Essential (primary) hypertension: Secondary | ICD-10-CM | POA: Diagnosis present

## 2018-07-08 DIAGNOSIS — E7800 Pure hypercholesterolemia, unspecified: Secondary | ICD-10-CM | POA: Diagnosis present

## 2018-07-08 DIAGNOSIS — K219 Gastro-esophageal reflux disease without esophagitis: Secondary | ICD-10-CM | POA: Diagnosis present

## 2018-07-08 DIAGNOSIS — Z7984 Long term (current) use of oral hypoglycemic drugs: Secondary | ICD-10-CM | POA: Diagnosis not present

## 2018-07-08 DIAGNOSIS — N281 Cyst of kidney, acquired: Secondary | ICD-10-CM | POA: Diagnosis present

## 2018-07-08 DIAGNOSIS — Z7982 Long term (current) use of aspirin: Secondary | ICD-10-CM | POA: Diagnosis not present

## 2018-07-08 DIAGNOSIS — Z7951 Long term (current) use of inhaled steroids: Secondary | ICD-10-CM | POA: Diagnosis not present

## 2018-07-08 DIAGNOSIS — K5651 Intestinal adhesions [bands], with partial obstruction: Secondary | ICD-10-CM | POA: Diagnosis present

## 2018-07-08 DIAGNOSIS — K7689 Other specified diseases of liver: Secondary | ICD-10-CM | POA: Diagnosis present

## 2018-07-08 DIAGNOSIS — E785 Hyperlipidemia, unspecified: Secondary | ICD-10-CM | POA: Diagnosis present

## 2018-07-08 LAB — COMPREHENSIVE METABOLIC PANEL
ALK PHOS: 50 U/L (ref 38–126)
ALT: 22 U/L (ref 0–44)
ANION GAP: 11 (ref 5–15)
AST: 27 U/L (ref 15–41)
Albumin: 4.8 g/dL (ref 3.5–5.0)
BUN: 8 mg/dL (ref 8–23)
CALCIUM: 9.8 mg/dL (ref 8.9–10.3)
CHLORIDE: 104 mmol/L (ref 98–111)
CO2: 23 mmol/L (ref 22–32)
Creatinine, Ser: 1.1 mg/dL (ref 0.61–1.24)
GFR calc non Af Amer: >60 mL/min (ref >60)
Glucose, Bld: 131 mg/dL — ABNORMAL HIGH (ref 70–99)
Potassium: 3.6 mmol/L (ref 3.5–5.1)
SODIUM: 138 mmol/L (ref 135–145)
Total Bilirubin: 1 mg/dL (ref 0.3–1.2)
Total Protein: 8.4 g/dL — ABNORMAL HIGH (ref 6.5–8.1)

## 2018-07-08 LAB — CBG MONITORING, ED
GLUCOSE-CAPILLARY: 120 mg/dL — AB (ref 70–99)
GLUCOSE-CAPILLARY: 86 mg/dL (ref 70–99)
Glucose-Capillary: 100 mg/dL — ABNORMAL HIGH (ref 70–99)
Glucose-Capillary: 96 mg/dL (ref 70–99)

## 2018-07-08 LAB — GLUCOSE, CAPILLARY
GLUCOSE-CAPILLARY: 82 mg/dL (ref 70–99)
Glucose-Capillary: 76 mg/dL (ref 70–99)
Glucose-Capillary: 87 mg/dL (ref 70–99)

## 2018-07-08 LAB — LIPASE, BLOOD: Lipase: 62 U/L — ABNORMAL HIGH (ref 11–51)

## 2018-07-08 MED ORDER — SODIUM CHLORIDE 0.9 % IV SOLN
INTRAVENOUS | Status: AC
  Administered 2018-07-08 (×2): via INTRAVENOUS

## 2018-07-08 MED ORDER — ONDANSETRON HCL 4 MG/2ML IJ SOLN
4.0000 mg | Freq: Four times a day (QID) | INTRAMUSCULAR | Status: DC | PRN
  Administered 2018-07-08: 4 mg via INTRAVENOUS
  Filled 2018-07-08: qty 2

## 2018-07-08 MED ORDER — MORPHINE SULFATE (PF) 2 MG/ML IV SOLN
1.0000 mg | INTRAVENOUS | Status: DC | PRN
  Administered 2018-07-08: 1 mg via INTRAVENOUS

## 2018-07-08 MED ORDER — ASPIRIN 81 MG PO CHEW
81.0000 mg | CHEWABLE_TABLET | Freq: Every day | ORAL | Status: DC
  Administered 2018-07-09: 81 mg via ORAL
  Filled 2018-07-08: qty 1

## 2018-07-08 MED ORDER — ENOXAPARIN SODIUM 40 MG/0.4ML ~~LOC~~ SOLN
40.0000 mg | SUBCUTANEOUS | Status: DC
  Administered 2018-07-08 – 2018-07-10 (×3): 40 mg via SUBCUTANEOUS
  Filled 2018-07-08 (×3): qty 0.4

## 2018-07-08 MED ORDER — MORPHINE SULFATE (PF) 4 MG/ML IV SOLN
INTRAVENOUS | Status: AC
  Filled 2018-07-08: qty 1

## 2018-07-08 MED ORDER — ENALAPRILAT 1.25 MG/ML IV SOLN
1.2500 mg | Freq: Four times a day (QID) | INTRAVENOUS | Status: DC
  Administered 2018-07-08 – 2018-07-12 (×17): 1.25 mg via INTRAVENOUS
  Filled 2018-07-08 (×17): qty 2

## 2018-07-08 MED ORDER — INSULIN ASPART 100 UNIT/ML ~~LOC~~ SOLN: 0.0000 [IU] | SUBCUTANEOUS | Status: DC

## 2018-07-08 MED ORDER — SODIUM CHLORIDE 0.9 % IV SOLN
INTRAVENOUS | Status: DC | PRN
  Administered 2018-07-08: 1000 mL via INTRAVENOUS

## 2018-07-08 MED ORDER — FLUTICASONE PROPIONATE 50 MCG/ACT NA SUSP
2.0000 | Freq: Every day | NASAL | Status: DC
  Administered 2018-07-11 – 2018-07-12 (×2): 2 via NASAL
  Filled 2018-07-08 (×3): qty 16

## 2018-07-08 MED ORDER — ACETAMINOPHEN 325 MG PO TABS: 650.0000 mg | ORAL_TABLET | Freq: Four times a day (QID) | ORAL | Status: DC | PRN

## 2018-07-08 MED ORDER — FAMOTIDINE IN NACL 20-0.9 MG/50ML-% IV SOLN
20.0000 mg | Freq: Two times a day (BID) | INTRAVENOUS | Status: DC
  Administered 2018-07-08 – 2018-07-09 (×4): 20 mg via INTRAVENOUS
  Filled 2018-07-08 (×4): qty 50

## 2018-07-08 MED ORDER — HYDRALAZINE HCL 20 MG/ML IJ SOLN: 10.0000 mg | Freq: Four times a day (QID) | INTRAMUSCULAR | Status: DC | PRN

## 2018-07-08 MED ORDER — ACETAMINOPHEN 650 MG RE SUPP: 650.0000 mg | Freq: Four times a day (QID) | RECTAL | Status: DC | PRN

## 2018-07-08 MED ORDER — AZELASTINE HCL 0.1 % NA SOLN
1.0000 | Freq: Two times a day (BID) | NASAL | Status: DC
  Administered 2018-07-08 – 2018-07-12 (×6): 1 via NASAL
  Filled 2018-07-08: qty 30

## 2018-07-08 NOTE — H&P (Addendum)
TRH H&P   Patient Demographics:    [name] Boyle, is a 67 y.o. male  MRN: [id number]   DOB - 1951-04-14  Admit Date - 07/07/2018  Outpatient Primary MD for the patient is Pearson Grippe, MD  Referring MD/NP/PA:   Ross Marcus  Outpatient Specialists:     Patient coming from: home  Chief Complaint  Patient presents with  . Abdominal Pain      HPI:    [name] Boyle  is a 67 y.o. male, w dm2, hypertension, hyperlipidemia, w / h/o prior SBO apparently presents with c/o n/v x2 (no blood), and centralized abdominal pain.  Pt denies fever, chills, diarrhea, brbpr, black stool. Pt presented to ED due to n/v, and found to have SBO due to adhesions  In ED,   T 98.2  P 66 Bp 138/81  Pox 95% on RA  Wbc 9.9, Hgb 14.0, Plt 259 Na 138, K 3.6,  Bun 8, Creatinine 1.10  Ast 27, Alt 22 Lipase 62  CT scan abd/ pelvis  IMPRESSION: Evidence of mid to distal small bowel obstruction with transition point likely within the anterior mid abdomen due to adhesions. Mild patchy ascites.  Bilateral renal cysts. Indeterminate 1.7 cm right renal cortical hypodensity unchanged and likely a hyperdense cyst. Consider follow-up MRI with and without contrast in 6 months.  Stable 1 cm liver cyst.  Aortic Atherosclerosis (ICD10-I70.0).  Mild cardiomegaly.  Minimal atherosclerotic coronary artery disease.  Pt will be admitted for SBO     Review of systems:    In addition to the HPI above,  No Fever-chills, No Headache, No changes with Vision or hearing, No problems swallowing food or Liquids, No Chest pain, Cough or Shortness of Breath,   No Blood in stool or Urine, No dysuria, No new skin rashes or bruises, No new joints pains-aches,  No new weakness, tingling, numbness in any extremity, No recent weight gain or loss, No polyuria, polydypsia or polyphagia, No significant Mental  Stressors.  A full 10 point Review of Systems was done, except as stated above, all other Review of Systems were negative.   With Past History of the following :    Past Medical History:  Diagnosis Date  . Diabetes mellitus without complication (HCC)   . GERD (gastroesophageal reflux disease)   . High cholesterol   . Hypertension       Past Surgical History:  Procedure Laterality Date  . ABDOMINAL SURGERY    . KNEE SURGERY        Social History:     Social History   Tobacco Use  . Smoking status: Never Smoker  . Smokeless tobacco: Never Used  Substance Use Topics  . Alcohol use: Yes    Frequency: Never    Comment: occasional     Lives - at home Mobility - walks by self   Family History :  Family History  Problem Relation Age of Onset  . Diabetes Father       Home Medications:   Prior to Admission medications   Medication Sig Start Date End Date Taking? Authorizing Provider  metFORMIN (GLUCOPHAGE) 500 MG tablet Take 500 mg by mouth daily with breakfast.   Yes [provider]  amLODipine (NORVASC) 10 MG tablet Take 10 mg by mouth daily.    [provider]  Ascorbic Acid (VITAMIN C) 1000 MG tablet Take 1,000 mg by mouth daily.    [provider]  aspirin 81 MG chewable tablet Chew by mouth daily.    [provider]  atorvastatin (LIPITOR) 40 MG tablet Take 40 mg by mouth daily.    [provider]  azelastine (ASTELIN) 0.1 % nasal spray Place into both nostrils 2 (two) times daily. Use in each nostril as directed    [provider]  Cholecalciferol (VITAMIN D3) 5000 units CAPS Take 1 capsule by mouth daily.     [provider]  co-enzyme Q-10 30 MG capsule Take 300 mg by mouth daily.    [provider]  docusate sodium (COLACE) 50 MG capsule Take 50 mg by mouth 2 (two) times daily.    [provider]  enalapril (VASOTEC) 10 MG tablet Take 10 mg by mouth 2 (two) times daily.     [provider]  fexofenadine (HM FEXOFENADINE HCL) 180 MG tablet Take 180 mg by mouth daily.    [provider]  fluticasone (FLONASE) 50 MCG/ACT nasal spray Place into both nostrils daily.    [provider]  GLUCOS-CHONDROIT-MSM-C-HYAL PO Take by mouth.    [provider]  lansoprazole (PREVACID) 30 MG capsule Take 30 mg by mouth daily at 12 noon.    [provider]  Multiple Vitamin (MULTIVITAMIN) capsule Take 1 capsule by mouth daily.    [provider]  Naproxen-Esomeprazole (VIMOVO) 500-20 MG TBEC Take by mouth as needed.    [provider]  Omega-3 Fatty Acids (FISH OIL PO) Take 1,400 mg by mouth.    [provider]  pantoprazole (PROTONIX) 40 MG tablet Take 1 tablet (40 mg total) by mouth daily. 06/19/18   Setzer, Brand Males, NP  ranitidine (ZANTAC) 150 MG tablet Take 150 mg by mouth 2 (two) times daily.    [provider]  sitaGLIPtin (JANUVIA) 100 MG tablet Take 100 mg by mouth daily.    [provider]  Ustekinumab (STELARA) 45 MG/0.5ML SOLN Inject into the skin.    [provider]     Allergies:    No Known Allergies   Physical Exam:   Vitals  Blood pressure 138/81, pulse 66, temperature 98.2 F (36.8 C), temperature source Oral, resp. rate 18, height 5\' 5"  (1.651 m), weight 75.8 kg, SpO2 95 %.   1. General  lying in bed in NAD,    2. Normal affect and insight, Not Suicidal or Homicidal, Awake Alert, Oriented X 3.  3. No F.N deficits, ALL C.Nerves Intact, Strength 5/5 all 4 extremities, Sensation intact all 4 extremities, Plantars down going.  4. Ears and Eyes appear Normal, Conjunctivae clear, PERRLA. Moist Oral Mucosa. NGT in place  5. Supple Neck, No JVD, No cervical lymphadenopathy appriciated, No Carotid Bruits.  6. Symmetrical Chest wall movement, Good air movement bilaterally, CTAB.  7. RRR, No Gallops, Rubs or Murmurs, No Parasternal Heave.  8. Positive Bowel  Sounds, Abdomen Soft, No tenderness, No organomegaly appriciated,No rebound -guarding or rigidity.  9.  No Cyanosis, Normal Skin Turgor, No Skin Rash or Bruise.  10. Good muscle tone,  joints appear normal , no effusions, Normal ROM.  11. No Palpable Lymph Nodes in Neck or Axillae    Data Review:    CBC Recent Labs  Lab 07/07/18 2334  WBC 9.9  HGB 14.0  HCT 43.7  PLT 259  MCV 94.0  MCH 30.1  MCHC 32.0  RDW 13.8  LYMPHSABS 0.7  MONOABS 0.3  EOSABS 0.0  BASOSABS 0.0   ------------------------------------------------------------------------------------------------------------------  Chemistries  Recent Labs  Lab 07/07/18 2334  NA 138  K 3.6  CL 104  CO2 23  GLUCOSE 131*  BUN 8  CREATININE 1.10  CALCIUM 9.8  AST 27  ALT 22  ALKPHOS 50  BILITOT 1.0   ------------------------------------------------------------------------------------------------------------------ estimated creatinine clearance is 61.1 mL/min (by C-G formula based on SCr of 1.1 mg/dL). ------------------------------------------------------------------------------------------------------------------ No results for input(s): TSH, T4TOTAL, T3FREE, THYROIDAB in the last 72 hours.  Invalid input(s): FREET3  Coagulation profile No results for input(s): INR, PROTIME in the last 168 hours. ------------------------------------------------------------------------------------------------------------------- No results for input(s): DDIMER in the last 72 hours. -------------------------------------------------------------------------------------------------------------------  Cardiac Enzymes No results for input(s): CKMB, TROPONINI, MYOGLOBIN in the last 168 hours.  Invalid input(s): CK ------------------------------------------------------------------------------------------------------------------ No results found for:  BNP   ---------------------------------------------------------------------------------------------------------------  Urinalysis No results found for: COLORURINE, APPEARANCEUR, LABSPEC, PHURINE, GLUCOSEU, HGBUR, BILIRUBINUR, KETONESUR, PROTEINUR, UROBILINOGEN, NITRITE, LEUKOCYTESUR  ----------------------------------------------------------------------------------------------------------------   Imaging Results:    Ct Abdomen Pelvis W Contrast  Result Date: 07/08/2018 CLINICAL DATA:  Abdominal pain with episode of vomiting today. EXAM: CT ABDOMEN AND PELVIS WITH CONTRAST TECHNIQUE: Multidetector CT imaging of the abdomen and pelvis was performed using the standard protocol following bolus administration of intravenous contrast. CONTRAST:  ISOVUE-300 IOPAMIDOL (ISOVUE-300) INJECTION 61% COMPARISON:  06/09/2018 FINDINGS: Lower chest: Lung bases demonstrate mild bibasilar atelectatic change. Calcified plaque over the right coronary artery. Mild cardiomegaly. Hepatobiliary: Stable 1 cm cyst over the left lobe of the liver. Remainder of the liver, gallbladder and biliary tree are normal. Pancreas: Normal. Spleen: Normal. Adrenals/Urinary Tract: Adrenal glands are normal. Kidneys are normal in size without hydronephrosis or nephrolithiasis. Several bilateral renal cysts are present. Indeterminate 1.7 cm hypodensity over the mid pole right kidney unchanged and likely a hyperdense cyst. Ureters and bladder are normal. Stomach/Bowel: Stomach is within normal. There are multiple air and fluid-filled dilated small bowel loops measuring up to 4.9 cm in diameter. Transition point may be over the anterior mid abdomen possibly due to adhesions. Appendix is normal. Colon is somewhat decompressed. Vascular/Lymphatic: Moderate calcified plaque over the abdominal aorta. No adenopathy. Reproductive: Normal. Other: Mild patchy free fluid in the abdomen and pelvis. No free peritoneal air. Musculoskeletal:  Degenerative change of the spine and hips. IMPRESSION: Evidence of mid to distal small bowel obstruction with transition point likely within the anterior mid abdomen due to adhesions. Mild patchy ascites. Bilateral renal cysts. Indeterminate 1.7 cm right renal cortical hypodensity unchanged and likely a hyperdense cyst. Consider follow-up MRI with and without contrast in 6 months. Stable 1 cm liver cyst. Aortic Atherosclerosis (ICD10-I70.0). Mild cardiomegaly.  Minimal atherosclerotic coronary artery disease. Electronically Signed   By: Elberta Fortis M.D.   On: 07/08/2018 02:03       Assessment & Plan:    Principal Problem:   SBO (small bowel obstruction) (HCC)   SBO NPO NGT to low intermittent suction Surgery consulted by ED, appreciate input  N/v Zofran 4mg  iv q6h prn  Hypertension Vasotec 1.25mg  iv q6h  Hydralazine 10mg  iv q6h prn sbp >160  Dm2 STOP Metformin fsbs q4h,  ISS  Gerd pepcid 20mg  iv bid  Lipase elevation Likely due to SBO       DVT Prophylaxis   Lovenox - SCDs  AM Labs Ordered, also please review Full Orders  Family Communication: Admission, patients condition and plan of care including tests being ordered have been discussed with the patient  who indicate understanding and agree with the plan and Code Status.  Code Status  FULL CODE  Likely DC to  home  Condition GUARDED   Consults called:   Surgery by ED, appreciate input  Admission status: observation, pt will require hospitalization for SBO, and NGT to low intermittent suction, depending upon his clinical improvement might require change to  inpatient status  Time spent in minutes : 70   Pearson Grippe M.D on 07/08/2018 at 2:46 AM  Between 7am to 7pm - Pager - [phone]  . After 7pm go to [email or web address] - password [facility]  [facility] - Office  [phone]

## 2018-07-08 NOTE — ED Notes (Signed)
MD at bedside. 

## 2018-07-08 NOTE — Progress Notes (Signed)
Patient seen and examined this morning at bedside.  Patient was admitted overnight by Dr. Selena Batten for small bowel obstruction.  This morning patient has NG tube in place attached to suction which is making his nausea and vomiting better.  Evaluated by general surgery this morning.  Has not passed flatulence yet.  Vital signs appears to be stable. Vitals:   07/08/18 1128 07/08/18 1200  BP: (!) 164/86 (!) 144/79  Pulse:    Resp: 19 20  Temp:    SpO2:     On physical exam patient appears comfortable but has NG tube in place to suction at this time.  Minimal bowel sounds.  Rest of the physical exam is overall unremarkable.  CT abdomen pelvis from overnight-mid to distal small bowel obstruction, bilateral renal cyst and 1 cm liver cyst.   Assessment and plan:  Partial small bowel obstruction -Maintain patient n.p.o. status, NG tube to low intermittent suction.  Spoke with Dr. Lovell Sheehan this morning who advised conservative management at this time, if no improvement noted in next 24-48 hours then will proceed with surgical intervention.  Antiemetics as necessary, IV fluids.  While n.p.o. Accu-Cheks every 4-6 hours along with insulin sliding scale.  Continue to hold metformin at the time.   Please call with further questions as necessary.   Stephania Fragmin MD [facility]

## 2018-07-08 NOTE — ED Notes (Signed)
Placed on hospital bed

## 2018-07-08 NOTE — Consult Note (Signed)
Reason for Consult: Small bowel obstruction Referring Physician: Dr. Armstead Peaks is an 67 y.o. male.  HPI: Patient is a 67 year old black male who was recently discharged from the hospital 1 month ago for a partial small bowel obstruction that did not require surgical intervention who now presents with worsening abdominal pain, nausea, and abdominal distention.  He states he had 2 normal bowel movements yesterday morning.  Yesterday evening, he began advancing abdominal cramping, nausea, and vomiting.  He presented to the emergency room a CT scan of the abdomen revealed small bowel obstruction most likely secondary to adhesive disease.  He has had exploratory laparotomy last year at another facility for a bowel obstruction.  During his previous admission, an NG tube was placed and he did resolve on its own.  This morning, he states his abdominal pain has resolved.  He is still feeling distended, but feels better.  Past Medical History:  Diagnosis Date  . Diabetes mellitus without complication ([place])   . GERD (gastroesophageal reflux disease)   . High cholesterol   . Hypertension     Past Surgical History:  Procedure Laterality Date  . ABDOMINAL SURGERY    . KNEE SURGERY      Family History  Problem Relation Age of Onset  . Diabetes Father     Social History:  reports that he has never smoked. He has never used smokeless tobacco. He reports that he drinks alcohol. His drug history is not on file.  Allergies: No Known Allergies  Medications: I have reviewed the patient's current medications.  Results for orders placed or performed during the hospital encounter of 07/07/18 (from the past 48 hour(s))  CBC with Differential     Status: Abnormal   Collection Time: 07/07/18 11:34 PM  Result Value Ref Range   WBC 9.9 4.0 - 10.5 K/uL   RBC 4.65 4.22 - 5.81 MIL/uL   Hemoglobin 14.0 13.0 - 17.0 g/dL   HCT 43.7 39.0 - 52.0 %   MCV 94.0 80.0 - 100.0 fL   MCH 30.1 26.0 - 34.0 pg   MCHC  32.0 30.0 - 36.0 g/dL   RDW 13.8 11.5 - 15.5 %   Platelets 259 150 - 400 K/uL   nRBC 0.0 0.0 - 0.2 %   Neutrophils Relative % 89 %   Neutro Abs 8.7 (H) 1.7 - 7.7 K/uL   Lymphocytes Relative 8 %   Lymphs Abs 0.7 0.7 - 4.0 K/uL   Monocytes Relative 3 %   Monocytes Absolute 0.3 0.1 - 1.0 K/uL   Eosinophils Relative 0 %   Eosinophils Absolute 0.0 0.0 - 0.5 K/uL   Basophils Relative 0 %   Basophils Absolute 0.0 0.0 - 0.1 K/uL   Immature Granulocytes 0 %   Abs Immature Granulocytes 0.03 0.00 - 0.07 K/uL    Comment: Performed at [facility], [address] [place], [place] [address]  Comprehensive metabolic panel     Status: Abnormal   Collection Time: 07/07/18 11:34 PM  Result Value Ref Range   Sodium 138 135 - 145 mmol/L   Potassium 3.6 3.5 - 5.1 mmol/L   Chloride 104 98 - 111 mmol/L   CO2 23 22 - 32 mmol/L   Glucose, Bld 131 (H) 70 - 99 mg/dL   BUN 8 8 - 23 mg/dL   Creatinine, Ser 1.10 0.61 - 1.24 mg/dL   Calcium 9.8 8.9 - 10.3 mg/dL   Total Protein 8.4 (H) 6.5 - 8.1 g/dL   Albumin  4.8 3.5 - 5.0 g/dL   AST 27 15 - 41 U/L   ALT 22 0 - 44 U/L   Alkaline Phosphatase 50 38 - 126 U/L   Total Bilirubin 1.0 0.3 - 1.2 mg/dL   GFR calc non Af Amer >60 >60 mL/min   GFR calc Af Amer >60 >60 mL/min    Comment: (NOTE) The eGFR has been calculated using the CKD EPI equation. This calculation has not been validated in all clinical situations. eGFR's persistently <60 mL/min signify possible Chronic Kidney Disease.    Anion gap 11 5 - 15    Comment: Performed at [facility], [address], [place], [place] [address]  Lipase, blood     Status: Abnormal   Collection Time: 07/07/18 11:34 PM  Result Value Ref Range   Lipase 62 (H) 11 - 51 U/L    Comment: Performed at [facility], [address], [place], [place] [address]  CBG monitoring, ED     Status: Abnormal   Collection Time: 07/08/18  2:52 AM  Result Value Ref Range   Glucose-Capillary 120 (H) 70 - 99 mg/dL  CBG monitoring, ED      Status: Abnormal   Collection Time: 07/08/18  5:48 AM  Result Value Ref Range   Glucose-Capillary 100 (H) 70 - 99 mg/dL  CBG monitoring, ED     Status: None   Collection Time: 07/08/18  8:18 AM  Result Value Ref Range   Glucose-Capillary 86 70 - 99 mg/dL    Ct Abdomen Pelvis W Contrast  Result Date: 07/08/2018 CLINICAL DATA:  Abdominal pain with episode of vomiting today. EXAM: CT ABDOMEN AND PELVIS WITH CONTRAST TECHNIQUE: Multidetector CT imaging of the abdomen and pelvis was performed using the standard protocol following bolus administration of intravenous contrast. CONTRAST:  166m ISOVUE-300 IOPAMIDOL (ISOVUE-300) INJECTION 61% COMPARISON:  06/09/2018 FINDINGS: Lower chest: Lung bases demonstrate mild bibasilar atelectatic change. Calcified plaque over the right coronary artery. Mild cardiomegaly. Hepatobiliary: Stable 1 cm cyst over the left lobe of the liver. Remainder of the liver, gallbladder and biliary tree are normal. Pancreas: Normal. Spleen: Normal. Adrenals/Urinary Tract: Adrenal glands are normal. Kidneys are normal in size without hydronephrosis or nephrolithiasis. Several bilateral renal cysts are present. Indeterminate 1.7 cm hypodensity over the mid pole right kidney unchanged and likely a hyperdense cyst. Ureters and bladder are normal. Stomach/Bowel: Stomach is within normal. There are multiple air and fluid-filled dilated small bowel loops measuring up to 4.9 cm in diameter. Transition point may be over the anterior mid abdomen possibly due to adhesions. Appendix is normal. Colon is somewhat decompressed. Vascular/Lymphatic: Moderate calcified plaque over the abdominal aorta. No adenopathy. Reproductive: Normal. Other: Mild patchy free fluid in the abdomen and pelvis. No free peritoneal air. Musculoskeletal: Degenerative change of the spine and hips. IMPRESSION: Evidence of mid to distal small bowel obstruction with transition point likely within the anterior mid abdomen due to  adhesions. Mild patchy ascites. Bilateral renal cysts. Indeterminate 1.7 cm right renal cortical hypodensity unchanged and likely a hyperdense cyst. Consider follow-up MRI with and without contrast in 6 months. Stable 1 cm liver cyst. Aortic Atherosclerosis (ICD10-I70.0). Mild cardiomegaly.  Minimal atherosclerotic coronary artery disease. Electronically Signed   By: DMarin OlpM.D.   On: 07/08/2018 02:03   Dg Chest Portable 1 View  Result Date: 07/08/2018 CLINICAL DATA:  67y/o  M; in G-tube placement. EXAM: PORTABLE CHEST 1 VIEW COMPARISON:  06/09/2018 chest radiograph FINDINGS: Stable normal cardiac silhouette given projection  and technique. Calcific aortic atherosclerosis. Enteric tube is coiling within the proximal stomach. Elevated left hemidiaphragm. Left basilar atelectasis. No new consolidation, effusion, or pneumothorax. Bones are unremarkable. IMPRESSION: Enteric tube is coiling within the proximal stomach. Elevated left hemidiaphragm. Left basilar atelectasis. Electronically Signed   By: Kristine Garbe M.D.   On: 07/08/2018 03:08    ROS:  Pertinent items are noted in HPI.  Blood pressure (!) 137/91, pulse 63, temperature 98.2 F (36.8 C), temperature source Oral, resp. rate 18, height '5\' 5"'  (1.651 m), weight 75.8 kg, SpO2 99 %. Physical Exam: Pleasant well-developed well-nourished black male no acute distress Head is normocephalic, atraumatic Lungs clear to auscultation with equal breath sounds bilaterally Heart examination reveals regular rate and rhythm without S3, S4, murmurs Abdomen is soft but mildly distended.  Occasional bowel sounds appreciated.  No rigidity is noted.  No tenderness is noted.  CT scan images personally reviewed  Assessment/Plan: Impression: Recurrent small bowel obstruction.  Is improved with NG tube decompression. Plan: Would continue NG tube decompression for now.  He does not require acute surgical intervention.  Should he not progress well  with this course, we will proceed with exploratory laparotomy.  Patient understands and agrees to the treatment plan.  Aviva Signs 07/08/2018, 8:24 AM

## 2018-07-08 NOTE — Care Management Obs Status (Signed)
MEDICARE OBSERVATION STATUS NOTIFICATION   Patient Details  Name: [name] Boyle MRN: [id number] Date of Birth: 1951-03-03   Medicare Observation Status Notification Given:  Yes    Malcolm Metro, RN 07/08/2018, 8:55 AM

## 2018-07-09 DIAGNOSIS — E785 Hyperlipidemia, unspecified: Secondary | ICD-10-CM

## 2018-07-09 DIAGNOSIS — I10 Essential (primary) hypertension: Secondary | ICD-10-CM

## 2018-07-09 DIAGNOSIS — E119 Type 2 diabetes mellitus without complications: Secondary | ICD-10-CM

## 2018-07-09 DIAGNOSIS — K219 Gastro-esophageal reflux disease without esophagitis: Secondary | ICD-10-CM

## 2018-07-09 LAB — COMPREHENSIVE METABOLIC PANEL
ALT: 18 U/L (ref 0–44)
AST: 19 U/L (ref 15–41)
Albumin: 4.1 g/dL (ref 3.5–5.0)
Alkaline Phosphatase: 46 U/L (ref 38–126)
Anion gap: 9 (ref 5–15)
BUN: 11 mg/dL (ref 8–23)
CHLORIDE: 111 mmol/L (ref 98–111)
CO2: 23 mmol/L (ref 22–32)
CREATININE: 1.2 mg/dL (ref 0.61–1.24)
Calcium: 9.4 mg/dL (ref 8.9–10.3)
GFR calc non Af Amer: >60 mL/min (ref >60)
Glucose, Bld: 84 mg/dL (ref 70–99)
Potassium: 3.7 mmol/L (ref 3.5–5.1)
SODIUM: 143 mmol/L (ref 135–145)
Total Bilirubin: 1.6 mg/dL — ABNORMAL HIGH (ref 0.3–1.2)
Total Protein: 7 g/dL (ref 6.5–8.1)

## 2018-07-09 LAB — CBC
HEMATOCRIT: 38.5 % — AB (ref 39.0–52.0)
HEMOGLOBIN: 12.2 g/dL — AB (ref 13.0–17.0)
MCH: 30.5 pg (ref 26.0–34.0)
MCHC: 31.7 g/dL (ref 30.0–36.0)
MCV: 96.3 fL (ref 80.0–100.0)
NRBC: 0 % (ref 0.0–0.2)
Platelets: 224 10*3/uL (ref 150–400)
RBC: 4 MIL/uL — ABNORMAL LOW (ref 4.22–5.81)
RDW: 13.9 % (ref 11.5–15.5)
WBC: 6.7 10*3/uL (ref 4.0–10.5)

## 2018-07-09 LAB — GLUCOSE, CAPILLARY
GLUCOSE-CAPILLARY: 82 mg/dL (ref 70–99)
GLUCOSE-CAPILLARY: 78 mg/dL (ref 70–99)
GLUCOSE-CAPILLARY: 106 mg/dL — AB (ref 70–99)
Glucose-Capillary: 95 mg/dL (ref 70–99)
Glucose-Capillary: 102 mg/dL — ABNORMAL HIGH (ref 70–99)

## 2018-07-09 LAB — MAGNESIUM: Magnesium: 2.1 mg/dL (ref 1.7–2.4)

## 2018-07-09 MED ORDER — PANTOPRAZOLE SODIUM 40 MG IV SOLR
40.0000 mg | INTRAVENOUS | Status: DC
  Filled 2018-07-09: qty 40

## 2018-07-09 MED ORDER — KCL IN DEXTROSE-NACL 20-5-0.45 MEQ/L-%-% IV SOLN
INTRAVENOUS | Status: DC
  Administered 2018-07-09 – 2018-07-11 (×3): via INTRAVENOUS

## 2018-07-09 NOTE — Progress Notes (Signed)
PROGRESS NOTE    [name] Boyle  RUE:[id number] DOB: [birth year] DOA: 07/07/2018 PCP: Pearson Grippe, MD     Brief Narrative:  67 y.o. male, w dm2, hypertension, hyperlipidemia, w / h/o prior SBO apparently presents with c/o n/v x2 (no blood), and centralized abdominal pain.  Pt denies fever, chills, diarrhea, brbpr, black stool. Pt presented to ED due to n/v, and found to have SBO, presumably due to adhesions.   Assessment & Plan: 1-SBO/partial SBO -Appreciate general surgery consultation and recommendations -Continue conservative management -Keep NG tube in place for another 24 hours -Replete electrolytes to maintain good hydration -patient reported passing gas -no nausea, no vomiting. -will ask for increase activity   2-hypertension -Continue Vasotec and PRN hydralazine.  3-GERD -continue IV PPI  4-type 2 diabetes -continue holding oral hypoglycemic agents while n.p.o. -Follow CBGs and use a sliding scale insulin.  5-HLD -will resume statins when able to take PO  DVT prophylaxis: Lovenox Code Status: Full code Family Communication: No family at bedside. Disposition Plan: Remain inpatient, continue conservative management, keep NG in place; continue electrolytes repletion.  Follow general surgery recommendations.  Consultants:   General surgery  Procedures:  See below for x-ray reports.  Antimicrobials:  Anti-infectives (From admission, onward)   None       Subjective: No acute distress, denies chest pain, no shortness of breath, expressed no further nausea or vomiting and denies abdominal pain at this time.  NG tube still in place with significant output.  Patient expressed passing gas.  Objective: Vitals:   07/08/18 2350 07/09/18 0604 07/09/18 0901 07/09/18 1552  BP: 136/79 (!) 145/75  (!) 158/86  Pulse: (!) 56 61  65  Resp:  19  20  Temp:  98.3 F (36.8 C)  99.3 F (37.4 C)  TempSrc:  Oral  Oral  SpO2: 99% 99% 98% 98%  Weight:      Height:         Intake/Output Summary (Last 24 hours) at 07/09/2018 1813 Last data filed at 07/09/2018 1800 Gross per 24 hour  Intake 1765.91 ml  Output 1350 ml  Net 415.91 ml   Filed Weights   07/07/18 2156 07/08/18 1427  Weight: 75.8 kg 74.8 kg    Examination: General exam: Alert, awake, oriented x 3; reports no abdominal pain, no further nausea vomiting and is passing some gas.  NG tube in place. Respiratory system: Clear to auscultation. Respiratory effort normal. Cardiovascular system:RRR. No murmurs, rubs, gallops. Gastrointestinal system: Abdomen is nontender on palpation; no guarding, no bowel sounds appreciated.   Central nervous system: Alert and oriented. No focal neurological deficits. Extremities: No C/C/E, +pedal pulses Skin: No rashes, lesions or ulcers Psychiatry: Judgement and insight appear normal. Mood & affect appropriate.     Data Reviewed: I have personally reviewed following labs and imaging studies  CBC: Recent Labs  Lab 07/07/18 2334 07/09/18 0456  WBC 9.9 6.7  NEUTROABS 8.7*  --   HGB 14.0 12.2*  HCT 43.7 38.5*  MCV 94.0 96.3  PLT 259 224   Basic Metabolic Panel: Recent Labs  Lab 07/07/18 2334 07/09/18 0456  NA 138 143  K 3.6 3.7  CL 104 111  CO2 23 23  GLUCOSE 131* 84  BUN 8 11  CREATININE 1.10 1.20  CALCIUM 9.8 9.4  MG  --  2.1   GFR: Estimated Creatinine Clearance: 55.7 mL/min (by C-G formula based on SCr of 1.2 mg/dL).   Liver Function Tests: Recent Labs  Lab 07/07/18 2334 07/09/18  0456  AST 27 19  ALT 22 18  ALKPHOS 50 46  BILITOT 1.0 1.6*  PROT 8.4* 7.0  ALBUMIN 4.8 4.1   Recent Labs  Lab 07/07/18 2334  LIPASE 62*   CBG: Recent Labs  Lab 07/08/18 2349 07/09/18 0342 07/09/18 0733 07/09/18 1118 07/09/18 1653  GLUCAP 82 82 78 95 102*    Radiology Studies: Ct Abdomen Pelvis W Contrast  Result Date: 07/08/2018 CLINICAL DATA:  Abdominal pain with episode of vomiting today. EXAM: CT ABDOMEN AND PELVIS WITH CONTRAST  TECHNIQUE: Multidetector CT imaging of the abdomen and pelvis was performed using the standard protocol following bolus administration of intravenous contrast. CONTRAST:  ISOVUE-300 IOPAMIDOL (ISOVUE-300) INJECTION 61% COMPARISON:  06/09/2018 FINDINGS: Lower chest: Lung bases demonstrate mild bibasilar atelectatic change. Calcified plaque over the right coronary artery. Mild cardiomegaly. Hepatobiliary: Stable 1 cm cyst over the left lobe of the liver. Remainder of the liver, gallbladder and biliary tree are normal. Pancreas: Normal. Spleen: Normal. Adrenals/Urinary Tract: Adrenal glands are normal. Kidneys are normal in size without hydronephrosis or nephrolithiasis. Several bilateral renal cysts are present. Indeterminate 1.7 cm hypodensity over the mid pole right kidney unchanged and likely a hyperdense cyst. Ureters and bladder are normal. Stomach/Bowel: Stomach is within normal. There are multiple air and fluid-filled dilated small bowel loops measuring up to 4.9 cm in diameter. Transition point may be over the anterior mid abdomen possibly due to adhesions. Appendix is normal. Colon is somewhat decompressed. Vascular/Lymphatic: Moderate calcified plaque over the abdominal aorta. No adenopathy. Reproductive: Normal. Other: Mild patchy free fluid in the abdomen and pelvis. No free peritoneal air. Musculoskeletal: Degenerative change of the spine and hips. IMPRESSION: Evidence of mid to distal small bowel obstruction with transition point likely within the anterior mid abdomen due to adhesions. Mild patchy ascites. Bilateral renal cysts. Indeterminate 1.7 cm right renal cortical hypodensity unchanged and likely a hyperdense cyst. Consider follow-up MRI with and without contrast in 6 months. Stable 1 cm liver cyst. Aortic Atherosclerosis (ICD10-I70.0). Mild cardiomegaly.  Minimal atherosclerotic coronary artery disease. Electronically Signed   By: Elberta Fortis M.D.   On: 07/08/2018 02:03   Dg Chest  Portable 1 View  Result Date: 07/08/2018 CLINICAL DATA:  67 y/o  M; in G-tube placement. EXAM: PORTABLE CHEST 1 VIEW COMPARISON:  06/09/2018 chest radiograph FINDINGS: Stable normal cardiac silhouette given projection and technique. Calcific aortic atherosclerosis. Enteric tube is coiling within the proximal stomach. Elevated left hemidiaphragm. Left basilar atelectasis. No new consolidation, effusion, or pneumothorax. Bones are unremarkable. IMPRESSION: Enteric tube is coiling within the proximal stomach. Elevated left hemidiaphragm. Left basilar atelectasis. Electronically Signed   By: Mitzi Hansen M.D.   On: 07/08/2018 03:08     Scheduled Meds: . aspirin  81 mg Oral Daily  . azelastine  1 spray Each Nare BID  . enalaprilat  1.25 mg Intravenous Q6H  . enoxaparin (LOVENOX) injection  40 mg Subcutaneous Q24H  . fluticasone  2 spray Each Nare Daily  . insulin aspart  0-9 Units Subcutaneous Q4H  . [START ON 07/10/2018] pantoprazole (PROTONIX) IV  40 mg Intravenous Q24H   Continuous Infusions: . sodium chloride Stopped (07/08/18 0349)  . dextrose 5 % and 0.45 % NaCl with KCl 20 mEq/L 100 mL/hr at 07/09/18 1800     LOS: 1 day    Time spent: 30 minutes  Vassie Loll, MD [facility] Pager [phone]  If 7PM-7AM, please contact night-coverage [email or web address] Password [email or web address] 07/09/2018, 6:13 PM

## 2018-07-09 NOTE — Progress Notes (Signed)
Subjective: Denies any abdominal pain.  Has been passing some gas.  Objective: Vital signs in last 24 hours: Temp:  [98.3 F (36.8 C)-98.5 F (36.9 C)] 98.3 F (36.8 C) (10/30 0604) Pulse Rate:  [53-61] 61 (10/30 0604) Resp:  [16-20] 19 (10/30 0604) BP: (136-164)/(75-88) 145/75 (10/30 0604) SpO2:  [98 %-99 %] 98 % (10/30 0901) Weight:  [74.8 kg] 74.8 kg (10/29 1427) Last BM Date: 07/07/18  Intake/Output from previous day: 10/29 0701 - 10/30 0700 In: 1481.9 [I.V.:1388.3; IV Piggyback:93.6] Out: 200 [Urine:200] Intake/Output this shift: No intake/output data recorded.  General appearance: alert, cooperative and no distress GI: soft, non-tender; bowel sounds normal; no masses,  no organomegaly  Lab Results:  Recent Labs    07/07/18 2334 07/09/18 0456  WBC 9.9 6.7  HGB 14.0 12.2*  HCT 43.7 38.5*  PLT 259 224   BMET Recent Labs    07/07/18 2334 07/09/18 0456  NA 138 143  K 3.6 3.7  CL 104 111  CO2 23 23  GLUCOSE 131* 84  BUN 8 11  CREATININE 1.10 1.20  CALCIUM 9.8 9.4   PT/INR No results for input(s): LABPROT, INR in the last 72 hours.  Studies/Results: Ct Abdomen Pelvis W Contrast  Result Date: 07/08/2018 CLINICAL DATA:  Abdominal pain with episode of vomiting today. EXAM: CT ABDOMEN AND PELVIS WITH CONTRAST TECHNIQUE: Multidetector CT imaging of the abdomen and pelvis was performed using the standard protocol following bolus administration of intravenous contrast. CONTRAST:  ISOVUE-300 IOPAMIDOL (ISOVUE-300) INJECTION 61% COMPARISON:  06/09/2018 FINDINGS: Lower chest: Lung bases demonstrate mild bibasilar atelectatic change. Calcified plaque over the right coronary artery. Mild cardiomegaly. Hepatobiliary: Stable 1 cm cyst over the left lobe of the liver. Remainder of the liver, gallbladder and biliary tree are normal. Pancreas: Normal. Spleen: Normal. Adrenals/Urinary Tract: Adrenal glands are normal. Kidneys are normal in size without hydronephrosis  or nephrolithiasis. Several bilateral renal cysts are present. Indeterminate 1.7 cm hypodensity over the mid pole right kidney unchanged and likely a hyperdense cyst. Ureters and bladder are normal. Stomach/Bowel: Stomach is within normal. There are multiple air and fluid-filled dilated small bowel loops measuring up to 4.9 cm in diameter. Transition point may be over the anterior mid abdomen possibly due to adhesions. Appendix is normal. Colon is somewhat decompressed. Vascular/Lymphatic: Moderate calcified plaque over the abdominal aorta. No adenopathy. Reproductive: Normal. Other: Mild patchy free fluid in the abdomen and pelvis. No free peritoneal air. Musculoskeletal: Degenerative change of the spine and hips. IMPRESSION: Evidence of mid to distal small bowel obstruction with transition point likely within the anterior mid abdomen due to adhesions. Mild patchy ascites. Bilateral renal cysts. Indeterminate 1.7 cm right renal cortical hypodensity unchanged and likely a hyperdense cyst. Consider follow-up MRI with and without contrast in 6 months. Stable 1 cm liver cyst. Aortic Atherosclerosis (ICD10-I70.0). Mild cardiomegaly.  Minimal atherosclerotic coronary artery disease. Electronically Signed   By: Elberta Fortis M.D.   On: 07/08/2018 02:03   Dg Chest Portable 1 View  Result Date: 07/08/2018 CLINICAL DATA:  67 y/o  M; in G-tube placement. EXAM: PORTABLE CHEST 1 VIEW COMPARISON:  06/09/2018 chest radiograph FINDINGS: Stable normal cardiac silhouette given projection and technique. Calcific aortic atherosclerosis. Enteric tube is coiling within the proximal stomach. Elevated left hemidiaphragm. Left basilar atelectasis. No new consolidation, effusion, or pneumothorax. Bones are unremarkable. IMPRESSION: Enteric tube is coiling within the proximal stomach. Elevated left hemidiaphragm. Left basilar atelectasis. Electronically Signed   By: Buzzy Han.D.  On: 07/08/2018 03:08     Anti-infectives: Anti-infectives (From admission, onward)   None      Assessment/Plan: s/p  Impression: Small bowel obstruction, partial.  NG tube still with significant output.  Patient denies any abdominal pain.  No need for acute surgical intervention. Plan: Continue NG tube decompression.  Will reassess in a.m.  LOS: 1 day    Franky Macho 07/09/2018

## 2018-07-10 ENCOUNTER — Inpatient Hospital Stay (HOSPITAL_COMMUNITY): Payer: Medicare Other

## 2018-07-10 LAB — GLUCOSE, CAPILLARY
GLUCOSE-CAPILLARY: 100 mg/dL — AB (ref 70–99)
GLUCOSE-CAPILLARY: 105 mg/dL — AB (ref 70–99)
GLUCOSE-CAPILLARY: 108 mg/dL — AB (ref 70–99)
GLUCOSE-CAPILLARY: 96 mg/dL (ref 70–99)
Glucose-Capillary: 103 mg/dL — ABNORMAL HIGH (ref 70–99)
Glucose-Capillary: 88 mg/dL (ref 70–99)
Glucose-Capillary: 89 mg/dL (ref 70–99)

## 2018-07-10 MED ORDER — PANTOPRAZOLE SODIUM 40 MG PO TBEC
40.0000 mg | DELAYED_RELEASE_TABLET | Freq: Every day | ORAL | Status: DC
  Administered 2018-07-10 – 2018-07-12 (×3): 40 mg via ORAL
  Filled 2018-07-10 (×3): qty 1

## 2018-07-10 MED ORDER — ALUM & MAG HYDROXIDE-SIMETH 200-200-20 MG/5ML PO SUSP
30.0000 mL | ORAL | Status: DC | PRN
  Administered 2018-07-12: 30 mL via ORAL
  Filled 2018-07-10: qty 30

## 2018-07-10 NOTE — Progress Notes (Signed)
  Subjective: Patient has had multiple episodes of flatus.  No abdominal pain noted.  He states he feels better.  Objective: Vital signs in last 24 hours: Temp:  [98.1 F (36.7 C)-99.3 F (37.4 C)] 98.8 F (37.1 C) (10/31 0457) Pulse Rate:  [58-65] 58 (10/31 0457) Resp:  [18-20] 18 (10/31 0457) BP: (114-164)/(83-86) 164/84 (10/31 0457) SpO2:  [95 %-100 %] 95 % (10/31 0457) Weight:  [74.7 kg] 74.7 kg (10/31 0457) Last BM Date: 07/07/18  Intake/Output from previous day: 10/30 0701 - 10/31 0700 In: 1877 [I.V.:1827; IV Piggyback:50] Out: 3150 [Urine:900; Emesis/NG output:2250] Intake/Output this shift: No intake/output data recorded.  General appearance: alert, cooperative and no distress GI: soft, non-tender; bowel sounds normal; no masses,  no organomegaly  Lab Results:  Recent Labs    07/07/18 2334 07/09/18 0456  WBC 9.9 6.7  HGB 14.0 12.2*  HCT 43.7 38.5*  PLT 259 224   BMET Recent Labs    07/07/18 2334 07/09/18 0456  NA 138 143  K 3.6 3.7  CL 104 111  CO2 23 23  GLUCOSE 131* 84  BUN 8 11  CREATININE 1.10 1.20  CALCIUM 9.8 9.4   PT/INR No results for input(s): LABPROT, INR in the last 72 hours.  Studies/Results: No results found.  Anti-infectives: Anti-infectives (From admission, onward)   None      Assessment/Plan: Impression: Partial small bowel obstruction.  Clinically seems to be resolving. Plan: Even though his NG tube output was high, will remove NG tube to see if he tolerates clear liquid diet.  Should he regress, will proceed with exploratory laparotomy tomorrow.  Patient understands treatment plan.  LOS: 2 days    [name] Boyle 07/10/2018

## 2018-07-10 NOTE — Progress Notes (Signed)
PROGRESS NOTE    [name] Boyle  HYQ:[id number] DOB: [birth year] DOA: 07/07/2018 PCP: Pearson Grippe, MD     Brief Narrative:  67 y.o. male, w dm2, hypertension, hyperlipidemia, w / h/o prior SBO apparently presents with c/o n/v x2 (no blood), and centralized abdominal pain.  Pt denies fever, chills, diarrhea, brbpr, black stool. Pt presented to ED due to n/v, and found to have SBO, presumably due to adhesions.   Assessment & Plan: 1-SBO/partial SBO -Appreciate general surgery consultation and recommendations -NGT removed and CLD started; will follow response. -continue to replete electrolytes and maintain good hydration -patient reported passing gas -no nausea, no vomiting, no abd pain. -continue increase activity   2-hypertension -Continue Vasotec and PRN hydralazine for now. -once tolerating PO's will resume home antihypertensive regimen.   3-GERD -continue IV PPI for now; restart PO meds when tolerating by mouth.  4-type 2 diabetes -continue holding oral hypoglycemic agents while inpatient -Follow CBGs and use a sliding scale insulin.  5-HLD -will resume statins at discharge.  DVT prophylaxis: Lovenox Code Status: Full code Family Communication: No family at bedside. Disposition Plan: Remain inpatient, continue conservative management, keep NG in place; continue electrolytes repletion.  Follow general surgery recommendations.  Consultants:   General surgery  Procedures:  See below for x-ray reports.  Antimicrobials:  Anti-infectives (From admission, onward)   None      Subjective: No acute distress, denies chest pain, no shortness of breath, no fever.  Patient denies nausea, vomiting and abdominal pain.  Objective: Vitals:   07/09/18 1552 07/09/18 1951 07/09/18 2115 07/10/18 0457  BP: (!) 158/86  114/83 (!) 164/84  Pulse: 65  (!) 59 (!) 58  Resp: 20  19 18   Temp: 99.3 F (37.4 C)  98.1 F (36.7 C) 98.8 F (37.1 C)  TempSrc: Oral  Oral Oral  SpO2: 98%  98% 100% 95%  Weight:    74.7 kg  Height:        Intake/Output Summary (Last 24 hours) at 07/10/2018 1156 Last data filed at 07/10/2018 1000 Gross per 24 hour  Intake 2902.21 ml  Output 2350 ml  Net 552.21 ml   Filed Weights   07/07/18 2156 07/08/18 1427 07/10/18 0457  Weight: 75.8 kg 74.8 kg 74.7 kg    Examination: General exam: Alert, awake, oriented x 3; reports no abdominal pain, no nausea, no vomiting and he continue passing gas.  No fever, no chest pain, no shortness of breath.  NG tube has been removed. Respiratory system: Clear to auscultation. Respiratory effort normal. Cardiovascular system:RRR. No murmurs, rubs, gallops. Gastrointestinal system: Abdomen is nondistended, soft and nontender. No organomegaly or masses felt. Normal bowel sounds heard. Central nervous system: Alert and oriented. No focal neurological deficits. Extremities: No C/C/E, +pedal pulses Skin: No rashes, lesions or ulcers Psychiatry: Judgement and insight appear normal. Mood & affect appropriate.    Data Reviewed: I have personally reviewed following labs and imaging studies  CBC: Recent Labs  Lab 07/07/18 2334 07/09/18 0456  WBC 9.9 6.7  NEUTROABS 8.7*  --   HGB 14.0 12.2*  HCT 43.7 38.5*  MCV 94.0 96.3  PLT 259 224   Basic Metabolic Panel: Recent Labs  Lab 07/07/18 2334 07/09/18 0456  NA 138 143  K 3.6 3.7  CL 104 111  CO2 23 23  GLUCOSE 131* 84  BUN 8 11  CREATININE 1.10 1.20  CALCIUM 9.8 9.4  MG  --  2.1   GFR: Estimated Creatinine Clearance: 55.7 mL/min (by C-G  formula based on SCr of 1.2 mg/dL).   Liver Function Tests: Recent Labs  Lab 07/07/18 2334 07/09/18 0456  AST 27 19  ALT 22 18  ALKPHOS 50 46  BILITOT 1.0 1.6*  PROT 8.4* 7.0  ALBUMIN 4.8 4.1   Recent Labs  Lab 07/07/18 2334  LIPASE 62*   CBG: Recent Labs  Lab 07/09/18 2003 07/10/18 0001 07/10/18 0442 07/10/18 0833 07/10/18 1124  GLUCAP 106* 108* 96 105* 103*    Radiology Studies: Dg  Abd 2 Views  Result Date: 07/10/2018 CLINICAL DATA:  Constipation for 2 days. EXAM: ABDOMEN - 2 VIEW COMPARISON:  CT 07/08/2018 FINDINGS: Small bowel dilation has improved compared to the previous CT. Small-bowel remains mildly prominent and there are multiple air-fluid levels on the erect view. No free air. No acute skeletal or soft tissue abnormality. Minor atelectasis noted at the left lung base. IMPRESSION: 1. Improved partial small bowel obstruction 2. No free air. Electronically Signed   By: Amie Portland M.D.   On: 07/10/2018 09:15   Scheduled Meds: . azelastine  1 spray Each Nare BID  . enalaprilat  1.25 mg Intravenous Q6H  . enoxaparin (LOVENOX) injection  40 mg Subcutaneous Q24H  . fluticasone  2 spray Each Nare Daily  . insulin aspart  0-9 Units Subcutaneous Q4H  . pantoprazole  40 mg Oral Q1200   Continuous Infusions: . sodium chloride Stopped (07/08/18 0349)  . dextrose 5 % and 0.45 % NaCl with KCl 20 mEq/L 50 mL/hr at 07/10/18 0756     LOS: 2 days    Time spent: 30 minutes  Vassie Loll, MD [facility] Pager [phone]  If 7PM-7AM, please contact night-coverage [email or web address] Password [email or web address] 07/10/2018, 11:56 AM

## 2018-07-10 NOTE — Discharge Instructions (Signed)
Adhesions °Adhesions are strings of tissue that form inside your body. Adhesions are sticky. They can stick to organs and tissues and pull them out of place. They can also keep food and other things from moving through the body normally. °Follow these instructions at home: °· Take over-the-counter and prescription medicines only as told by your doctor. °· Take your antibiotic medicine as told by your doctor. Do not stop taking the antibiotic even if you start to feel better. °· Follow any diet instructions your doctor gives you. Your doctor may tell you to eat liquid diet or low-fiber diet. °· Keep all follow-up visits as told by your doctor. This is important. °Contact a doctor if: °· You have a fever. °· You have pain in your belly or pelvis. °· You vomit. °· You are bloated. °· Your belly swells. °· Your gut (bowel) makes loud sounds. °· You are having trouble pooping (constipation). °Get help right away if: °· You have very bad pain in your belly or pelvis that gets worse. °· You keep vomiting. °· You cannot fart (pass gas). °· You cannot poop. °This information is not intended to replace advice given to you by your health care provider. Make sure you discuss any questions you have with your health care provider. °Document Released: 11/23/2008 Document Revised: 02/02/2016 Document Reviewed: 01/12/2015 °Elsevier Interactive Patient Education © 2018 [employer] ° °

## 2018-07-11 LAB — GLUCOSE, CAPILLARY
GLUCOSE-CAPILLARY: 83 mg/dL (ref 70–99)
GLUCOSE-CAPILLARY: 78 mg/dL (ref 70–99)
Glucose-Capillary: 85 mg/dL (ref 70–99)
Glucose-Capillary: 93 mg/dL (ref 70–99)
Glucose-Capillary: 68 mg/dL — ABNORMAL LOW (ref 70–99)

## 2018-07-11 MED ORDER — POLYETHYLENE GLYCOL 3350 17 G PO PACK
17.0000 g | PACK | Freq: Two times a day (BID) | ORAL | Status: DC
  Administered 2018-07-11 – 2018-07-12 (×3): 17 g via ORAL
  Filled 2018-07-11 (×3): qty 1

## 2018-07-11 MED ORDER — DOCUSATE SODIUM 100 MG PO CAPS
100.0000 mg | ORAL_CAPSULE | Freq: Two times a day (BID) | ORAL | Status: DC
  Administered 2018-07-11 – 2018-07-12 (×3): 100 mg via ORAL
  Filled 2018-07-11 (×3): qty 1

## 2018-07-11 NOTE — Progress Notes (Signed)
  Subjective: Patient had small bowel movement.  No abdominal pain.  Tolerating clear liquid diet.  No nausea or vomiting.  Objective: Vital signs in last 24 hours: Temp:  [98.3 F (36.8 C)-98.7 F (37.1 C)] 98.3 F (36.8 C) (11/01 0403) Pulse Rate:  [46-50] 46 (11/01 0403) Resp:  [18] 18 (11/01 0403) BP: (133-164)/(81) 133/81 (11/01 0403) SpO2:  [99 %-100 %] 100 % (11/01 0403) Weight:  [74 kg] 74 kg (11/01 0403) Last BM Date: 07/07/18  Intake/Output from previous day: 10/31 0701 - 11/01 0700 In: 2370.2 [P.O.:910; I.V.:1460.2] Out: 1500 [Urine:1500] Intake/Output this shift: No intake/output data recorded.  General appearance: alert, cooperative and no distress GI: soft, non-tender; bowel sounds normal; no masses,  no organomegaly  Lab Results:  Recent Labs    07/09/18 0456  WBC 6.7  HGB 12.2*  HCT 38.5*  PLT 224   BMET Recent Labs    07/09/18 0456  NA 143  K 3.7  CL 111  CO2 23  GLUCOSE 84  BUN 11  CREATININE 1.20  CALCIUM 9.4   PT/INR No results for input(s): LABPROT, INR in the last 72 hours.  Studies/Results: Dg Abd 2 Views  Result Date: 07/10/2018 CLINICAL DATA:  Constipation for 2 days. EXAM: ABDOMEN - 2 VIEW COMPARISON:  CT 07/08/2018 FINDINGS: Small bowel dilation has improved compared to the previous CT. Small-bowel remains mildly prominent and there are multiple air-fluid levels on the erect view. No free air. No acute skeletal or soft tissue abnormality. Minor atelectasis noted at the left lung base. IMPRESSION: 1. Improved partial small bowel obstruction 2. No free air. Electronically Signed   By: Amie Portland M.D.   On: 07/10/2018 09:15    Anti-infectives: Anti-infectives (From admission, onward)   None      Assessment/Plan: Impression: Partial small bowel obstruction, resolving Plan: No need for surgical intervention at this time.  Will advance diet as tolerated.  MiraLAX has been prescribed.  LOS: 3 days    Franky Macho 07/11/2018

## 2018-07-11 NOTE — Care Management Important Message (Signed)
Important Message  Patient Details  Name: [name] Boyle MRN: [id number] Date of Birth: 09-09-1951   Medicare Important Message Given:  Yes    Renie Ora 07/11/2018, 2:39 PM

## 2018-07-11 NOTE — Progress Notes (Signed)
PROGRESS NOTE    [name] Boyle  ZOX:[id number] DOB: March 26, 1951 DOA: 07/07/2018 PCP: Pearson Grippe, MD     Brief Narrative:  67 y.o. male, w dm2, hypertension, hyperlipidemia, w / h/o prior SBO apparently presents with c/o n/v x2 (no blood), and centralized abdominal pain.  Pt denies fever, chills, diarrhea, brbpr, black stool. Pt presented to ED due to n/v, and found to have SBO, presumably due to adhesions.   Assessment & Plan: 1-SBO/partial SBO -Appreciate general surgery consultation and recommendations -tolerated CLD; will advance to full liquid and start colace and miralax. -continue to replete electrolytes and maintain good hydration -patient continue to passed gas -follow clinical response. -no nausea, no vomiting, no abd pain. -continue increased activity   2-hypertension -Continue Vasotec and PRN hydralazine for now. -once fully tolerating PO's will resume home antihypertensive regimen.   3-GERD -continue IV PPI for now; restart PO meds when tolerating by mouth.  4-type 2 diabetes -continue holding oral hypoglycemic agents while inpatient -Follow CBGs and use a sliding scale insulin.  5-HLD -will resume statins at discharge.  DVT prophylaxis: Lovenox Code Status: Full code Family Communication: No family at bedside. Disposition Plan: Remain inpatient, continue conservative management, keep NG in place; continue electrolytes repletion.  Follow general surgery recommendations.  Consultants:   General surgery  Procedures:  See below for x-ray reports.  Antimicrobials:  Anti-infectives (From admission, onward)   None      Subjective: Afebrile, no CP, no SOB, no nausea, no vomiting. Continue to passed flatus and is tolerating CLD.  Objective: Vitals:   07/10/18 0457 07/10/18 1932 07/10/18 2140 07/11/18 0403  BP: (!) 164/84  (!) 164/81 133/81  Pulse: (!) 58  (!) 50 (!) 46  Resp: 18  18 18   Temp: 98.8 F (37.1 C)  98.7 F (37.1 C) 98.3 F (36.8 C)    TempSrc: Oral  Oral Oral  SpO2: 95% 99% 100% 100%  Weight: 74.7 kg   74 kg  Height:        Intake/Output Summary (Last 24 hours) at 07/11/2018 0935 Last data filed at 07/11/2018 0600 Gross per 24 hour  Intake 1395 ml  Output 1150 ml  Net 245 ml   Filed Weights   07/08/18 1427 07/10/18 0457 07/11/18 0403  Weight: 74.8 kg 74.7 kg 74 kg    Examination: General exam: Alert, awake, oriented x 3; no CP, no SOB, no nausea, no vomiting and no abd pain. Patient passing gas. Respiratory system: Clear to auscultation. Respiratory effort normal. Cardiovascular system:RRR. No murmurs, rubs, gallops. Gastrointestinal system: Abdomen is nondistended, soft and nontender. No organomegaly or masses felt. Normal bowel sounds heard. Central nervous system: Alert and oriented. No focal neurological deficits. Extremities: No C/C/E, +pedal pulses Skin: No rashes, lesions or ulcers Psychiatry: Judgement and insight appear normal. Mood & affect appropriate.   Data Reviewed: I have personally reviewed following labs and imaging studies  CBC: Recent Labs  Lab 07/07/18 2334 07/09/18 0456  WBC 9.9 6.7  NEUTROABS 8.7*  --   HGB 14.0 12.2*  HCT 43.7 38.5*  MCV 94.0 96.3  PLT 259 224   Basic Metabolic Panel: Recent Labs  Lab 07/07/18 2334 07/09/18 0456  NA 138 143  K 3.6 3.7  CL 104 111  CO2 23 23  GLUCOSE 131* 84  BUN 8 11  CREATININE 1.10 1.20  CALCIUM 9.8 9.4  MG  --  2.1   GFR: Estimated Creatinine Clearance: 55.4 mL/min (by C-G formula based on SCr of 1.2  mg/dL).   Liver Function Tests: Recent Labs  Lab 07/07/18 2334 07/09/18 0456  AST 27 19  ALT 22 18  ALKPHOS 50 46  BILITOT 1.0 1.6*  PROT 8.4* 7.0  ALBUMIN 4.8 4.1   Recent Labs  Lab 07/07/18 2334  LIPASE 62*   CBG: Recent Labs  Lab 07/10/18 1643 07/10/18 2002 07/10/18 2357 07/11/18 0400 07/11/18 0800  GLUCAP 88 89 100* 85 93    Radiology Studies: Dg Abd 2 Views  Result Date: 07/10/2018 CLINICAL DATA:   Constipation for 2 days. EXAM: ABDOMEN - 2 VIEW COMPARISON:  CT 07/08/2018 FINDINGS: Small bowel dilation has improved compared to the previous CT. Small-bowel remains mildly prominent and there are multiple air-fluid levels on the erect view. No free air. No acute skeletal or soft tissue abnormality. Minor atelectasis noted at the left lung base. IMPRESSION: 1. Improved partial small bowel obstruction 2. No free air. Electronically Signed   By: Amie Portland M.D.   On: 07/10/2018 09:15   Scheduled Meds: . azelastine  1 spray Each Nare BID  . docusate sodium  100 mg Oral BID  . enalaprilat  1.25 mg Intravenous Q6H  . enoxaparin (LOVENOX) injection  40 mg Subcutaneous Q24H  . fluticasone  2 spray Each Nare Daily  . insulin aspart  0-9 Units Subcutaneous Q4H  . pantoprazole  40 mg Oral Q1200  . polyethylene glycol  17 g Oral BID   Continuous Infusions: . sodium chloride Stopped (07/08/18 0349)     LOS: 3 days    Time spent: 30 minutes  Vassie Loll, MD [facility] Pager [phone]  If 7PM-7AM, please contact night-coverage [email or web address] Password [email or web address] 07/11/2018, 9:35 AM

## 2018-07-11 NOTE — Progress Notes (Signed)
Patient had one large mushy BM this afternoon.  Has been ambulating frequently, passing gas, and tolerating full liquids

## 2018-07-12 LAB — GLUCOSE, CAPILLARY
GLUCOSE-CAPILLARY: 93 mg/dL (ref 70–99)
Glucose-Capillary: 89 mg/dL (ref 70–99)
Glucose-Capillary: 91 mg/dL (ref 70–99)

## 2018-07-12 NOTE — Discharge Summary (Signed)
Physician Discharge Summary  Patient ID: Case Vassell MRN: [id number] DOB/AGE: [birth year] 67 y.o.  Admit date: 07/07/2018 Discharge date: 07/12/2018  Admission Diagnoses: Partial small bowel obstruction  Discharge Diagnoses: Same Principal Problem:   SBO (small bowel obstruction) [facility])   Discharged Condition: good  Hospital Course: Patient is a 67 year old black male who presented to the emergency room with abdominal distention, nausea, vomiting.  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  This was similar to an episode several months ago.  An NG tube was placed and the patient was admitted to the hospital for further evaluation and treatment.  Surgery was consulted.  There was no need for acute surgical intervention at that time.  His bowel function started to fully return, thus the NG tube was removed.  His diet was advanced without difficulty.  He is being discharged home on 07/12/2018 in good and improving condition.   Discharge Exam: Blood pressure 136/75, pulse (!) 52, temperature 97.7 F (36.5 C), temperature source Oral, resp. rate 18, height 5\' 5"  (1.651 m), weight 73.1 kg, SpO2 100 %. General appearance: alert, cooperative and no distress Resp: clear to auscultation bilaterally Cardio: regular rate and rhythm, S1, S2 normal, no murmur, click, rub or gallop GI: soft, non-tender; bowel sounds normal; no masses,  no organomegaly  Disposition: Discharge disposition: 01-Home or Self Care       Discharge Instructions    Diet - low sodium heart healthy   Complete by:  As directed    Increase activity slowly   Complete by:  As directed      Allergies as of 07/12/2018   No Known Allergies     Medication List    TAKE these medications   amLODipine 10 MG tablet Commonly known as:  NORVASC Take 10 mg by mouth daily.   aspirin 81 MG chewable tablet Chew by mouth daily.   atorvastatin 40 MG tablet Commonly known  as:  LIPITOR Take 40 mg by mouth daily.   azelastine 0.1 % nasal spray Commonly known as:  ASTELIN Place into both nostrils 2 (two) times daily. Use in each nostril as directed   co-enzyme Q-10 30 MG capsule Take 300 mg by mouth daily.   docusate sodium 50 MG capsule Commonly known as:  COLACE Take 50 mg by mouth 2 (two) times daily.   enalapril 10 MG tablet Commonly known as:  VASOTEC Take 10 mg by mouth 2 (two) times daily.   FISH OIL PO Take 1,400 mg by mouth.   fluticasone 50 MCG/ACT nasal spray Commonly known as:  FLONASE Place into both nostrils daily.   GLUCOS-CHONDROIT-MSM-C-HYAL PO Take by mouth.   HM FEXOFENADINE HCL 180 MG tablet Generic drug:  fexofenadine Take 180 mg by mouth daily.   lansoprazole 30 MG capsule Commonly known as:  PREVACID Take 30 mg by mouth daily at 12 noon.   metFORMIN 500 MG tablet Commonly known as:  GLUCOPHAGE Take 500 mg by mouth daily with breakfast.   multivitamin capsule Take 1 capsule by mouth daily.   pantoprazole 40 MG tablet Commonly known as:  PROTONIX Take 1 tablet (40 mg total) by mouth daily.   ranitidine 150 MG tablet Commonly known as:  ZANTAC Take 150 mg by mouth 2 (two) times daily.   sitaGLIPtin 100 MG tablet Commonly known as:  JANUVIA Take 100 mg by mouth daily.   STELARA 45 MG/0.5ML Soln Generic drug:  Ustekinumab Inject 0.5 mLs into the  skin once a week. Every 12 weeks. Due in January   VIMOVO 500-20 MG Tbec Generic drug:  Naproxen-Esomeprazole Take by mouth as needed.   vitamin C 1000 MG tablet Take 1,000 mg by mouth daily.   Vitamin D3 5000 units Caps Take 1 capsule by mouth daily.      Follow-up Information    Franky Macho, MD Follow up.   Specialty:  General Surgery Why:  as needed Contact information: [address] [place] [address] [phone]           Signed: Franky Macho 07/12/2018, 9:35 AM

## 2018-07-12 NOTE — Progress Notes (Signed)
Discharge to home, instructions given.

## 2018-07-17 ENCOUNTER — Inpatient Hospital Stay (HOSPITAL_COMMUNITY): Payer: Medicare Other

## 2018-07-17 ENCOUNTER — Other Ambulatory Visit: Payer: Self-pay

## 2018-07-17 ENCOUNTER — Encounter (HOSPITAL_COMMUNITY): Payer: Self-pay | Admitting: Emergency Medicine

## 2018-07-17 ENCOUNTER — Emergency Department (HOSPITAL_COMMUNITY): Payer: Medicare Other

## 2018-07-17 ENCOUNTER — Inpatient Hospital Stay (HOSPITAL_COMMUNITY)
Admission: EM | Admit: 2018-07-17 | Discharge: 2018-07-26 | DRG: 330 | Disposition: A | Discharge status: Home / Self Care | Payer: Medicare Other | Attending: Family Medicine | Admitting: Family Medicine

## 2018-07-17 DIAGNOSIS — Z7982 Long term (current) use of aspirin: Secondary | ICD-10-CM | POA: Diagnosis not present

## 2018-07-17 DIAGNOSIS — I10 Essential (primary) hypertension: Secondary | ICD-10-CM | POA: Diagnosis present

## 2018-07-17 DIAGNOSIS — Z79899 Other long term (current) drug therapy: Secondary | ICD-10-CM

## 2018-07-17 DIAGNOSIS — K91872 Postprocedural seroma of a digestive system organ or structure following a digestive system procedure: Secondary | ICD-10-CM | POA: Diagnosis not present

## 2018-07-17 DIAGNOSIS — K219 Gastro-esophageal reflux disease without esophagitis: Secondary | ICD-10-CM | POA: Diagnosis present

## 2018-07-17 DIAGNOSIS — Z0189 Encounter for other specified special examinations: Secondary | ICD-10-CM

## 2018-07-17 DIAGNOSIS — K5651 Intestinal adhesions [bands], with partial obstruction: Secondary | ICD-10-CM | POA: Diagnosis present

## 2018-07-17 DIAGNOSIS — E119 Type 2 diabetes mellitus without complications: Secondary | ICD-10-CM | POA: Diagnosis present

## 2018-07-17 DIAGNOSIS — E785 Hyperlipidemia, unspecified: Secondary | ICD-10-CM | POA: Diagnosis present

## 2018-07-17 DIAGNOSIS — K56609 Unspecified intestinal obstruction, unspecified as to partial versus complete obstruction: Secondary | ICD-10-CM | POA: Diagnosis present

## 2018-07-17 DIAGNOSIS — E7800 Pure hypercholesterolemia, unspecified: Secondary | ICD-10-CM | POA: Diagnosis present

## 2018-07-17 LAB — PHOSPHORUS: Phosphorus: 4.2 mg/dL (ref 2.5–4.6)

## 2018-07-17 LAB — URINALYSIS, ROUTINE W REFLEX MICROSCOPIC
Bacteria, UA: NONE SEEN
Bilirubin Urine: NEGATIVE
GLUCOSE, UA: NEGATIVE mg/dL
Hgb urine dipstick: NEGATIVE
Ketones, ur: NEGATIVE mg/dL
Leukocytes, UA: NEGATIVE
Nitrite: NEGATIVE
PROTEIN: 30 mg/dL — AB
Specific Gravity, Urine: 1.017 (ref 1.005–1.030)
pH: 5 (ref 5.0–8.0)

## 2018-07-17 LAB — CBC WITH DIFFERENTIAL/PLATELET
Abs Immature Granulocytes: 0.02 10*3/uL (ref 0.00–0.07)
Basophils Absolute: 0.1 10*3/uL (ref 0.0–0.1)
Basophils Relative: 1 %
EOS PCT: 0 %
Eosinophils Absolute: 0 10*3/uL (ref 0.0–0.5)
HCT: 41.7 % (ref 39.0–52.0)
HEMOGLOBIN: 13.5 g/dL (ref 13.0–17.0)
Immature Granulocytes: 0 %
LYMPHS PCT: 7 %
Lymphs Abs: 0.8 10*3/uL (ref 0.7–4.0)
MCH: 29.9 pg (ref 26.0–34.0)
MCHC: 32.4 g/dL (ref 30.0–36.0)
MCV: 92.3 fL (ref 80.0–100.0)
Monocytes Absolute: 0.6 10*3/uL (ref 0.1–1.0)
Monocytes Relative: 5 %
NEUTROS ABS: 9.9 10*3/uL — AB (ref 1.7–7.7)
Neutrophils Relative %: 87 %
PLATELETS: 276 10*3/uL (ref 150–400)
RBC: 4.52 MIL/uL (ref 4.22–5.81)
RDW: 13.8 % (ref 11.5–15.5)
WBC: 11.4 10*3/uL — AB (ref 4.0–10.5)
nRBC: 0 % (ref 0.0–0.2)

## 2018-07-17 LAB — COMPREHENSIVE METABOLIC PANEL
ALBUMIN: 4.7 g/dL (ref 3.5–5.0)
ALT: 22 U/L (ref 0–44)
AST: 25 U/L (ref 15–41)
Alkaline Phosphatase: 55 U/L (ref 38–126)
Anion gap: 10 (ref 5–15)
BUN: 9 mg/dL (ref 8–23)
CO2: 25 mmol/L (ref 22–32)
Calcium: 9.7 mg/dL (ref 8.9–10.3)
Chloride: 105 mmol/L (ref 98–111)
Creatinine, Ser: 1.16 mg/dL (ref 0.61–1.24)
Glucose, Bld: 131 mg/dL — ABNORMAL HIGH (ref 70–99)
Potassium: 3.5 mmol/L (ref 3.5–5.1)
Sodium: 140 mmol/L (ref 135–145)
Total Bilirubin: 1 mg/dL (ref 0.3–1.2)
Total Protein: 8.2 g/dL — ABNORMAL HIGH (ref 6.5–8.1)

## 2018-07-17 LAB — MAGNESIUM: Magnesium: 1.9 mg/dL (ref 1.7–2.4)

## 2018-07-17 LAB — LIPASE, BLOOD: Lipase: 67 U/L — ABNORMAL HIGH (ref 11–51)

## 2018-07-17 MED ORDER — HYDROMORPHONE HCL 1 MG/ML IJ SOLN
0.7500 mg | INTRAMUSCULAR | Status: DC | PRN
  Administered 2018-07-18: 0.75 mg via INTRAVENOUS
  Filled 2018-07-17: qty 1

## 2018-07-17 MED ORDER — HYDROMORPHONE HCL 1 MG/ML IJ SOLN
1.0000 mg | Freq: Once | INTRAMUSCULAR | Status: AC
  Administered 2018-07-17: 1 mg via INTRAVENOUS
  Filled 2018-07-17: qty 1

## 2018-07-17 MED ORDER — ONDANSETRON HCL 4 MG/2ML IJ SOLN: 4.0000 mg | Freq: Four times a day (QID) | INTRAMUSCULAR | Status: DC | PRN

## 2018-07-17 MED ORDER — SODIUM CHLORIDE 0.9 % IV BOLUS
1000.0000 mL | Freq: Once | INTRAVENOUS | Status: AC
  Administered 2018-07-17: 1000 mL via INTRAVENOUS

## 2018-07-17 MED ORDER — MORPHINE SULFATE (PF) 4 MG/ML IV SOLN
4.0000 mg | Freq: Once | INTRAVENOUS | Status: AC
  Administered 2018-07-17: 4 mg via INTRAVENOUS
  Filled 2018-07-17: qty 1

## 2018-07-17 MED ORDER — ENALAPRILAT 1.25 MG/ML IV SOLN
0.6250 mg | Freq: Four times a day (QID) | INTRAVENOUS | Status: DC
  Administered 2018-07-18 – 2018-07-19 (×7): 0.625 mg via INTRAVENOUS
  Filled 2018-07-17 (×7): qty 2

## 2018-07-17 MED ORDER — MAGNESIUM SULFATE 2 GM/50ML IV SOLN
2.0000 g | Freq: Once | INTRAVENOUS | Status: AC
  Administered 2018-07-18: 2 g via INTRAVENOUS
  Filled 2018-07-17: qty 50

## 2018-07-17 MED ORDER — FAMOTIDINE IN NACL 20-0.9 MG/50ML-% IV SOLN
20.0000 mg | Freq: Two times a day (BID) | INTRAVENOUS | Status: DC
  Administered 2018-07-18 – 2018-07-24 (×14): 20 mg via INTRAVENOUS
  Filled 2018-07-17 (×14): qty 50

## 2018-07-17 MED ORDER — ONDANSETRON HCL 4 MG PO TABS: 4.0000 mg | ORAL_TABLET | Freq: Four times a day (QID) | ORAL | Status: DC | PRN

## 2018-07-17 MED ORDER — ONDANSETRON HCL 4 MG/2ML IJ SOLN
4.0000 mg | Freq: Once | INTRAMUSCULAR | Status: AC
  Administered 2018-07-17: 4 mg via INTRAVENOUS
  Filled 2018-07-17: qty 2

## 2018-07-17 MED ORDER — AZELASTINE HCL 0.1 % NA SOLN
1.0000 | Freq: Every morning | NASAL | Status: DC
  Administered 2018-07-20 – 2018-07-26 (×7): 1 via NASAL
  Filled 2018-07-17: qty 30

## 2018-07-17 MED ORDER — HYDRALAZINE HCL 20 MG/ML IJ SOLN: 10.0000 mg | INTRAMUSCULAR | Status: DC | PRN

## 2018-07-17 MED ORDER — ACETAMINOPHEN 325 MG PO TABS: 650.0000 mg | ORAL_TABLET | Freq: Four times a day (QID) | ORAL | Status: DC | PRN

## 2018-07-17 MED ORDER — ACETAMINOPHEN 650 MG RE SUPP: 650.0000 mg | Freq: Four times a day (QID) | RECTAL | Status: DC | PRN

## 2018-07-17 MED ORDER — FLUTICASONE PROPIONATE 50 MCG/ACT NA SUSP
2.0000 | Freq: Every morning | NASAL | Status: DC
  Administered 2018-07-20 – 2018-07-26 (×7): 2 via NASAL
  Filled 2018-07-17: qty 16

## 2018-07-17 MED ORDER — POTASSIUM CHLORIDE IN NACL 40-0.9 MEQ/L-% IV SOLN
INTRAVENOUS | Status: DC
  Administered 2018-07-18 – 2018-07-19 (×4): 100 mL/h via INTRAVENOUS
  Administered 2018-07-20 – 2018-07-24 (×5): 50 mL/h via INTRAVENOUS
  Filled 2018-07-17 (×2): qty 1000

## 2018-07-17 MED ORDER — MORPHINE SULFATE (PF) 2 MG/ML IV SOLN: 2.0000 mg | INTRAVENOUS | Status: DC | PRN

## 2018-07-17 MED ORDER — POTASSIUM CHLORIDE IN NACL 20-0.9 MEQ/L-% IV SOLN
INTRAVENOUS | Status: DC
  Administered 2018-07-18: via INTRAVENOUS

## 2018-07-17 NOTE — H&P (Signed)
History and Physical    [name] Boyle WUJ:[id number] DOB: May 09, 1951 DOA: 07/17/2018  PCP: Pearson Grippe, MD   Patient coming from: Home.  I have personally briefly reviewed patient's old medical records in [facility]  Chief Complaint: Abdominal pain, nausea and vomiting.  HPI: [name] Boyle is a 67 y.o. male with medical history significant of type 2 diabetes, GERD, hyperlipidemia, hypertension, recent episode of SBO in late September and last month was admitted from 07/08/2018 to 07/12/2018 for SBO as well.  He returns today to the hospital after having several episodes of loose/soft stools yesterday, but no symptoms until this afternoon.  He says that he woke up this morning ate breakfast and lunch without any issues.  However, around 1600 he started having abdominal pain and abdominal distention.  This was followed by nausea and an episode of emesis at around 1900.  He denies constipation, melena or hematochezia.  He denies dysuria, frequency or hematuria.  He denies fever, chills, sore throat, wheezing, hemoptysis, chest pain, palpitations, dyspnea, diaphoresis, PND, orthopnea or pitting edema of the lower extremities.  No heat or cold intolerance.  No polyuria, polydipsia, polyphagia or blurred vision.  Denies skin pruritus.  ED Course: Initial vital signs temperature 98.2 F, pulse 91, respiration 19, blood pressure 146/90 mmHg and O2 sat 98% on room air.  He received morphine 4 mg IVP plus ondansetron 4 mg IVP along with a 1 L NS bolus with partial relief.  However he had to be given 1 mg of hydromorphone an hour plus after the morphine was administered due to recurrent pain.  Dr. Rise Mu discussed the case with Dr. Henreitta Leber who recommended NG tube placement and admission to the hospitalist service.  His urinalysis showed an amber color, with a hazy appearance and 30 mg/dL of protein.  All other values were normal.  White count is 11.4, hemoglobin 13.5 g/dL and platelets 956.  Lipase was 67  units/L.  CMP showed a glucose of 131 mg/dL and total protein 8.2 g/dL, but all other values were within normal limits.  Phosphorus 4.2 and magnesium was 1.9 mg/dL.  Imaging: Acute abdomen with chest radiographs showed slight interval worsening of small bowel obstruction.  There was no free air.  There was possible appearance of intramuscular air in the right lower quadrant.  CT was recommended for further evaluation.  Review of Systems: As per HPI otherwise 10 point review of systems negative.  Past Medical History:  Diagnosis Date  . Diabetes mellitus without complication (HCC)   . GERD (gastroesophageal reflux disease)   . High cholesterol   . Hypertension     Past Surgical History:  Procedure Laterality Date  . ABDOMINAL SURGERY    . KNEE SURGERY       reports that he has never smoked. He has never used smokeless tobacco. He reports that he drinks alcohol. His drug history is not on file.  No Known Allergies  Family History  Problem Relation Age of Onset  . Diabetes Father   . Diabetes Maternal Grandfather    Prior to Admission medications   Medication Sig Start Date End Date Taking? Authorizing Provider  amLODipine (NORVASC) 10 MG tablet Take 10 mg by mouth every morning.    Yes [provider]  amoxicillin (AMOXIL) 500 MG capsule Take 500 mg by mouth 3 (three) times daily. 3 day course starting on 07/16/2018   Yes [provider]  Ascorbic Acid (VITAMIN C) 1000 MG tablet Take 1,000 mg by mouth every  morning.    Yes [provider]  aspirin 81 MG tablet Take 81 mg by mouth every morning.    Yes [provider]  atorvastatin (LIPITOR) 40 MG tablet Take 40 mg by mouth every evening.    Yes [provider]  azelastine (ASTELIN) 0.1 % nasal spray Place 1 spray into both nostrils every morning. Use in each nostril as directed-May use in the evening as needed   Yes [provider]  B-COMPLEX-C PO Take 1 capsule by mouth every  morning.   Yes [provider]  Cholecalciferol (VITAMIN D3) 5000 units CAPS Take 1 capsule by mouth every morning.    Yes [provider]  co-enzyme Q-10 30 MG capsule Take 300 mg by mouth every morning.    Yes [provider]  enalapril (VASOTEC) 10 MG tablet Take 10 mg by mouth 2 (two) times daily.   Yes [provider]  fexofenadine (HM FEXOFENADINE HCL) 180 MG tablet Take 180 mg by mouth every morning.    Yes [provider]  fluticasone (FLONASE) 50 MCG/ACT nasal spray Place 2 sprays into both nostrils every morning.    Yes [provider]  GLUCOS-CHONDROIT-MSM-C-HYAL PO Take 1 tablet by mouth every morning.    Yes [provider]  metFORMIN (GLUCOPHAGE) 500 MG tablet Take 500 mg by mouth every evening.    Yes [provider]  Multiple Vitamin (MULTIVITAMIN) capsule Take 1 capsule by mouth every morning.    Yes [provider]  Omega-3 Fatty Acids (FISH OIL PO) Take 1,400 mg by mouth every morning.    Yes [provider]  pantoprazole (PROTONIX) 40 MG tablet Take 1 tablet (40 mg total) by mouth daily. Patient taking differently: Take 40 mg by mouth every morning.  06/19/18  Yes Setzer, Brand Males, NP  ranitidine (ZANTAC) 150 MG tablet Take 150 mg by mouth daily as needed for heartburn.    Yes [provider]  sennosides-docusate sodium (SENOKOT-S) 8.6-50 MG tablet Take 2 tablets by mouth at bedtime.   Yes [provider]  Ustekinumab (STELARA) 45 MG/0.5ML SOLN Inject 0.5 mLs into the skin once a week. Every 12 weeks. Due in January   Yes [provider]    Physical Exam: Vitals:   07/17/18 1955 07/17/18 1956 07/17/18 2150 07/17/18 2257  BP: (!) 146/90   138/72  Pulse: 91   69  Resp: 19     Temp: 98.2 F (36.8 C)   98.3 F (36.8 C)  TempSrc: Oral   Oral  SpO2: 98%  98% 95%  Weight:  74.8 kg    Height:  5\' 5"  (1.651 m)      Constitutional: NAD, calm, comfortable Eyes:  PERRL, lids and conjunctivae normal ENMT: Mucous membranes are mildly dry. Posterior pharynx clear of any exudate or lesions. Neck: Normal, supple, no masses, no thyromegaly Respiratory: Clear to auscultation bilaterally, no wheezing, no crackles. Normal respiratory effort. No accessory muscle use.  Cardiovascular: Regular rate and rhythm, no murmurs / rubs / gallops. No extremity edema. 2+ pedal pulses. No carotid bruits.  Abdomen: Distended.  Bowel sounds positive.  Positive epigastric tenderness, no guarding or rebound, no masses palpated. No hepatosplenomegaly.  Musculoskeletal: No clubbing / cyanosis.  Good ROM, no contractures. Normal muscle tone.  Skin: No rashes, lesions, ulcers. No induration Neurologic: CN 2-12 grossly intact. Sensation intact, DTR normal. Strength 5/5 in all 4.  Psychiatric: Normal judgment and insight. Alert and oriented x 3. Normal mood.  Labs on Admission: I have personally reviewed following labs and imaging studies  CBC: Recent Labs  Lab 07/17/18 2043  WBC 11.4*  NEUTROABS 9.9*  HGB 13.5  HCT 41.7  MCV 92.3  PLT 276   Basic Metabolic Panel: Recent Labs  Lab 07/17/18 2043  NA 140  K 3.5  CL 105  CO2 25  GLUCOSE 131*  BUN 9  CREATININE 1.16  CALCIUM 9.7  MG 1.9  PHOS 4.2   GFR: Estimated Creatinine Clearance: 57.6 mL/min (by C-G formula based on SCr of 1.16 mg/dL). Liver Function Tests: Recent Labs  Lab 07/17/18 2043  AST 25  ALT 22  ALKPHOS 55  BILITOT 1.0  PROT 8.2*  ALBUMIN 4.7   Recent Labs  Lab 07/17/18 2043  LIPASE 67*   No results for input(s): AMMONIA in the last 168 hours. Coagulation Profile: No results for input(s): INR, PROTIME in the last 168 hours. Cardiac Enzymes: No results for input(s): CKTOTAL, CKMB, CKMBINDEX, TROPONINI in the last 168 hours. BNP (last 3 results) No results for input(s): PROBNP in the last 8760 hours. HbA1C: No results for input(s): HGBA1C in the last 72 hours. CBG: Recent Labs  Lab  07/11/18 1633 07/11/18 2036 07/12/18 0011 07/12/18 0441 07/12/18 0743  GLUCAP 83 78 91 89 93   Lipid Profile: No results for input(s): CHOL, HDL, LDLCALC, TRIG, CHOLHDL, LDLDIRECT in the last 72 hours. Thyroid Function Tests: No results for input(s): TSH, T4TOTAL, FREET4, T3FREE, THYROIDAB in the last 72 hours. Anemia Panel: No results for input(s): VITAMINB12, FOLATE, FERRITIN, TIBC, IRON, RETICCTPCT in the last 72 hours. Urine analysis:    Component Value Date/Time   COLORURINE AMBER (A) 07/17/2018 2020   APPEARANCEUR HAZY (A) 07/17/2018 2020   LABSPEC 1.017 07/17/2018 2020   PHURINE 5.0 07/17/2018 2020   GLUCOSEU NEGATIVE 07/17/2018 2020   HGBUR NEGATIVE 07/17/2018 2020   BILIRUBINUR NEGATIVE 07/17/2018 2020   KETONESUR NEGATIVE 07/17/2018 2020   PROTEINUR 30 (A) 07/17/2018 2020   NITRITE NEGATIVE 07/17/2018 2020   LEUKOCYTESUR NEGATIVE 07/17/2018 2020    Radiological Exams on Admission: Dg Abd Acute W/chest  Result Date: 07/17/2018 CLINICAL DATA:  Bowel obstruction EXAM: DG ABDOMEN ACUTE W/ 1V CHEST COMPARISON:  07/10/2018, CT 07/08/2018 FINDINGS: Single-view chest demonstrates elevated left diaphragm with atelectasis at the left base. No pleural effusion. Stable cardiomediastinal silhouette with aortic atherosclerosis. Upright view abdomen shows no free air beneath the diaphragm. Interval worsening of small bowel dilatation, measuring up to 7.1 cm in length with increased fluid levels on upright exam consistent with ongoing bowel obstruction. Questionable appearance of intramural air in the right lower quadrant. IMPRESSION: 1. Slight interval worsening of small bowel obstruction. No free air. 2. Possible appearance of intramural air in the right lower quadrant. CT is recommended for further evaluation. Electronically Signed   By: Jasmine Pang M.D.   On: 07/17/2018 21:25    EKG: Independently reviewed.  Vent. rate 70 BPM PR interval * ms QRS duration 98 ms QT/QTc 400/432  ms P-R-T axes 58 -11 4 Sinus rhythm Abnormal R-wave progression, early transition Left ventricular hypertrophy  Assessment/Plan Principal Problem:   SBO (small bowel obstruction) (HCC) Admit to MedSurg/inpatient. Keep n.p.o. Continue NGT with low intermittent suction. Continue IV fluids with potassium supplementation. Analgesics as needed. Antiemetics as needed. General surgery to evaluate in the morning.  Active Problems:   Gastroesophageal reflux disease without esophagitis Famotidine 20 mg IVP every 12 hours.    Hypertension Enalapril 0.625 mg IVP every 6  hours. Hydralazine 10 mg IVP every 4 hours as needed for SBP more than 159 mmHg. Monitor blood pressure, renal function and electrolytes.    Type 2 diabetes mellitus (HCC) Currently n.p.o. CBG monitoring every 6 hours. Switch to before meals and at bedtime once clear for oral intake.    Hyperlipidemia Hold statin for now.    DVT prophylaxis: SCDs. Code Status: Full code. Family Communication: Disposition Plan: Admit for SBO treatment. Consults called: General surgery (Dr. Larae Grooms) Admission status: Inpatient/MedSurg.   Bobette Mo MD [facility] Pager [phone].  If 7PM-7AM, please contact night-coverage [email or web address] Password [email or web address]  07/17/2018, 11:30 PM

## 2018-07-17 NOTE — ED Provider Notes (Signed)
[facility] EMERGENCY DEPARTMENT Provider Note   CSN: [id number] Arrival date & time: 07/17/18  1948     History   Chief Complaint Chief Complaint  Patient presents with  . Abdominal Pain    HPI [name] Boyle is a 67 y.o. male.  Pt presents to the ED today with a possible SBO.  The pt has a hx of SBO and was d/c on 11/2 for the same.  The pt did not require surgery, SBO resolved with NG tube.  He said he felt good until this afternoon around 1600.  The pt said he started having abd pain and swelling.  He vomited around 1900 which prompted him to come here.  He did have a bowel movement at 1300 and ate breakfast and lunch today without any problems.     Past Medical History:  Diagnosis Date  . Diabetes mellitus without complication (HCC)   . GERD (gastroesophageal reflux disease)   . High cholesterol   . Hypertension     Patient Active Problem List   Diagnosis Date Noted  . SBO (small bowel obstruction) (HCC) 07/08/2018  . Gastroesophageal reflux disease without esophagitis 06/20/2018  . Small bowel obstruction (HCC) 06/09/2018    Past Surgical History:  Procedure Laterality Date  . ABDOMINAL SURGERY    . KNEE SURGERY          Home Medications    Prior to Admission medications   Medication Sig Start Date End Date Taking? Authorizing Provider  amLODipine (NORVASC) 10 MG tablet Take 10 mg by mouth every morning.    Yes [provider]  amoxicillin (AMOXIL) 500 MG capsule Take 500 mg by mouth 3 (three) times daily. 3 day course starting on 07/16/2018   Yes [provider]  Ascorbic Acid (VITAMIN C) 1000 MG tablet Take 1,000 mg by mouth every morning.    Yes [provider]  aspirin 81 MG tablet Take 81 mg by mouth every morning.    Yes [provider]  atorvastatin (LIPITOR) 40 MG tablet Take 40 mg by mouth every evening.    Yes [provider]  azelastine (ASTELIN) 0.1 % nasal spray Place 1 spray into both nostrils every  morning. Use in each nostril as directed-May use in the evening as needed   Yes [provider]  B-COMPLEX-C PO Take 1 capsule by mouth every morning.   Yes [provider]  Cholecalciferol (VITAMIN D3) 5000 units CAPS Take 1 capsule by mouth every morning.    Yes [provider]  co-enzyme Q-10 30 MG capsule Take 300 mg by mouth every morning.    Yes [provider]  enalapril (VASOTEC) 10 MG tablet Take 10 mg by mouth 2 (two) times daily.   Yes [provider]  fexofenadine (HM FEXOFENADINE HCL) 180 MG tablet Take 180 mg by mouth every morning.    Yes [provider]  fluticasone (FLONASE) 50 MCG/ACT nasal spray Place 2 sprays into both nostrils every morning.    Yes [provider]  GLUCOS-CHONDROIT-MSM-C-HYAL PO Take 1 tablet by mouth every morning.    Yes [provider]  metFORMIN (GLUCOPHAGE) 500 MG tablet Take 500 mg by mouth every evening.    Yes [provider]  Multiple Vitamin (MULTIVITAMIN) capsule Take 1 capsule by mouth every morning.    Yes [provider]  Omega-3 Fatty Acids (FISH OIL PO) Take 1,400 mg by mouth every morning.    Yes [provider]  pantoprazole (PROTONIX) 40  MG tablet Take 1 tablet (40 mg total) by mouth daily. Patient taking differently: Take 40 mg by mouth every morning.  06/19/18  Yes Setzer, Brand Males, NP  ranitidine (ZANTAC) 150 MG tablet Take 150 mg by mouth daily as needed for heartburn.    Yes [provider]  sennosides-docusate sodium (SENOKOT-S) 8.6-50 MG tablet Take 2 tablets by mouth at bedtime.   Yes [provider]  Ustekinumab (STELARA) 45 MG/0.5ML SOLN Inject 0.5 mLs into the skin once a week. Every 12 weeks. Due in January   Yes [provider]    Family History Family History  Problem Relation Age of Onset  . Diabetes Father     Social History Social History   Tobacco Use  . Smoking status: Never Smoker  .  Smokeless tobacco: Never Used  Substance Use Topics  . Alcohol use: Yes    Frequency: Never    Comment: occasional  . Drug use: Not on file     Allergies   Patient has no known allergies.   Review of Systems Review of Systems  Gastrointestinal: Positive for abdominal pain, nausea and vomiting.  All other systems reviewed and are negative.    Physical Exam Updated Vital Signs BP (!) 146/90 (BP Location: Right Arm)   Pulse 91   Temp 98.2 F (36.8 C) (Oral)   Resp 19   Ht 5\' 5"  (1.651 m)   Wt 74.8 kg   SpO2 98%   BMI 27.46 kg/m   Physical Exam  Constitutional: He is oriented to person, place, and time. He appears well-developed and well-nourished.  HENT:  Head: Normocephalic and atraumatic.  Mouth/Throat: Oropharynx is clear and moist.  Eyes: Pupils are equal, round, and reactive to light. EOM are normal.  Cardiovascular: Normal rate, regular rhythm, normal heart sounds and intact distal pulses.  Pulmonary/Chest: Effort normal and breath sounds normal.  Abdominal: Normal appearance and bowel sounds are normal. He exhibits distension. There is generalized tenderness.  Neurological: He is alert and oriented to person, place, and time.  Skin: Skin is warm and dry. Capillary refill takes less than 2 seconds.  Psychiatric: He has a normal mood and affect. His behavior is normal.  Nursing note and vitals reviewed.    ED Treatments / Results  Labs (all labs ordered are listed, but only abnormal results are displayed) Labs Reviewed  CBC WITH DIFFERENTIAL/PLATELET - Abnormal; Notable for the following components:      Result Value   WBC 11.4 (*)    Neutro Abs 9.9 (*)    All other components within normal limits  COMPREHENSIVE METABOLIC PANEL - Abnormal; Notable for the following components:   Glucose, Bld 131 (*)    Total Protein 8.2 (*)    All other components within normal limits  LIPASE, BLOOD - Abnormal; Notable for the following components:   Lipase 67 (*)     All other components within normal limits  URINALYSIS, ROUTINE W REFLEX MICROSCOPIC - Abnormal; Notable for the following components:   Color, Urine AMBER (*)    APPearance HAZY (*)    Protein, ur 30 (*)    All other components within normal limits    EKG None  Radiology Dg Abd Acute W/chest  Result Date: 07/17/2018 CLINICAL DATA:  Bowel obstruction EXAM: DG ABDOMEN ACUTE W/ 1V CHEST COMPARISON:  07/10/2018, CT 07/08/2018 FINDINGS: Single-view chest demonstrates elevated left diaphragm with atelectasis at the left base. No pleural effusion. Stable cardiomediastinal silhouette with aortic atherosclerosis. Upright view  abdomen shows no free air beneath the diaphragm. Interval worsening of small bowel dilatation, measuring up to 7.1 cm in length with increased fluid levels on upright exam consistent with ongoing bowel obstruction. Questionable appearance of intramural air in the right lower quadrant. IMPRESSION: 1. Slight interval worsening of small bowel obstruction. No free air. 2. Possible appearance of intramural air in the right lower quadrant. CT is recommended for further evaluation. Electronically Signed   By: Jasmine Pang M.D.   On: 07/17/2018 21:25    Procedures Procedures (including critical care time)  Medications Ordered in ED Medications  HYDROmorphone (DILAUDID) injection 1 mg (has no administration in time range)  morphine 4 MG/ML injection 4 mg (4 mg Intravenous Given 07/17/18 2038)  ondansetron (ZOFRAN) injection 4 mg (4 mg Intravenous Given 07/17/18 2039)  sodium chloride 0.9 % bolus 1,000 mL (1,000 mLs Intravenous New Bag/Given 07/17/18 2039)     Initial Impression / Assessment and Plan / ED Course  I have reviewed the triage vital signs and the nursing notes.  Pertinent labs & imaging results that were available during my care of the patient were reviewed by me and considered in my medical decision making (see chart for details).    Pt has another SBO.  An NG was  placed.  Pt d/w Dr. Henreitta Leber (surgery) who will see him in the morning.  Pt d/w Dr. Robb Matar ([facility]) for admission.   Final Clinical Impressions(s) / ED Diagnoses   Final diagnoses:  SBO (small bowel obstruction) [facility])    ED Discharge Orders    None       Jacalyn Lefevre, MD 07/17/18 2147

## 2018-07-17 NOTE — ED Triage Notes (Signed)
Pt c/o abd pain since 1600 today and vomited once at 1900, denies fever and diarrhea, pt states he has been seen for same a few times

## 2018-07-18 ENCOUNTER — Encounter (HOSPITAL_COMMUNITY): Payer: Self-pay | Admitting: General Surgery

## 2018-07-18 ENCOUNTER — Inpatient Hospital Stay (HOSPITAL_COMMUNITY): Payer: Medicare Other

## 2018-07-18 DIAGNOSIS — K5651 Intestinal adhesions [bands], with partial obstruction: Secondary | ICD-10-CM

## 2018-07-18 DIAGNOSIS — K219 Gastro-esophageal reflux disease without esophagitis: Secondary | ICD-10-CM

## 2018-07-18 DIAGNOSIS — K56609 Unspecified intestinal obstruction, unspecified as to partial versus complete obstruction: Secondary | ICD-10-CM

## 2018-07-18 DIAGNOSIS — I10 Essential (primary) hypertension: Secondary | ICD-10-CM

## 2018-07-18 LAB — BASIC METABOLIC PANEL
ANION GAP: 5 (ref 5–15)
BUN: 8 mg/dL (ref 8–23)
CO2: 25 mmol/L (ref 22–32)
Calcium: 8.6 mg/dL — ABNORMAL LOW (ref 8.9–10.3)
Chloride: 111 mmol/L (ref 98–111)
Creatinine, Ser: 1.03 mg/dL (ref 0.61–1.24)
GFR calc Af Amer: >60 mL/min (ref >60)
Glucose, Bld: 102 mg/dL — ABNORMAL HIGH (ref 70–99)
POTASSIUM: 3.9 mmol/L (ref 3.5–5.1)
Sodium: 141 mmol/L (ref 135–145)

## 2018-07-18 LAB — CBC WITH DIFFERENTIAL/PLATELET
Abs Immature Granulocytes: 0.02 10*3/uL (ref 0.00–0.07)
Basophils Absolute: 0 10*3/uL (ref 0.0–0.1)
Basophils Relative: 0 %
Eosinophils Absolute: 0.1 10*3/uL (ref 0.0–0.5)
Eosinophils Relative: 1 %
HCT: 35 % — ABNORMAL LOW (ref 39.0–52.0)
Hemoglobin: 11.1 g/dL — ABNORMAL LOW (ref 13.0–17.0)
Immature Granulocytes: 0 %
Lymphocytes Relative: 21 %
Lymphs Abs: 1.3 10*3/uL (ref 0.7–4.0)
MCH: 30 pg (ref 26.0–34.0)
MCHC: 31.7 g/dL (ref 30.0–36.0)
MCV: 94.6 fL (ref 80.0–100.0)
Monocytes Absolute: 0.6 10*3/uL (ref 0.1–1.0)
Monocytes Relative: 10 %
Neutro Abs: 4.1 10*3/uL (ref 1.7–7.7)
Neutrophils Relative %: 68 %
Platelets: 225 10*3/uL (ref 150–400)
RBC: 3.7 MIL/uL — ABNORMAL LOW (ref 4.22–5.81)
RDW: 13.9 % (ref 11.5–15.5)
WBC: 6.2 10*3/uL (ref 4.0–10.5)
nRBC: 0 % (ref 0.0–0.2)

## 2018-07-18 LAB — GLUCOSE, CAPILLARY
GLUCOSE-CAPILLARY: 108 mg/dL — AB (ref 70–99)
GLUCOSE-CAPILLARY: 122 mg/dL — AB (ref 70–99)
Glucose-Capillary: 83 mg/dL (ref 70–99)

## 2018-07-18 MED ORDER — HYDROMORPHONE HCL 1 MG/ML IJ SOLN
1.0000 mg | Freq: Once | INTRAMUSCULAR | Status: AC
  Administered 2018-07-18: 1 mg via INTRAVENOUS
  Filled 2018-07-18: qty 1

## 2018-07-18 MED ORDER — IOPAMIDOL (ISOVUE-300) INJECTION 61%
100.0000 mL | Freq: Once | INTRAVENOUS | Status: AC | PRN
  Administered 2018-07-18: 100 mL via INTRAVENOUS

## 2018-07-18 MED ORDER — HYDROMORPHONE HCL 1 MG/ML IJ SOLN: 0.2500 mg | INTRAMUSCULAR | Status: DC | PRN

## 2018-07-18 NOTE — Consult Note (Signed)
[facility] Consult  Reason for Consult: SBO  Referring Physician: Dr. Wynetta Emery   Chief Complaint    Abdominal Pain      [name] Boyle is a 67 y.o. male.  HPI: [name] Boyle is a very pleasant 67 yo with HTN, DM, HLD who has come in 3 times in the last month with a SBO. He has been managed with NPO and NG tube and has started to have BMs but then returns with abdominal pain and nausea/vomiting. He was just released from the hospital a few days ago, and returns again. Xray done in the ED demonstrated dilated loops of bowel. He reports the last BM at 1pm that day, but started to have pain at about 7pm.  He says that he had his first and only abdominal surgery in [facility] for "twisting" of his intestines. He had no prior surgeries and says that they untwisted the intestines and did not have to do any resection.  He denies recalling any terms like malrotation or volvulus.    The last 2 times he has been at the hospital he has had some improvement, and was told if this continues to happen that surgery will be indicated.  He understands this need.   Past Medical History:  Diagnosis Date  . Diabetes mellitus without complication ([place])   . GERD (gastroesophageal reflux disease)   . High cholesterol   . Hypertension     Past Surgical History:  Procedure Laterality Date  . [facility]  [facility]2017   [facility]   . KNEE SURGERY      Family History  Problem Relation Age of Onset  . Diabetes Father   . Diabetes Maternal Grandfather     Social History   Tobacco Use  . Smoking status: Never Smoker  . Smokeless tobacco: Never Used  Substance Use Topics  . Alcohol use: Yes    Frequency: Never    Comment: occasional  . Drug use: Not on file    Medications:  I have reviewed the patient's current medications. Prior to Admission:  Medications Prior to Admission  Medication Sig Dispense Refill Last Dose  . amLODipine (NORVASC)  10 MG tablet Take 10 mg by mouth every morning.    07/17/2018 at Unknown time  . amoxicillin (AMOXIL) 500 MG capsule Take 500 mg by mouth 3 (three) times daily. 3 day course starting on 07/16/2018   07/17/2018 at Unknown time  . Ascorbic Acid (VITAMIN C) 1000 MG tablet Take 1,000 mg by mouth every morning.    07/17/2018 at Unknown time  . aspirin 81 MG tablet Take 81 mg by mouth every morning.    07/17/2018 at Unknown time  . atorvastatin (LIPITOR) 40 MG tablet Take 40 mg by mouth every evening.    07/16/2018 at Unknown time  . azelastine (ASTELIN) 0.1 % nasal spray Place 1 spray into both nostrils every morning. Use in each nostril as directed-May use in the evening as needed   07/17/2018 at Unknown time  . B-COMPLEX-C PO Take 1 capsule by mouth every morning.   07/17/2018 at Unknown time  . Cholecalciferol (VITAMIN D3) 5000 units CAPS Take 1 capsule by mouth every morning.    07/17/2018 at Unknown time  . co-enzyme Q-10 30 MG capsule Take 300 mg by mouth every morning.    07/17/2018 at Unknown time  . enalapril (VASOTEC) 10 MG tablet Take 10 mg by mouth 2 (two) times daily.   07/17/2018 at Unknown  time  . fexofenadine (HM FEXOFENADINE HCL) 180 MG tablet Take 180 mg by mouth every morning.    07/17/2018 at Unknown time  . fluticasone (FLONASE) 50 MCG/ACT nasal spray Place 2 sprays into both nostrils every morning.    07/17/2018 at Unknown time  . GLUCOS-CHONDROIT-MSM-C-HYAL PO Take 1 tablet by mouth every morning.    07/17/2018 at Unknown time  . metFORMIN (GLUCOPHAGE) 500 MG tablet Take 500 mg by mouth every evening.    07/16/2018 at Unknown time  . Multiple Vitamin (MULTIVITAMIN) capsule Take 1 capsule by mouth every morning.    07/17/2018 at Unknown time  . Omega-3 Fatty Acids (FISH OIL PO) Take 1,400 mg by mouth every morning.    07/17/2018 at Unknown time  . pantoprazole (PROTONIX) 40 MG tablet Take 1 tablet (40 mg total) by mouth daily. (Patient taking differently: Take 40 mg by mouth every morning. ) 90  tablet 3 07/17/2018 at Unknown time  . ranitidine (ZANTAC) 150 MG tablet Take 150 mg by mouth daily as needed for heartburn.    07/17/2018 at Unknown time  . sennosides-docusate sodium (SENOKOT-S) 8.6-50 MG tablet Take 2 tablets by mouth at bedtime.   07/16/2018 at Unknown time  . Ustekinumab (STELARA) 45 MG/0.5ML SOLN Inject 0.5 mLs into the skin once a week. Every 12 weeks. Due in January   Taking   Scheduled: . azelastine  1 spray Each Nare q morning - 10a  . enalaprilat  0.625 mg Intravenous Q6H  . fluticasone  2 spray Each Nare q morning - 10a   Continuous: . 0.9 % NaCl with KCl 40 mEq / L 100 mL/hr at 07/18/18 0400  . famotidine (PEPCID) IV 20 mg (07/18/18 1114)   WEX:[id number] **OR** acetaminophen, hydrALAZINE, HYDROmorphone (DILAUDID) injection, ondansetron **OR** ondansetron (ZOFRAN) IV  Allergies: No Known Allergies   ROS:  A comprehensive review of systems was negative except for: Gastrointestinal: positive for abdominal pain, nausea and vomiting  Blood pressure (!) 141/82, pulse (!) 54, temperature 98.2 F (36.8 C), temperature source Oral, resp. rate 19, height _0  (1.651 m), weight 74.8 kg, SpO2 95 %. Physical Exam  Constitutional: He is oriented to person, place, and time. He appears well-developed and well-nourished.  HENT:  Head: Normocephalic and atraumatic.  Eyes: Pupils are equal, round, and reactive to light. EOM are normal.  Cardiovascular: Normal rate.  Pulmonary/Chest: Effort normal.  Abdominal: Soft. Normal appearance. He exhibits distension. There is no tenderness. There is no rebound and no guarding. No hernia.  Vertical midline incision healed  Musculoskeletal:  No edema, moves all extremities  Neurological: He is alert and oriented to person, place, and time.  Skin: Skin is warm and dry.  Psychiatric: He has a normal mood and affect. His behavior is normal.  Vitals reviewed.   Results: Results for orders placed or performed during the  hospital encounter of 07/17/18 (from the past 48 hour(s))  Urinalysis, Routine w reflex microscopic     Status: Abnormal   Collection Time: 07/17/18  8:20 PM  Result Value Ref Range   Color, Urine AMBER (A) YELLOW    Comment: BIOCHEMICALS MAY BE AFFECTED BY COLOR   APPearance HAZY (A) CLEAR   Specific Gravity, Urine 1.017 1.005 - 1.030   pH 5.0 5.0 - 8.0   Glucose, UA NEGATIVE NEGATIVE mg/dL   Hgb urine dipstick NEGATIVE NEGATIVE   Bilirubin Urine NEGATIVE NEGATIVE   Ketones, ur NEGATIVE NEGATIVE mg/dL   Protein, ur 30 (A) NEGATIVE mg/dL  Nitrite NEGATIVE NEGATIVE   Leukocytes, UA NEGATIVE NEGATIVE   RBC / HPF 0-5 0 - 5 RBC/hpf   WBC, UA 0-5 0 - 5 WBC/hpf   Bacteria, UA NONE SEEN NONE SEEN   Mucus PRESENT    Hyaline Casts, UA PRESENT     Comment: Performed at [facility], [address] [place], [place] [address]  CBC with Differential     Status: Abnormal   Collection Time: 07/17/18  8:43 PM  Result Value Ref Range   WBC 11.4 (H) 4.0 - 10.5 K/uL   RBC 4.52 4.22 - 5.81 MIL/uL   Hemoglobin 13.5 13.0 - 17.0 g/dL   HCT 41.7 39.0 - 52.0 %   MCV 92.3 80.0 - 100.0 fL   MCH 29.9 26.0 - 34.0 pg   MCHC 32.4 30.0 - 36.0 g/dL   RDW 13.8 11.5 - 15.5 %   Platelets 276 150 - 400 K/uL   nRBC 0.0 0.0 - 0.2 %   Neutrophils Relative % 87 %   Neutro Abs 9.9 (H) 1.7 - 7.7 K/uL   Lymphocytes Relative 7 %   Lymphs Abs 0.8 0.7 - 4.0 K/uL   Monocytes Relative 5 %   Monocytes Absolute 0.6 0.1 - 1.0 K/uL   Eosinophils Relative 0 %   Eosinophils Absolute 0.0 0.0 - 0.5 K/uL   Basophils Relative 1 %   Basophils Absolute 0.1 0.0 - 0.1 K/uL   Immature Granulocytes 0 %   Abs Immature Granulocytes 0.02 0.00 - 0.07 K/uL    Comment: Performed at [facility], [address], [place], [place] [address]  Comprehensive metabolic panel     Status: Abnormal   Collection Time: 07/17/18  8:43 PM  Result Value Ref Range   Sodium 140 135 - 145 mmol/L   Potassium 3.5 3.5 - 5.1 mmol/L   Chloride 105 98 - 111  mmol/L   CO2 25 22 - 32 mmol/L   Glucose, Bld 131 (H) 70 - 99 mg/dL   BUN 9 8 - 23 mg/dL   Creatinine, Ser 1.16 0.61 - 1.24 mg/dL   Calcium 9.7 8.9 - 10.3 mg/dL   Total Protein 8.2 (H) 6.5 - 8.1 g/dL   Albumin 4.7 3.5 - 5.0 g/dL   AST 25 15 - 41 U/L   ALT 22 0 - 44 U/L   Alkaline Phosphatase 55 38 - 126 U/L   Total Bilirubin 1.0 0.3 - 1.2 mg/dL   GFR calc non Af Amer >60 >60 mL/min   GFR calc Af Amer >60 >60 mL/min    Comment: (NOTE) The eGFR has been calculated using the CKD EPI equation. This calculation has not been validated in all clinical situations. eGFR's persistently <60 mL/min signify possible Chronic Kidney Disease.    Anion gap 10 5 - 15    Comment: Performed at [facility], [address], [place], [place] [address]  Lipase, blood     Status: Abnormal   Collection Time: 07/17/18  8:43 PM  Result Value Ref Range   Lipase 67 (H) 11 - 51 U/L    Comment: Performed at [facility], [address] [place], [place] [address]  Magnesium     Status: None   Collection Time: 07/17/18  8:43 PM  Result Value Ref Range   Magnesium 1.9 1.7 - 2.4 mg/dL    Comment: Performed at [facility], [address], [place], [place] [address]  Phosphorus     Status: None   Collection Time: 07/17/18  8:43  PM  Result Value Ref Range   Phosphorus 4.2 2.5 - 4.6 mg/dL    Comment: Performed at [facility], [address], [place], [place] [address]  Glucose, capillary     Status: Abnormal   Collection Time: 07/18/18 12:30 AM  Result Value Ref Range   Glucose-Capillary 122 (H) 70 - 99 mg/dL  CBC WITH DIFFERENTIAL     Status: Abnormal   Collection Time: 07/18/18  4:56 AM  Result Value Ref Range   WBC 6.2 4.0 - 10.5 K/uL   RBC 3.70 (L) 4.22 - 5.81 MIL/uL   Hemoglobin 11.1 (L) 13.0 - 17.0 g/dL   HCT 35.0 (L) 39.0 - 52.0 %   MCV 94.6 80.0 - 100.0 fL   MCH 30.0 26.0 - 34.0 pg   MCHC 31.7 30.0 - 36.0 g/dL   RDW 13.9 11.5 - 15.5 %   Platelets 225 150 - 400 K/uL   nRBC 0.0 0.0 - 0.2 %    Neutrophils Relative % 68 %   Neutro Abs 4.1 1.7 - 7.7 K/uL   Lymphocytes Relative 21 %   Lymphs Abs 1.3 0.7 - 4.0 K/uL   Monocytes Relative 10 %   Monocytes Absolute 0.6 0.1 - 1.0 K/uL   Eosinophils Relative 1 %   Eosinophils Absolute 0.1 0.0 - 0.5 K/uL   Basophils Relative 0 %   Basophils Absolute 0.0 0.0 - 0.1 K/uL   Immature Granulocytes 0 %   Abs Immature Granulocytes 0.02 0.00 - 0.07 K/uL    Comment: Performed at [facility], [address] [place], [place] [address]  Basic metabolic panel     Status: Abnormal   Collection Time: 07/18/18  4:56 AM  Result Value Ref Range   Sodium 141 135 - 145 mmol/L   Potassium 3.9 3.5 - 5.1 mmol/L   Chloride 111 98 - 111 mmol/L   CO2 25 22 - 32 mmol/L   Glucose, Bld 102 (H) 70 - 99 mg/dL   BUN 8 8 - 23 mg/dL   Creatinine, Ser 1.03 0.61 - 1.24 mg/dL   Calcium 8.6 (L) 8.9 - 10.3 mg/dL   GFR calc non Af Amer >60 >60 mL/min   GFR calc Af Amer >60 >60 mL/min    Comment: (NOTE) The eGFR has been calculated using the CKD EPI equation. This calculation has not been validated in all clinical situations. eGFR's persistently <60 mL/min signify possible Chronic Kidney Disease.    Anion gap 5 5 - 15    Comment: Performed at [facility], [address], [place], [place] [address]  Glucose, capillary     Status: Abnormal   Collection Time: 07/18/18  6:02 AM  Result Value Ref Range   Glucose-Capillary 108 (H) 70 - 99 mg/dL  Glucose, capillary     Status: None   Collection Time: 07/18/18 11:45 AM  Result Value Ref Range   Glucose-Capillary 83 70 - 99 mg/dL   Personally reviewed Xray and CT from 9/30 and 10/29- Also reviewed with radiology, Dr. 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 Poorly visible esophageal tube tip in the left upper quadrant. Lung fields are clear.  Heart size is stable. Aortic atherosclerosis. IMPRESSION: Difficult visualization of esophageal tube, tip appears to be in the left upper quadrant. Electronically Signed   By: Donavan Foil M.D.   On: 07/18/2018 00:08   Dg Abd Acute W/chest  Result Date: 07/17/2018 CLINICAL DATA:  Bowel obstruction EXAM: DG ABDOMEN ACUTE W/ 1V CHEST COMPARISON:  07/10/2018, CT 07/08/2018 FINDINGS: Single-view chest demonstrates elevated left diaphragm with atelectasis at the left base. No pleural effusion. Stable cardiomediastinal silhouette with aortic atherosclerosis. Upright view abdomen shows no free air beneath the diaphragm. Interval worsening of small bowel dilatation, measuring up to 7.1 cm in length with increased fluid levels on upright exam consistent with ongoing bowel obstruction. Questionable appearance of intramural air in the right lower quadrant. IMPRESSION: 1. Slight interval worsening of small bowel obstruction. No free air. 2. Possible appearance of intramural air in the right lower quadrant. CT is recommended for further evaluation. Electronically Signed   By: Donavan Foil M.D.   On: 07/17/2018 21:25     Assessment & Plan:  [name] Boyle is a 67 y.o. 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 He has opened up but continues to re-obstruct, so we will plan for surgery.  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 We have discussed this and he understands.   -CT a/p today to evaluate for any changes given  history -NG, NPO for now, if has BMs/ NG falls out can leave NG out for comfort  -Plan for Ex lap, lysis of adhesion possible resection on Monday   All questions were answered  to the satisfaction of the patient.  Updated Dr. Wynetta Emery.   Virl Cagey 07/18/2018, 1:11 PM

## 2018-07-18 NOTE — H&P (View-Only) (Signed)
[facility] Consult  Reason for Consult: SBO  Referring Physician: Dr. Wynetta Emery   Chief Complaint    Abdominal Pain      [name] Boyle is a 67 y.o. male.  HPI: [name] Boyle is a very pleasant 67 yo with HTN, DM, HLD who has come in 3 times in the last month with a SBO. He has been managed with NPO and NG tube and has started to have BMs but then returns with abdominal pain and nausea/vomiting. He was just released from the hospital a few days ago, and returns again. Xray done in the ED demonstrated dilated loops of bowel. He reports the last BM at 1pm that day, but started to have pain at about 7pm.  He says that he had his first and only abdominal surgery in [facility] for "twisting" of his intestines. He had no prior surgeries and says that they untwisted the intestines and did not have to do any resection.  He denies recalling any terms like malrotation or volvulus.    The last 2 times he has been at the hospital he has had some improvement, and was told if this continues to happen that surgery will be indicated.  He understands this need.   Past Medical History:  Diagnosis Date  . Diabetes mellitus without complication ([place])   . GERD (gastroesophageal reflux disease)   . High cholesterol   . Hypertension     Past Surgical History:  Procedure Laterality Date  . [facility]  07[facility]2017   [facility]   . KNEE SURGERY      Family History  Problem Relation Age of Onset  . Diabetes Father   . Diabetes Maternal Grandfather     Social History   Tobacco Use  . Smoking status: Never Smoker  . Smokeless tobacco: Never Used  Substance Use Topics  . Alcohol use: Yes    Frequency: Never    Comment: occasional  . Drug use: Not on file    Medications:  I have reviewed the patient's current medications. Prior to Admission:  Medications Prior to Admission  Medication Sig Dispense Refill Last Dose  . amLODipine (NORVASC)  10 MG tablet Take 10 mg by mouth every morning.    07/17/2018 at Unknown time  . amoxicillin (AMOXIL) 500 MG capsule Take 500 mg by mouth 3 (three) times daily. 3 day course starting on 07/16/2018   07/17/2018 at Unknown time  . Ascorbic Acid (VITAMIN C) 1000 MG tablet Take 1,000 mg by mouth every morning.    07/17/2018 at Unknown time  . aspirin 81 MG tablet Take 81 mg by mouth every morning.    07/17/2018 at Unknown time  . atorvastatin (LIPITOR) 40 MG tablet Take 40 mg by mouth every evening.    07/16/2018 at Unknown time  . azelastine (ASTELIN) 0.1 % nasal spray Place 1 spray into both nostrils every morning. Use in each nostril as directed-May use in the evening as needed   07/17/2018 at Unknown time  . B-COMPLEX-C PO Take 1 capsule by mouth every morning.   07/17/2018 at Unknown time  . Cholecalciferol (VITAMIN D3) 5000 units CAPS Take 1 capsule by mouth every morning.    07/17/2018 at Unknown time  . co-enzyme Q-10 30 MG capsule Take 300 mg by mouth every morning.    07/17/2018 at Unknown time  . enalapril (VASOTEC) 10 MG tablet Take 10 mg by mouth 2 (two) times daily.   07/17/2018 at Unknown  time  . fexofenadine (HM FEXOFENADINE HCL) 180 MG tablet Take 180 mg by mouth every morning.    07/17/2018 at Unknown time  . fluticasone (FLONASE) 50 MCG/ACT nasal spray Place 2 sprays into both nostrils every morning.    07/17/2018 at Unknown time  . GLUCOS-CHONDROIT-MSM-C-HYAL PO Take 1 tablet by mouth every morning.    07/17/2018 at Unknown time  . metFORMIN (GLUCOPHAGE) 500 MG tablet Take 500 mg by mouth every evening.    07/16/2018 at Unknown time  . Multiple Vitamin (MULTIVITAMIN) capsule Take 1 capsule by mouth every morning.    07/17/2018 at Unknown time  . Omega-3 Fatty Acids (FISH OIL PO) Take 1,400 mg by mouth every morning.    07/17/2018 at Unknown time  . pantoprazole (PROTONIX) 40 MG tablet Take 1 tablet (40 mg total) by mouth daily. (Patient taking differently: Take 40 mg by mouth every morning. ) 90  tablet 3 07/17/2018 at Unknown time  . ranitidine (ZANTAC) 150 MG tablet Take 150 mg by mouth daily as needed for heartburn.    07/17/2018 at Unknown time  . sennosides-docusate sodium (SENOKOT-S) 8.6-50 MG tablet Take 2 tablets by mouth at bedtime.   07/16/2018 at Unknown time  . Ustekinumab (STELARA) 45 MG/0.5ML SOLN Inject 0.5 mLs into the skin once a week. Every 12 weeks. Due in January   Taking   Scheduled: . azelastine  1 spray Each Nare q morning - 10a  . enalaprilat  0.625 mg Intravenous Q6H  . fluticasone  2 spray Each Nare q morning - 10a   Continuous: . 0.9 % NaCl with KCl 40 mEq / L 100 mL/hr at 07/18/18 0400  . famotidine (PEPCID) IV 20 mg (07/18/18 1114)   WEX:[id number] **OR** acetaminophen, hydrALAZINE, HYDROmorphone (DILAUDID) injection, ondansetron **OR** ondansetron (ZOFRAN) IV  Allergies: No Known Allergies   ROS:  A comprehensive review of systems was negative except for: Gastrointestinal: positive for abdominal pain, nausea and vomiting  Blood pressure (!) 141/82, pulse (!) 54, temperature 98.2 F (36.8 C), temperature source Oral, resp. rate 19, height _0  (1.651 m), weight 74.8 kg, SpO2 95 %. Physical Exam  Constitutional: He is oriented to person, place, and time. He appears well-developed and well-nourished.  HENT:  Head: Normocephalic and atraumatic.  Eyes: Pupils are equal, round, and reactive to light. EOM are normal.  Cardiovascular: Normal rate.  Pulmonary/Chest: Effort normal.  Abdominal: Soft. Normal appearance. He exhibits distension. There is no tenderness. There is no rebound and no guarding. No hernia.  Vertical midline incision healed  Musculoskeletal:  No edema, moves all extremities  Neurological: He is alert and oriented to person, place, and time.  Skin: Skin is warm and dry.  Psychiatric: He has a normal mood and affect. His behavior is normal.  Vitals reviewed.   Results: Results for orders placed or performed during the  hospital encounter of 07/17/18 (from the past 48 hour(s))  Urinalysis, Routine w reflex microscopic     Status: Abnormal   Collection Time: 07/17/18  8:20 PM  Result Value Ref Range   Color, Urine AMBER (A) YELLOW    Comment: BIOCHEMICALS MAY BE AFFECTED BY COLOR   APPearance HAZY (A) CLEAR   Specific Gravity, Urine 1.017 1.005 - 1.030   pH 5.0 5.0 - 8.0   Glucose, UA NEGATIVE NEGATIVE mg/dL   Hgb urine dipstick NEGATIVE NEGATIVE   Bilirubin Urine NEGATIVE NEGATIVE   Ketones, ur NEGATIVE NEGATIVE mg/dL   Protein, ur 30 (A) NEGATIVE mg/dL  Nitrite NEGATIVE NEGATIVE   Leukocytes, UA NEGATIVE NEGATIVE   RBC / HPF 0-5 0 - 5 RBC/hpf   WBC, UA 0-5 0 - 5 WBC/hpf   Bacteria, UA NONE SEEN NONE SEEN   Mucus PRESENT    Hyaline Casts, UA PRESENT     Comment: Performed at [facility], [address] [place], [place] [address]  CBC with Differential     Status: Abnormal   Collection Time: 07/17/18  8:43 PM  Result Value Ref Range   WBC 11.4 (H) 4.0 - 10.5 K/uL   RBC 4.52 4.22 - 5.81 MIL/uL   Hemoglobin 13.5 13.0 - 17.0 g/dL   HCT 41.7 39.0 - 52.0 %   MCV 92.3 80.0 - 100.0 fL   MCH 29.9 26.0 - 34.0 pg   MCHC 32.4 30.0 - 36.0 g/dL   RDW 13.8 11.5 - 15.5 %   Platelets 276 150 - 400 K/uL   nRBC 0.0 0.0 - 0.2 %   Neutrophils Relative % 87 %   Neutro Abs 9.9 (H) 1.7 - 7.7 K/uL   Lymphocytes Relative 7 %   Lymphs Abs 0.8 0.7 - 4.0 K/uL   Monocytes Relative 5 %   Monocytes Absolute 0.6 0.1 - 1.0 K/uL   Eosinophils Relative 0 %   Eosinophils Absolute 0.0 0.0 - 0.5 K/uL   Basophils Relative 1 %   Basophils Absolute 0.1 0.0 - 0.1 K/uL   Immature Granulocytes 0 %   Abs Immature Granulocytes 0.02 0.00 - 0.07 K/uL    Comment: Performed at [facility], [address], [place], [place] [address]  Comprehensive metabolic panel     Status: Abnormal   Collection Time: 07/17/18  8:43 PM  Result Value Ref Range   Sodium 140 135 - 145 mmol/L   Potassium 3.5 3.5 - 5.1 mmol/L   Chloride 105 98 - 111  mmol/L   CO2 25 22 - 32 mmol/L   Glucose, Bld 131 (H) 70 - 99 mg/dL   BUN 9 8 - 23 mg/dL   Creatinine, Ser 1.16 0.61 - 1.24 mg/dL   Calcium 9.7 8.9 - 10.3 mg/dL   Total Protein 8.2 (H) 6.5 - 8.1 g/dL   Albumin 4.7 3.5 - 5.0 g/dL   AST 25 15 - 41 U/L   ALT 22 0 - 44 U/L   Alkaline Phosphatase 55 38 - 126 U/L   Total Bilirubin 1.0 0.3 - 1.2 mg/dL   GFR calc non Af Amer >60 >60 mL/min   GFR calc Af Amer >60 >60 mL/min    Comment: (NOTE) The eGFR has been calculated using the CKD EPI equation. This calculation has not been validated in all clinical situations. eGFR's persistently <60 mL/min signify possible Chronic Kidney Disease.    Anion gap 10 5 - 15    Comment: Performed at [facility], [address], [place], [place] [address]  Lipase, blood     Status: Abnormal   Collection Time: 07/17/18  8:43 PM  Result Value Ref Range   Lipase 67 (H) 11 - 51 U/L    Comment: Performed at [facility], [address] [place], [place] [address]  Magnesium     Status: None   Collection Time: 07/17/18  8:43 PM  Result Value Ref Range   Magnesium 1.9 1.7 - 2.4 mg/dL    Comment: Performed at [facility], [address], [place], [place] [address]  Phosphorus     Status: None   Collection Time: 07/17/18  8:43  PM  Result Value Ref Range   Phosphorus 4.2 2.5 - 4.6 mg/dL    Comment: Performed at [facility], [address], [place], [place] [address]  Glucose, capillary     Status: Abnormal   Collection Time: 07/18/18 12:30 AM  Result Value Ref Range   Glucose-Capillary 122 (H) 70 - 99 mg/dL  CBC WITH DIFFERENTIAL     Status: Abnormal   Collection Time: 07/18/18  4:56 AM  Result Value Ref Range   WBC 6.2 4.0 - 10.5 K/uL   RBC 3.70 (L) 4.22 - 5.81 MIL/uL   Hemoglobin 11.1 (L) 13.0 - 17.0 g/dL   HCT 35.0 (L) 39.0 - 52.0 %   MCV 94.6 80.0 - 100.0 fL   MCH 30.0 26.0 - 34.0 pg   MCHC 31.7 30.0 - 36.0 g/dL   RDW 13.9 11.5 - 15.5 %   Platelets 225 150 - 400 K/uL   nRBC 0.0 0.0 - 0.2 %    Neutrophils Relative % 68 %   Neutro Abs 4.1 1.7 - 7.7 K/uL   Lymphocytes Relative 21 %   Lymphs Abs 1.3 0.7 - 4.0 K/uL   Monocytes Relative 10 %   Monocytes Absolute 0.6 0.1 - 1.0 K/uL   Eosinophils Relative 1 %   Eosinophils Absolute 0.1 0.0 - 0.5 K/uL   Basophils Relative 0 %   Basophils Absolute 0.0 0.0 - 0.1 K/uL   Immature Granulocytes 0 %   Abs Immature Granulocytes 0.02 0.00 - 0.07 K/uL    Comment: Performed at [facility], [address] [place], [place] [address]  Basic metabolic panel     Status: Abnormal   Collection Time: 07/18/18  4:56 AM  Result Value Ref Range   Sodium 141 135 - 145 mmol/L   Potassium 3.9 3.5 - 5.1 mmol/L   Chloride 111 98 - 111 mmol/L   CO2 25 22 - 32 mmol/L   Glucose, Bld 102 (H) 70 - 99 mg/dL   BUN 8 8 - 23 mg/dL   Creatinine, Ser 1.03 0.61 - 1.24 mg/dL   Calcium 8.6 (L) 8.9 - 10.3 mg/dL   GFR calc non Af Amer >60 >60 mL/min   GFR calc Af Amer >60 >60 mL/min    Comment: (NOTE) The eGFR has been calculated using the CKD EPI equation. This calculation has not been validated in all clinical situations. eGFR's persistently <60 mL/min signify possible Chronic Kidney Disease.    Anion gap 5 5 - 15    Comment: Performed at [facility], [address], [place], [place] [address]  Glucose, capillary     Status: Abnormal   Collection Time: 07/18/18  6:02 AM  Result Value Ref Range   Glucose-Capillary 108 (H) 70 - 99 mg/dL  Glucose, capillary     Status: None   Collection Time: 07/18/18 11:45 AM  Result Value Ref Range   Glucose-Capillary 83 70 - 99 mg/dL   Personally reviewed Xray and CT from 9/30 and 10/29- Also reviewed with radiology, Dr. 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 Poorly visible esophageal tube tip in the left upper quadrant. Lung fields are clear.  Heart size is stable. Aortic atherosclerosis. IMPRESSION: Difficult visualization of esophageal tube, tip appears to be in the left upper quadrant. Electronically Signed   By: Donavan Foil M.D.   On: 07/18/2018 00:08   Dg Abd Acute W/chest  Result Date: 07/17/2018 CLINICAL DATA:  Bowel obstruction EXAM: DG ABDOMEN ACUTE W/ 1V CHEST COMPARISON:  07/10/2018, CT 07/08/2018 FINDINGS: Single-view chest demonstrates elevated left diaphragm with atelectasis at the left base. No pleural effusion. Stable cardiomediastinal silhouette with aortic atherosclerosis. Upright view abdomen shows no free air beneath the diaphragm. Interval worsening of small bowel dilatation, measuring up to 7.1 cm in length with increased fluid levels on upright exam consistent with ongoing bowel obstruction. Questionable appearance of intramural air in the right lower quadrant. IMPRESSION: 1. Slight interval worsening of small bowel obstruction. No free air. 2. Possible appearance of intramural air in the right lower quadrant. CT is recommended for further evaluation. Electronically Signed   By: Donavan Foil M.D.   On: 07/17/2018 21:25     Assessment & Plan:  [name] Boyle is a 67 y.o. 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 He has opened up but continues to re-obstruct, so we will plan for surgery.  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 We have discussed this and he understands.   -CT a/p today to evaluate for any changes given  history -NG, NPO for now, if has BMs/ NG falls out can leave NG out for comfort  -Plan for Ex lap, lysis of adhesion possible resection on Monday   All questions were answered  to the satisfaction of the patient.  Updated Dr. Wynetta Emery.   Virl Cagey 07/18/2018, 1:11 PM

## 2018-07-18 NOTE — Progress Notes (Signed)
PROGRESS NOTE    [name] Boyle  ZHY:[id number]  DOB: 02/20/1951  DOA: 07/17/2018 PCP: Pearson Grippe, MD   Brief Admission Hx: [name] Boyle is a 67 y.o. male with medical history significant of type 2 diabetes, GERD, hyperlipidemia, hypertension, recent episode of SBO in late September and last month was admitted from 07/08/2018 to 07/12/2018 for SBO as well.  He returns today to the hospital after having several episodes of loose/soft stools yesterday, but no symptoms until this afternoon.  He says that he woke up this morning ate breakfast and lunch without any issues.  However, around 1600 he started having abdominal pain and abdominal distention.  This was followed by nausea and an episode of emesis at around 1900.   MDM/Assessment & Plan:   1. Recurrent small bowel obstruction-patient feeling much better after NGT placement.  His abdominal distention is much better.  He still has not had a bowel movement yet.  He still n.p.o. for now.  General surgery is gone evaluate him as well.  His pain is much better.  He is not having any nausea or vomiting. 2. GERD- continue famotidine IV. 3. Essential hypertension-being treated with IV enalapril while he is n.p.o.  His blood pressures have maintained stable. 4. Type 2 diabetes mellitus- his blood glucose is being monitored every 6 hours however his blood sugars have remained fairly stable and he is not requiring insulin at this time. 5. Hyperlipidemia- resume statin when he is able to take oral pills again.  DVT prophylaxis: SCDs Code Status: Full Family Communication: Patient fully updated at bedside Disposition Plan: Inpatient management   Consultants:  General surgery  Procedures:  NGT placement  Antimicrobials:  N/A  Subjective: The patient says he started to feel much better now.  He is not having any pain.  He denies having a bowel movement or passing flatus this morning.  Objective: Vitals:   07/17/18 1956 07/17/18 2150  07/17/18 2257 07/18/18 0546  BP:   138/72 (!) 141/82  Pulse:   69 (!) 54  Resp:      Temp:   98.3 F (36.8 C) 98.2 F (36.8 C)  TempSrc:   Oral Oral  SpO2:  98% 95% 95%  Weight: 74.8 kg     Height: 5\' 5"  (1.651 m)       Intake/Output Summary (Last 24 hours) at 07/18/2018 1205 Last data filed at 07/18/2018 0700 Gross per 24 hour  Intake 2604.04 ml  Output -  Net 2604.04 ml   Filed Weights   07/17/18 1956  Weight: 74.8 kg     REVIEW OF SYSTEMS  As per history otherwise all reviewed and reported negative  Exam:  General exam: Awake, alert, no distress, cooperative and pleasant. Respiratory system: Clear. No increased work of breathing. Cardiovascular system: S1 & S2 heard, RRR. No JVD, murmurs, gallops, clicks or pedal edema. Gastrointestinal system: Abdomen is nondistended, soft and no masses could be palpated. bowel sounds heard. Central nervous system: Alert and oriented. No focal neurological deficits. Extremities: no CCE.  Data Reviewed: Basic Metabolic Panel: Recent Labs  Lab 07/17/18 2043 07/18/18 0456  NA 140 141  K 3.5 3.9  CL 105 111  CO2 25 25  GLUCOSE 131* 102*  BUN 9 8  CREATININE 1.16 1.03  CALCIUM 9.7 8.6*  MG 1.9  --   PHOS 4.2  --    Liver Function Tests: Recent Labs  Lab 07/17/18 2043  AST 25  ALT 22  ALKPHOS 55  BILITOT 1.0  PROT 8.2*  ALBUMIN 4.7   Recent Labs  Lab 07/17/18 2043  LIPASE 67*   No results for input(s): AMMONIA in the last 168 hours. CBC: Recent Labs  Lab 07/17/18 2043 07/18/18 0456  WBC 11.4* 6.2  NEUTROABS 9.9* 4.1  HGB 13.5 11.1*  HCT 41.7 35.0*  MCV 92.3 94.6  PLT 276 225   Cardiac Enzymes: No results for input(s): CKTOTAL, CKMB, CKMBINDEX, TROPONINI in the last 168 hours. CBG (last 3)  Recent Labs    07/18/18 0030 07/18/18 0602 07/18/18 1145  GLUCAP 122* 108* 83   No results found for this or any previous visit (from the past 240 hour(s)).   Studies: Dg Chest Port 1 View  Result Date:  07/18/2018 CLINICAL DATA:  NG tube placement EXAM: PORTABLE CHEST 1 VIEW COMPARISON:  07/17/2018 FINDINGS: Elevation of the left diaphragm. Poorly visible esophageal tube tip in the left upper quadrant. Lung fields are clear. Heart size is stable. Aortic atherosclerosis. IMPRESSION: Difficult visualization of esophageal tube, tip appears to be in the left upper quadrant. Electronically Signed   By: Jasmine Pang M.D.   On: 07/18/2018 00:08   Dg Abd Acute W/chest  Result Date: 07/17/2018 CLINICAL DATA:  Bowel obstruction EXAM: DG ABDOMEN ACUTE W/ 1V CHEST COMPARISON:  07/10/2018, CT 07/08/2018 FINDINGS: Single-view chest demonstrates elevated left diaphragm with atelectasis at the left base. No pleural effusion. Stable cardiomediastinal silhouette with aortic atherosclerosis. Upright view abdomen shows no free air beneath the diaphragm. Interval worsening of small bowel dilatation, measuring up to 7.1 cm in length with increased fluid levels on upright exam consistent with ongoing bowel obstruction. Questionable appearance of intramural air in the right lower quadrant. IMPRESSION: 1. Slight interval worsening of small bowel obstruction. No free air. 2. Possible appearance of intramural air in the right lower quadrant. CT is recommended for further evaluation. Electronically Signed   By: Jasmine Pang M.D.   On: 07/17/2018 21:25     Scheduled Meds: . azelastine  1 spray Each Nare q morning - 10a  . enalaprilat  0.625 mg Intravenous Q6H  . fluticasone  2 spray Each Nare q morning - 10a   Continuous Infusions: . 0.9 % NaCl with KCl 40 mEq / L 100 mL/hr at 07/18/18 0400  . famotidine (PEPCID) IV 20 mg (07/18/18 1114)    Principal Problem:   SBO (small bowel obstruction) (HCC) Active Problems:   Gastroesophageal reflux disease without esophagitis   Hypertension   Type 2 diabetes mellitus (HCC)   Hyperlipidemia   Time spent:   Standley Dakins, MD, FAAFP [facility] Pager [phone]  [phone]  If 7PM-7AM, please contact night-coverage [email or web address] Password [email or web address] 07/18/2018, 12:05 PM    LOS: 1 day

## 2018-07-19 DIAGNOSIS — E785 Hyperlipidemia, unspecified: Secondary | ICD-10-CM

## 2018-07-19 LAB — CBC WITH DIFFERENTIAL/PLATELET
ABS IMMATURE GRANULOCYTES: 0.02 10*3/uL (ref 0.00–0.07)
Basophils Absolute: 0 10*3/uL (ref 0.0–0.1)
Basophils Relative: 0 %
EOS PCT: 2 %
Eosinophils Absolute: 0.1 10*3/uL (ref 0.0–0.5)
HCT: 35 % — ABNORMAL LOW (ref 39.0–52.0)
HEMOGLOBIN: 11.4 g/dL — AB (ref 13.0–17.0)
Immature Granulocytes: 0 %
LYMPHS ABS: 1 10*3/uL (ref 0.7–4.0)
LYMPHS PCT: 13 %
MCH: 30.4 pg (ref 26.0–34.0)
MCHC: 32.6 g/dL (ref 30.0–36.0)
MCV: 93.3 fL (ref 80.0–100.0)
MONO ABS: 0.7 10*3/uL (ref 0.1–1.0)
Monocytes Relative: 9 %
NEUTROS ABS: 6.2 10*3/uL (ref 1.7–7.7)
Neutrophils Relative %: 76 %
Platelets: 211 10*3/uL (ref 150–400)
RBC: 3.75 MIL/uL — ABNORMAL LOW (ref 4.22–5.81)
RDW: 13.7 % (ref 11.5–15.5)
WBC: 8.1 10*3/uL (ref 4.0–10.5)
nRBC: 0 % (ref 0.0–0.2)

## 2018-07-19 LAB — BASIC METABOLIC PANEL
Anion gap: 8 (ref 5–15)
BUN: 6 mg/dL — AB (ref 8–23)
CHLORIDE: 109 mmol/L (ref 98–111)
CO2: 22 mmol/L (ref 22–32)
CREATININE: 0.87 mg/dL (ref 0.61–1.24)
Calcium: 9.1 mg/dL (ref 8.9–10.3)
GFR calc Af Amer: >60 mL/min (ref >60)
GFR calc non Af Amer: >60 mL/min (ref >60)
Glucose, Bld: 90 mg/dL (ref 70–99)
POTASSIUM: 4.1 mmol/L (ref 3.5–5.1)
Sodium: 139 mmol/L (ref 135–145)

## 2018-07-19 LAB — GLUCOSE, CAPILLARY
GLUCOSE-CAPILLARY: 84 mg/dL (ref 70–99)
GLUCOSE-CAPILLARY: 86 mg/dL (ref 70–99)
Glucose-Capillary: 85 mg/dL (ref 70–99)
Glucose-Capillary: 91 mg/dL (ref 70–99)
Glucose-Capillary: 92 mg/dL (ref 70–99)

## 2018-07-19 MED ORDER — ENALAPRIL MALEATE 5 MG PO TABS
10.0000 mg | ORAL_TABLET | Freq: Two times a day (BID) | ORAL | Status: DC
  Administered 2018-07-19 – 2018-07-26 (×14): 10 mg via ORAL
  Filled 2018-07-19 (×14): qty 2

## 2018-07-19 MED ORDER — AMLODIPINE BESYLATE 5 MG PO TABS
10.0000 mg | ORAL_TABLET | Freq: Every morning | ORAL | Status: DC
  Administered 2018-07-19 – 2018-07-26 (×7): 10 mg via ORAL
  Filled 2018-07-19 (×7): qty 2

## 2018-07-19 MED ORDER — PANTOPRAZOLE SODIUM 40 MG PO TBEC
40.0000 mg | DELAYED_RELEASE_TABLET | Freq: Every morning | ORAL | Status: DC
  Administered 2018-07-19 – 2018-07-26 (×7): 40 mg via ORAL
  Filled 2018-07-19 (×8): qty 1

## 2018-07-19 NOTE — Progress Notes (Signed)
Patient has ambulated multiple times throughout the shift. Patient denies any current discomfort or distress.

## 2018-07-19 NOTE — Progress Notes (Signed)
Attempted to obtain surgical consent from patient however patient indicated that the risks and benefits have not been explained to him and I explained that surgeon will speak with him before any surgical procedure will take place. Patient requires reeducation on surgical procedure.

## 2018-07-19 NOTE — Progress Notes (Signed)
[facility] Progress Note     Subjective: Feeling better. Passing flatus. No bm. CT reviewed and shows resolving SBO. Have discussed with patient, given his past month and multiple admissions, will still plan for exploration.   Objective: Vital signs in last 24 hours: Temp:  [98.6 F (37 C)-98.7 F (37.1 C)] 98.6 F (37 C) (11/09 0630) Pulse Rate:  [48-56] 54 (11/09 0630) Resp:  [17-20] 17 (11/09 0630) BP: (156-169)/(74-84) 156/84 (11/09 0630) SpO2:  [98 %-100 %] 98 % (11/09 0630) Last BM Date: 07/12/18  Intake/Output from previous day: 11/08 0701 - 11/09 0700 In: 942.8 [I.V.:942.8] Out: 2900 [Urine:600; Emesis/NG output:2300] Intake/Output this shift: Total I/O In: -  Out: 500 [Urine:500]  General appearance: alert, cooperative and no distress Resp: normal work breathing GI: soft, non-tender; bowel sounds normal; no masses,  no organomegaly  Lab Results:  Recent Labs    07/18/18 0456 07/19/18 0533  WBC 6.2 8.1  HGB 11.1* 11.4*  HCT 35.0* 35.0*  PLT 225 211   BMET Recent Labs    07/18/18 0456 07/19/18 0533  NA 141 139  K 3.9 4.1  CL 111 109  CO2 25 22  GLUCOSE 102* 90  BUN 8 6*  CREATININE 1.03 0.87  CALCIUM 8.6* 9.1   PT/INR No results for input(s): LABPROT, INR in the last 72 hours.  Studies/Results: Ct Abdomen Pelvis W Contrast  Result Date: 07/18/2018 CLINICAL DATA:  Bowel obstruction.  Abdominal pain. EXAM: CT ABDOMEN AND PELVIS WITH CONTRAST TECHNIQUE: Multidetector CT imaging of the abdomen and pelvis was performed using the standard protocol following bolus administration of intravenous contrast. CONTRAST:  ISOVUE-300 IOPAMIDOL (ISOVUE-300) INJECTION 61% COMPARISON:  Radiographs dated 07/17/2018 and CT scans dated 07/08/2018 and 06/09/2018 FINDINGS: Lower chest: There is minimal atelectasis at the lung bases. Heart size is normal. Hepatobiliary: 12 mm cyst in the left lobe of the liver adjacent to the falciform ligament.  Liver parenchyma is otherwise normal. Biliary tree is normal. Pancreas: Unremarkable. No pancreatic ductal dilatation or surrounding inflammatory changes. Spleen: Normal in size without focal abnormality. Adrenals/Urinary Tract: Stable bilateral renal cysts. No hydronephrosis. Adrenal glands and bladder are normal. Stomach/Bowel: There is decreased distention of mid small bowel loops. NG tube in the upper body of the stomach. Distal small bowel and colon appear normal. Appendix is normal. Vascular/Lymphatic: Aortic atherosclerosis. No enlarged abdominal or pelvic lymph nodes. Reproductive: Prostate is unremarkable. Other: No free air or free fluid.  No abdominal wall hernia. Musculoskeletal: No acute abnormality. Facet arthritis in the lower lumbar spine. IMPRESSION: 1. Decreased distention of small bowel loops. Oral contrast has passed through the distended loops into nondistended distal small bowel. Findings are consistent with resolving partial small bowel obstruction. 2.  Aortic Atherosclerosis (ICD10-I70.0). 3. Minimal atelectasis at the lung bases. Chronic elevation of the left hemidiaphragm. Electronically Signed   By: Francene Boyers M.D.   On: 07/18/2018 14:05   Dg Chest Port 1 View  Result Date: 07/18/2018 CLINICAL DATA:  NG tube placement EXAM: PORTABLE CHEST 1 VIEW COMPARISON:  07/17/2018 FINDINGS: Elevation of the left diaphragm. Poorly visible esophageal tube tip in the left upper quadrant. Lung fields are clear. Heart size is stable. Aortic atherosclerosis. IMPRESSION: Difficult visualization of esophageal tube, tip appears to be in the left upper quadrant. Electronically Signed   By: Jasmine Pang M.D.   On: 07/18/2018 00:08   Dg Abd Acute W/chest  Result Date: 07/17/2018 CLINICAL DATA:  Bowel obstruction EXAM: DG ABDOMEN ACUTE W/ 1V  CHEST COMPARISON:  07/10/2018, CT 07/08/2018 FINDINGS: Single-view chest demonstrates elevated left diaphragm with atelectasis at the left base. No pleural  effusion. Stable cardiomediastinal silhouette with aortic atherosclerosis. Upright view abdomen shows no free air beneath the diaphragm. Interval worsening of small bowel dilatation, measuring up to 7.1 cm in length with increased fluid levels on upright exam consistent with ongoing bowel obstruction. Questionable appearance of intramural air in the right lower quadrant. IMPRESSION: 1. Slight interval worsening of small bowel obstruction. No free air. 2. Possible appearance of intramural air in the right lower quadrant. CT is recommended for further evaluation. Electronically Signed   By: Jasmine Pang M.D.   On: 07/17/2018 21:25    Anti-infectives: Anti-infectives (From admission, onward)   None      Assessment/Plan:  [name] Boyle is a 67 yo resolving SBO that has been admitted multiple times in the past month for recurrence. Will plan to explore on Monday. NG out today Clear diet ok, cream with coffee  Patient monitoring for bloating, nausea, etc    LOS: 2 days    [name] Boyle 07/19/2018

## 2018-07-19 NOTE — Progress Notes (Signed)
PROGRESS NOTE  [name] Boyle  ZOX:[id number]  DOB: 1950/10/09  DOA: 07/17/2018 PCP: [name] Grippe, MD   Brief Admission Hx: [name] Boyle is a 67 y.o. male with medical history significant of type 2 diabetes, GERD, hyperlipidemia, hypertension, recent episode of SBO in late September and last month was admitted from 07/08/2018 to 07/12/2018 for SBO as well.  He returns today to the hospital after having several episodes of loose/soft stools yesterday, but no symptoms until this afternoon.  He says that he woke up this morning ate breakfast and lunch without any issues.  However, around 1600 he started having abdominal pain and abdominal distention.  This was followed by nausea and an episode of emesis at around 1900.   MDM/Assessment & Plan:   1. Recurrent small bowel obstruction-Pt having flatus.   His abdominal distention is much better.  Surgery saw him and NG coming out later today and clears started.  Surgery planning on ex lap on Monday.   2. GERD- resume home protonix.  3. Essential hypertension-resume home oral BP meds now that clears started.  4. Type 2 diabetes mellitus- monitor cbg ac and add SSI if needed. 5. Hyperlipidemia- temporarily holding atorvastatin.   DVT prophylaxis: SCDs Code Status: Full Family Communication: Patient fully updated at bedside Disposition Plan: Inpatient management   Consultants:  General surgery  Procedures:  NGT placement/removal  Antimicrobials:  N/A  Subjective: The patient having flatus but no BM.    Objective: Vitals:   07/18/18 0546 07/18/18 1401 07/18/18 2253 07/19/18 0630  BP: (!) 141/82 (!) 157/74 (!) 169/82 (!) 156/84  Pulse: (!) 54 (!) 48 (!) 56 (!) 54  Resp:  19 20 17   Temp: 98.2 F (36.8 C)  98.7 F (37.1 C) 98.6 F (37 C)  TempSrc: Oral  Oral Oral  SpO2: 95% 100% 100% 98%  Weight:      Height:        Intake/Output Summary (Last 24 hours) at 07/19/2018 1257 Last data filed at 07/19/2018 8119 Gross per 24 hour    Intake 942.78 ml  Output 3400 ml  Net -2457.22 ml   Filed Weights   07/17/18 1956  Weight: 74.8 kg     REVIEW OF SYSTEMS  As per history otherwise all reviewed and reported negative  Exam:  General exam: Awake, alert, no distress, cooperative and pleasant. Respiratory system: Clear. No increased work of breathing. Cardiovascular system: S1 & S2 heard, RRR. No JVD, murmurs, gallops, clicks or pedal edema. Gastrointestinal system: Abdomen is nondistended, soft and no masses could be palpated. bowel sounds heard. Central nervous system: Alert and oriented. No focal neurological deficits. Extremities: no CCE.  Data Reviewed: Basic Metabolic Panel: Recent Labs  Lab 07/17/18 2043 07/18/18 0456 07/19/18 0533  NA 140 141 139  K 3.5 3.9 4.1  CL 105 111 109  CO2 25 25 22   GLUCOSE 131* 102* 90  BUN 9 8 6*  CREATININE 1.16 1.03 0.87  CALCIUM 9.7 8.6* 9.1  MG 1.9  --   --   PHOS 4.2  --   --    Liver Function Tests: Recent Labs  Lab 07/17/18 2043  AST 25  ALT 22  ALKPHOS 55  BILITOT 1.0  PROT 8.2*  ALBUMIN 4.7   Recent Labs  Lab 07/17/18 2043  LIPASE 67*   No results for input(s): AMMONIA in the last 168 hours. CBC: Recent Labs  Lab 07/17/18 2043 07/18/18 0456 07/19/18 0533  WBC 11.4* 6.2 8.1  NEUTROABS 9.9* 4.1  6.2  HGB 13.5 11.1* 11.4*  HCT 41.7 35.0* 35.0*  MCV 92.3 94.6 93.3  PLT 276 225 211   Cardiac Enzymes: No results for input(s): CKTOTAL, CKMB, CKMBINDEX, TROPONINI in the last 168 hours. CBG (last 3)  Recent Labs    07/19/18 0010 07/19/18 0647 07/19/18 1139  GLUCAP 91 85 84   No results found for this or any previous visit (from the past 240 hour(s)).   Studies: Ct Abdomen Pelvis W Contrast  Result Date: 07/18/2018 CLINICAL DATA:  Bowel obstruction.  Abdominal pain. EXAM: CT ABDOMEN AND PELVIS WITH CONTRAST TECHNIQUE: Multidetector CT imaging of the abdomen and pelvis was performed using the standard protocol following bolus  administration of intravenous contrast. CONTRAST:  ISOVUE-300 IOPAMIDOL (ISOVUE-300) INJECTION 61% COMPARISON:  Radiographs dated 07/17/2018 and CT scans dated 07/08/2018 and 06/09/2018 FINDINGS: Lower chest: There is minimal atelectasis at the lung bases. Heart size is normal. Hepatobiliary: 12 mm cyst in the left lobe of the liver adjacent to the falciform ligament. Liver parenchyma is otherwise normal. Biliary tree is normal. Pancreas: Unremarkable. No pancreatic ductal dilatation or surrounding inflammatory changes. Spleen: Normal in size without focal abnormality. Adrenals/Urinary Tract: Stable bilateral renal cysts. No hydronephrosis. Adrenal glands and bladder are normal. Stomach/Bowel: There is decreased distention of mid small bowel loops. NG tube in the upper body of the stomach. Distal small bowel and colon appear normal. Appendix is normal. Vascular/Lymphatic: Aortic atherosclerosis. No enlarged abdominal or pelvic lymph nodes. Reproductive: Prostate is unremarkable. Other: No free air or free fluid.  No abdominal wall hernia. Musculoskeletal: No acute abnormality. Facet arthritis in the lower lumbar spine. IMPRESSION: 1. Decreased distention of small bowel loops. Oral contrast has passed through the distended loops into nondistended distal small bowel. Findings are consistent with resolving partial small bowel obstruction. 2.  Aortic Atherosclerosis (ICD10-I70.0). 3. Minimal atelectasis at the lung bases. Chronic elevation of the left hemidiaphragm. Electronically Signed   By: Francene Boyers M.D.   On: 07/18/2018 14:05   Dg Chest Port 1 View  Result Date: 07/18/2018 CLINICAL DATA:  NG tube placement EXAM: PORTABLE CHEST 1 VIEW COMPARISON:  07/17/2018 FINDINGS: Elevation of the left diaphragm. Poorly visible esophageal tube tip in the left upper quadrant. Lung fields are clear. Heart size is stable. Aortic atherosclerosis. IMPRESSION: Difficult visualization of esophageal tube, tip appears to  be in the left upper quadrant. Electronically Signed   By: Jasmine Pang M.D.   On: 07/18/2018 00:08   Dg Abd Acute W/chest  Result Date: 07/17/2018 CLINICAL DATA:  Bowel obstruction EXAM: DG ABDOMEN ACUTE W/ 1V CHEST COMPARISON:  07/10/2018, CT 07/08/2018 FINDINGS: Single-view chest demonstrates elevated left diaphragm with atelectasis at the left base. No pleural effusion. Stable cardiomediastinal silhouette with aortic atherosclerosis. Upright view abdomen shows no free air beneath the diaphragm. Interval worsening of small bowel dilatation, measuring up to 7.1 cm in length with increased fluid levels on upright exam consistent with ongoing bowel obstruction. Questionable appearance of intramural air in the right lower quadrant. IMPRESSION: 1. Slight interval worsening of small bowel obstruction. No free air. 2. Possible appearance of intramural air in the right lower quadrant. CT is recommended for further evaluation. Electronically Signed   By: Jasmine Pang M.D.   On: 07/17/2018 21:25   Scheduled Meds: . azelastine  1 spray Each Nare q morning - 10a  . enalaprilat  0.625 mg Intravenous Q6H  . fluticasone  2 spray Each Nare q morning - 10a   Continuous Infusions: .  0.9 % NaCl with KCl 40 mEq / L 100 mL/hr (07/19/18 1124)  . famotidine (PEPCID) IV 20 mg (07/19/18 1126)    Principal Problem:   SBO (small bowel obstruction) (HCC) Active Problems:   Gastroesophageal reflux disease without esophagitis   Hypertension   Type 2 diabetes mellitus (HCC)   Hyperlipidemia   Intestinal adhesions with partial obstruction (HCC)  Time spent:   Standley Dakins, MD, FAAFP [facility] Pager [phone]  If 7PM-7AM, please contact night-coverage [email or web address] Password [email or web address] 07/19/2018, 12:57 PM    LOS: 2 days

## 2018-07-20 LAB — CBC WITH DIFFERENTIAL/PLATELET
ABS IMMATURE GRANULOCYTES: 0.01 10*3/uL (ref 0.00–0.07)
BASOS PCT: 1 %
Basophils Absolute: 0.1 10*3/uL (ref 0.0–0.1)
Eosinophils Absolute: 0.2 10*3/uL (ref 0.0–0.5)
Eosinophils Relative: 3 %
HEMATOCRIT: 35.9 % — AB (ref 39.0–52.0)
HEMOGLOBIN: 11.9 g/dL — AB (ref 13.0–17.0)
Immature Granulocytes: 0 %
LYMPHS PCT: 23 %
Lymphs Abs: 1.2 10*3/uL (ref 0.7–4.0)
MCH: 31.1 pg (ref 26.0–34.0)
MCHC: 33.1 g/dL (ref 30.0–36.0)
MCV: 93.7 fL (ref 80.0–100.0)
MONO ABS: 0.6 10*3/uL (ref 0.1–1.0)
Monocytes Relative: 11 %
NEUTROS ABS: 3.4 10*3/uL (ref 1.7–7.7)
Neutrophils Relative %: 62 %
Platelets: 236 10*3/uL (ref 150–400)
RBC: 3.83 MIL/uL — ABNORMAL LOW (ref 4.22–5.81)
RDW: 13.5 % (ref 11.5–15.5)
WBC: 5.4 10*3/uL (ref 4.0–10.5)
nRBC: 0 % (ref 0.0–0.2)

## 2018-07-20 LAB — GLUCOSE, CAPILLARY
GLUCOSE-CAPILLARY: 153 mg/dL — AB (ref 70–99)
GLUCOSE-CAPILLARY: 104 mg/dL — AB (ref 70–99)
Glucose-Capillary: 90 mg/dL (ref 70–99)
Glucose-Capillary: 51 mg/dL — ABNORMAL LOW (ref 70–99)
Glucose-Capillary: 89 mg/dL (ref 70–99)

## 2018-07-20 LAB — TYPE AND SCREEN
ABO/RH(D): B POS
ANTIBODY SCREEN: NEGATIVE

## 2018-07-20 LAB — SURGICAL PCR SCREEN
MRSA, PCR: NEGATIVE
STAPHYLOCOCCUS AUREUS: POSITIVE — AB

## 2018-07-20 MED ORDER — CHLORHEXIDINE GLUCONATE CLOTH 2 % EX PADS
6.0000 | MEDICATED_PAD | Freq: Every day | CUTANEOUS | Status: DC
  Administered 2018-07-21: 6 via TOPICAL

## 2018-07-20 MED ORDER — MUPIROCIN 2 % EX OINT
1.0000 "application " | TOPICAL_OINTMENT | Freq: Two times a day (BID) | CUTANEOUS | Status: AC
  Administered 2018-07-20 – 2018-07-25 (×10): 1 via NASAL
  Filled 2018-07-20: qty 22

## 2018-07-20 MED ORDER — SODIUM CHLORIDE 0.9 % IV SOLN
2.0000 g | INTRAVENOUS | Status: AC
  Administered 2018-07-21: 2 g via INTRAVENOUS
  Filled 2018-07-20 (×2): qty 2

## 2018-07-20 MED ORDER — CHLORHEXIDINE GLUCONATE CLOTH 2 % EX PADS
6.0000 | MEDICATED_PAD | Freq: Once | CUTANEOUS | Status: AC
  Administered 2018-07-20: 6 via TOPICAL

## 2018-07-20 NOTE — Progress Notes (Signed)
[facility] Progress Note     Subjective: No major issues. Ambulating. Passing flatus but no BM still. No complaints.   Objective: Vital signs in last 24 hours: Temp:  [98.1 F (36.7 C)-100 F (37.8 C)] 98.1 F (36.7 C) (11/10 0528) Pulse Rate:  [47-65] 47 (11/10 0528) Resp:  [16-19] 16 (11/10 0528) BP: (140-149)/(73-88) 140/73 (11/10 0528) SpO2:  [100 %] 100 % (11/10 0528) Last BM Date: 07/12/18  Intake/Output from previous day: 11/09 0701 - 11/10 0700 In: 1150 [P.O.:1150] Out: 1750 [Urine:1750] Intake/Output this shift: Total I/O In: 550 [P.O.:550] Out: 400 [Urine:400]  General appearance: alert, cooperative and no distress Resp: normal work breathing GI: soft, nontender, nondistended  Lab Results:  Recent Labs    07/19/18 0533 07/20/18 0630  WBC 8.1 5.4  HGB 11.4* 11.9*  HCT 35.0* 35.9*  PLT 211 236   BMET Recent Labs    07/18/18 0456 07/19/18 0533  NA 141 139  K 3.9 4.1  CL 111 109  CO2 25 22  GLUCOSE 102* 90  BUN 8 6*  CREATININE 1.03 0.87  CALCIUM 8.6* 9.1   PT/INR No results for input(s): LABPROT, INR in the last 72 hours.  Studies/Results: Ct Abdomen Pelvis W Contrast  Result Date: 07/18/2018 CLINICAL DATA:  Bowel obstruction.  Abdominal pain. EXAM: CT ABDOMEN AND PELVIS WITH CONTRAST TECHNIQUE: Multidetector CT imaging of the abdomen and pelvis was performed using the standard protocol following bolus administration of intravenous contrast. CONTRAST:  ISOVUE-300 IOPAMIDOL (ISOVUE-300) INJECTION 61% COMPARISON:  Radiographs dated 07/17/2018 and CT scans dated 07/08/2018 and 06/09/2018 FINDINGS: Lower chest: There is minimal atelectasis at the lung bases. Heart size is normal. Hepatobiliary: 12 mm cyst in the left lobe of the liver adjacent to the falciform ligament. Liver parenchyma is otherwise normal. Biliary tree is normal. Pancreas: Unremarkable. No pancreatic ductal dilatation or surrounding inflammatory changes.  Spleen: Normal in size without focal abnormality. Adrenals/Urinary Tract: Stable bilateral renal cysts. No hydronephrosis. Adrenal glands and bladder are normal. Stomach/Bowel: There is decreased distention of mid small bowel loops. NG tube in the upper body of the stomach. Distal small bowel and colon appear normal. Appendix is normal. Vascular/Lymphatic: Aortic atherosclerosis. No enlarged abdominal or pelvic lymph nodes. Reproductive: Prostate is unremarkable. Other: No free air or free fluid.  No abdominal wall hernia. Musculoskeletal: No acute abnormality. Facet arthritis in the lower lumbar spine. IMPRESSION: 1. Decreased distention of small bowel loops. Oral contrast has passed through the distended loops into nondistended distal small bowel. Findings are consistent with resolving partial small bowel obstruction. 2.  Aortic Atherosclerosis (ICD10-I70.0). 3. Minimal atelectasis at the lung bases. Chronic elevation of the left hemidiaphragm. Electronically Signed   By: [name] Boyle M.D.   On: 07/18/2018 14:05     Assessment/Plan: [name] Boyle is a 67 yo with recurrent SBO that has been occurring for past month, requiring multiple admissions.  OR tomorrow for exploration Consent signed Blood consent will be obtained  Type and screen sent Cefotetan OCTOR  Discussed risk and benefits of exploration including but not limited to bleeding, infection, need to resect bowel, risk of injury to bowel, risk of forming additional scar tissue, risk of recurrence of SBO. [name] Boyle has opted to proceed.    LOS: 3 days    [name] Boyle 07/20/2018

## 2018-07-20 NOTE — Progress Notes (Signed)
PROGRESS NOTE  Rubert Frediani ZOX:[id number] DOB: Mar 07, 1951 DOA: 07/17/2018 PCP: Pearson Grippe, MD  Brief Narrative: [name] Boyle is a 67 year old male with past medical history of type 2 diabetes mellitus GERD hyperlipidemia hypertension recurrent small bowel obstruction.  He said he has not made a bowel movement but he is passing a lot of flatus.  He has been scheduled for exploratory surgery tomorrow   Interval history/Subjective: Patient's blood sugar dropped he is n.p.o. except  liquids full  Assessment/Plan #1 recurrent small bowel obstruction patient is passing flatus he is moving around without any problem.  He is scheduled for exploratory laparotomy on Monday 2.  GERD continue home Protonix 3.  Hypertension controlled continue current medications 4.  Type 2 diabetes mellitus we will monitor blood sugars and use sliding scale for coverage 5.  Hyperlipidemia his atorvastatin is on hold    DVT prophylaxis: SCD Code Status: Full Family Communication: None at bedside Disposition Plan: Home  Dr. Barrie Folk [facility] Pager [phone]  07/20/2018, 7:30 PM  LOS: 3 days   Consultants:  General surgery  Procedures:  NG tube placement and removal  Antimicrobials:  None   Objective: Vitals:  Vitals:   07/20/18 0528 07/20/18 1427  BP: 140/73 113/64  Pulse: (!) 47 (!) 51  Resp: 16 19  Temp: 98.1 F (36.7 C) 98.9 F (37.2 C)  SpO2: 100% 100%    Exam:  Constitutional:  . Appears calm and comfortable Eyes:  . pupils and irises appear normal . Normal lids and conjunctivae ENMT:  . grossly normal hearing  . Lips appear normal . external ears, nose appear normal . Oropharynx: mucosa, tongue,posterior pharynx appear normal Neck:  . neck appears normal, no masses, normal ROM, supple . no thyromegaly Respiratory:  . CTA bilaterally, no w/r/r.  . Respiratory effort normal. No retractions or accessory muscle use Cardiovascular:  . RRR, no m/r/g . No LE extremity  edema   . Normal pedal pulses Abdomen:  . Abdomen appears normal; no tenderness or masses . No hernias . No HSM Musculoskeletal:  . Digits/nails BUE: no clubbing, cyanosis, petechiae, infection . exam of joints, bones, muscles of at least one of following: head/neck, RUE, LUE, RLE, LLE   o strength and tone normal, no atrophy, no abnormal movements o No tenderness, masses o Normal ROM, no contractures  . gait and station Skin:  . No rashes, lesions, ulcers . palpation of skin: no induration or nodules Neurologic:  . CN 2-12 intact . Sensation all 4 extremities intact Psychiatric:  . Mental status o Mood, affect appropriate o Orientation to person, place, time  . judgment and insight appear intact     I have personally reviewed the following:   Data: .   Scheduled Meds: . amLODipine  10 mg Oral q morning - 10a  . azelastine  1 spray Each Nare q morning - 10a  . Chlorhexidine Gluconate Cloth  6 each Topical Once  . Chlorhexidine Gluconate Cloth  6 each Topical Daily  . enalapril  10 mg Oral BID  . fluticasone  2 spray Each Nare q morning - 10a  . mupirocin ointment  1 application Nasal BID  . pantoprazole  40 mg Oral q morning - 10a   Continuous Infusions: . 0.9 % NaCl with KCl 40 mEq / L 50 mL/hr (07/20/18 0811)  . [START ON 07/21/2018] cefoTEtan (CEFOTAN) IV    . famotidine (PEPCID) IV 20 mg (07/20/18 8295)    Principal Problem:   SBO (small bowel  obstruction) (HCC) Active Problems:   Gastroesophageal reflux disease without esophagitis   Hypertension   Type 2 diabetes mellitus (HCC)   Hyperlipidemia   Intestinal adhesions with partial obstruction (HCC)   LOS: 3 days

## 2018-07-21 ENCOUNTER — Inpatient Hospital Stay (HOSPITAL_COMMUNITY): Payer: Medicare Other | Admitting: Anesthesiology

## 2018-07-21 ENCOUNTER — Encounter (HOSPITAL_COMMUNITY): Payer: Self-pay

## 2018-07-21 ENCOUNTER — Encounter (HOSPITAL_COMMUNITY)
Admission: EM | Disposition: A | Discharge status: Home / Self Care | Payer: Self-pay | Source: Home / Self Care | Attending: Family Medicine

## 2018-07-21 HISTORY — PX: LYSIS OF ADHESION: SHX5961

## 2018-07-21 HISTORY — PX: LAPAROTOMY: SHX154

## 2018-07-21 LAB — BASIC METABOLIC PANEL
Anion gap: 6 (ref 5–15)
BUN: 6 mg/dL — AB (ref 8–23)
CHLORIDE: 109 mmol/L (ref 98–111)
CO2: 26 mmol/L (ref 22–32)
Calcium: 9.2 mg/dL (ref 8.9–10.3)
Creatinine, Ser: 1.06 mg/dL (ref 0.61–1.24)
GFR calc non Af Amer: >60 mL/min (ref >60)
Glucose, Bld: 88 mg/dL (ref 70–99)
POTASSIUM: 4 mmol/L (ref 3.5–5.1)
SODIUM: 141 mmol/L (ref 135–145)

## 2018-07-21 LAB — CBC WITH DIFFERENTIAL/PLATELET
ABS IMMATURE GRANULOCYTES: 0 10*3/uL (ref 0.00–0.07)
BASOS PCT: 1 %
Basophils Absolute: 0 10*3/uL (ref 0.0–0.1)
Eosinophils Absolute: 0.2 10*3/uL (ref 0.0–0.5)
Eosinophils Relative: 3 %
HCT: 34.8 % — ABNORMAL LOW (ref 39.0–52.0)
HEMOGLOBIN: 11.4 g/dL — AB (ref 13.0–17.0)
IMMATURE GRANULOCYTES: 0 %
LYMPHS ABS: 1.6 10*3/uL (ref 0.7–4.0)
LYMPHS PCT: 30 %
MCH: 30.6 pg (ref 26.0–34.0)
MCHC: 32.8 g/dL (ref 30.0–36.0)
MCV: 93.3 fL (ref 80.0–100.0)
MONO ABS: 0.6 10*3/uL (ref 0.1–1.0)
MONOS PCT: 11 %
NEUTROS ABS: 3 10*3/uL (ref 1.7–7.7)
NEUTROS PCT: 55 %
PLATELETS: 246 10*3/uL (ref 150–400)
RBC: 3.73 MIL/uL — ABNORMAL LOW (ref 4.22–5.81)
RDW: 13.6 % (ref 11.5–15.5)
WBC: 5.4 10*3/uL (ref 4.0–10.5)
nRBC: 0 % (ref 0.0–0.2)

## 2018-07-21 LAB — GLUCOSE, CAPILLARY
GLUCOSE-CAPILLARY: 90 mg/dL (ref 70–99)
GLUCOSE-CAPILLARY: 86 mg/dL (ref 70–99)
Glucose-Capillary: 112 mg/dL — ABNORMAL HIGH (ref 70–99)
Glucose-Capillary: 106 mg/dL — ABNORMAL HIGH (ref 70–99)

## 2018-07-21 SURGERY — LAPAROTOMY, EXPLORATORY
Anesthesia: General | Site: Abdomen

## 2018-07-21 MED ORDER — HYDROCODONE-ACETAMINOPHEN 7.5-325 MG PO TABS: 1.0000 | ORAL_TABLET | Freq: Once | ORAL | Status: DC | PRN

## 2018-07-21 MED ORDER — LACTATED RINGERS IV SOLN: INTRAVENOUS | Status: DC

## 2018-07-21 MED ORDER — SUCCINYLCHOLINE CHLORIDE 200 MG/10ML IV SOSY
PREFILLED_SYRINGE | INTRAVENOUS | Status: AC
  Filled 2018-07-21: qty 10

## 2018-07-21 MED ORDER — BUPIVACAINE LIPOSOME 1.3 % IJ SUSP
INTRAMUSCULAR | Status: AC
  Filled 2018-07-21: qty 20

## 2018-07-21 MED ORDER — LACTATED RINGERS IV SOLN
INTRAVENOUS | Status: DC | PRN
  Administered 2018-07-21 (×2): via INTRAVENOUS

## 2018-07-21 MED ORDER — GLYCOPYRROLATE PF 0.2 MG/ML IJ SOSY
PREFILLED_SYRINGE | INTRAMUSCULAR | Status: AC
  Filled 2018-07-21: qty 1

## 2018-07-21 MED ORDER — MORPHINE SULFATE (PF) 2 MG/ML IV SOLN: 2.0000 mg | INTRAVENOUS | Status: DC | PRN

## 2018-07-21 MED ORDER — SUGAMMADEX SODIUM 500 MG/5ML IV SOLN
INTRAVENOUS | Status: DC | PRN
  Administered 2018-07-21: 200 mg via INTRAVENOUS

## 2018-07-21 MED ORDER — SUGAMMADEX SODIUM 200 MG/2ML IV SOLN
INTRAVENOUS | Status: AC
  Filled 2018-07-21: qty 2

## 2018-07-21 MED ORDER — HYDROMORPHONE HCL 1 MG/ML IJ SOLN
0.2500 mg | INTRAMUSCULAR | Status: DC | PRN
  Administered 2018-07-21 (×2): 0.5 mg via INTRAVENOUS
  Filled 2018-07-21 (×2): qty 0.5

## 2018-07-21 MED ORDER — MIDAZOLAM HCL 5 MG/5ML IJ SOLN
INTRAMUSCULAR | Status: DC | PRN
  Administered 2018-07-21: 2 mg via INTRAVENOUS

## 2018-07-21 MED ORDER — KETOROLAC TROMETHAMINE 30 MG/ML IJ SOLN
30.0000 mg | Freq: Four times a day (QID) | INTRAMUSCULAR | Status: DC
  Administered 2018-07-21 – 2018-07-22 (×3): 30 mg via INTRAVENOUS
  Filled 2018-07-21 (×3): qty 1

## 2018-07-21 MED ORDER — LIDOCAINE 2% (20 MG/ML) 5 ML SYRINGE
INTRAMUSCULAR | Status: AC
  Filled 2018-07-21: qty 5

## 2018-07-21 MED ORDER — ONDANSETRON HCL 4 MG/2ML IJ SOLN
INTRAMUSCULAR | Status: DC | PRN
  Administered 2018-07-21: 4 mg via INTRAVENOUS

## 2018-07-21 MED ORDER — BUPIVACAINE LIPOSOME 1.3 % IJ SUSP
INTRAMUSCULAR | Status: DC | PRN
  Administered 2018-07-21: 20 mL

## 2018-07-21 MED ORDER — PROMETHAZINE HCL 25 MG/ML IJ SOLN: 6.2500 mg | INTRAMUSCULAR | Status: DC | PRN

## 2018-07-21 MED ORDER — PROPOFOL 10 MG/ML IV BOLUS
INTRAVENOUS | Status: DC | PRN
  Administered 2018-07-21: 150 mg via INTRAVENOUS

## 2018-07-21 MED ORDER — PROPOFOL 10 MG/ML IV BOLUS
INTRAVENOUS | Status: AC
  Filled 2018-07-21: qty 20

## 2018-07-21 MED ORDER — GLYCOPYRROLATE 0.2 MG/ML IJ SOLN
INTRAMUSCULAR | Status: DC | PRN
  Administered 2018-07-21: 0.2 mg via INTRAVENOUS

## 2018-07-21 MED ORDER — LIDOCAINE HCL 1 % IJ SOLN
INTRAMUSCULAR | Status: DC | PRN
  Administered 2018-07-21: 40 mg via INTRADERMAL

## 2018-07-21 MED ORDER — MEPERIDINE HCL 50 MG/ML IJ SOLN: 6.2500 mg | INTRAMUSCULAR | Status: DC | PRN

## 2018-07-21 MED ORDER — FENTANYL CITRATE (PF) 250 MCG/5ML IJ SOLN
INTRAMUSCULAR | Status: AC
  Filled 2018-07-21: qty 5

## 2018-07-21 MED ORDER — MIDAZOLAM HCL 2 MG/2ML IJ SOLN
INTRAMUSCULAR | Status: AC
  Filled 2018-07-21: qty 2

## 2018-07-21 MED ORDER — SUCCINYLCHOLINE CHLORIDE 20 MG/ML IJ SOLN
INTRAMUSCULAR | Status: DC | PRN
  Administered 2018-07-21: 140 mg via INTRAVENOUS

## 2018-07-21 MED ORDER — 0.9 % SODIUM CHLORIDE (POUR BTL) OPTIME
TOPICAL | Status: DC | PRN
  Administered 2018-07-21: 1000 mL

## 2018-07-21 MED ORDER — FENTANYL CITRATE (PF) 100 MCG/2ML IJ SOLN
INTRAMUSCULAR | Status: DC | PRN
  Administered 2018-07-21 (×3): 50 ug via INTRAVENOUS
  Administered 2018-07-21: 100 ug via INTRAVENOUS

## 2018-07-21 MED ORDER — ROCURONIUM BROMIDE 100 MG/10ML IV SOLN
INTRAVENOUS | Status: DC | PRN
  Administered 2018-07-21: 35 mg via INTRAVENOUS
  Administered 2018-07-21: 5 mg via INTRAVENOUS

## 2018-07-21 MED ORDER — ROCURONIUM BROMIDE 10 MG/ML (PF) SYRINGE
PREFILLED_SYRINGE | INTRAVENOUS | Status: AC
  Filled 2018-07-21: qty 10

## 2018-07-21 MED ORDER — OXYCODONE HCL 5 MG PO TABS
5.0000 mg | ORAL_TABLET | ORAL | Status: DC | PRN
  Administered 2018-07-21 – 2018-07-25 (×3): 5 mg via ORAL
  Filled 2018-07-21 (×3): qty 1

## 2018-07-21 SURGICAL SUPPLY — 45 items
CHLORAPREP W/TINT 26ML (MISCELLANEOUS) ×2 IMPLANT
CLOTH BEACON ORANGE TIMEOUT ST (SAFETY) ×2 IMPLANT
COVER LIGHT HANDLE STERIS (MISCELLANEOUS) ×4 IMPLANT
DRAPE WARM FLUID 44X44 (DRAPE) ×2 IMPLANT
DRSG OPSITE POSTOP 4X10 (GAUZE/BANDAGES/DRESSINGS) ×2 IMPLANT
ELECT REM PT RETURN 9FT ADLT (ELECTROSURGICAL) ×2
ELECTRODE REM PT RTRN 9FT ADLT (ELECTROSURGICAL) ×1 IMPLANT
GAUZE SPONGE 4X4 12PLY STRL (GAUZE/BANDAGES/DRESSINGS) ×2 IMPLANT
GLOVE BIO SURGEON STRL SZ 6.5 (GLOVE) ×2 IMPLANT
GLOVE BIOGEL PI IND STRL 6.5 (GLOVE) ×1 IMPLANT
GLOVE BIOGEL PI IND STRL 7.0 (GLOVE) ×2 IMPLANT
GLOVE BIOGEL PI INDICATOR 6.5 (GLOVE) ×2
GLOVE BIOGEL PI INDICATOR 7.0 (GLOVE) ×2
GLOVE SURG SS PI 7.5 STRL IVOR (GLOVE) ×2 IMPLANT
GOWN STRL REUS W/TWL LRG LVL3 (GOWN DISPOSABLE) ×6 IMPLANT
HANDLE SUCTION POOLE (INSTRUMENTS) ×1 IMPLANT
INST SET MAJOR GENERAL (KITS) ×2 IMPLANT
KIT TURNOVER KIT A (KITS) ×2 IMPLANT
LIGASURE IMPACT 36 18CM CVD LR (INSTRUMENTS) ×2 IMPLANT
MANIFOLD NEPTUNE II (INSTRUMENTS) ×2 IMPLANT
NDL HYPO 18GX1.5 BLUNT FILL (NEEDLE) ×1 IMPLANT
NDL HYPO 21X1.5 SAFETY (NEEDLE) ×1 IMPLANT
NEEDLE HYPO 18GX1.5 BLUNT FILL (NEEDLE) ×2 IMPLANT
NEEDLE HYPO 21X1.5 SAFETY (NEEDLE) ×2 IMPLANT
NS IRRIG 1000ML POUR BTL (IV SOLUTION) ×4 IMPLANT
PACK ABDOMINAL MAJOR (CUSTOM PROCEDURE TRAY) ×2 IMPLANT
PAD ARMBOARD 7.5X6 YLW CONV (MISCELLANEOUS) ×2 IMPLANT
RELOAD LINEAR CUT PROX 55 BLUE (ENDOMECHANICALS) ×4 IMPLANT
RELOAD STAPLE 55 3.8 BLU REG (ENDOMECHANICALS) IMPLANT
SET BASIN LINEN APH (SET/KITS/TRAYS/PACK) ×2 IMPLANT
SPONGE LAP 18X18 RF (DISPOSABLE) ×2 IMPLANT
STAPLER GUN LINEAR PROX 60 (STAPLE) ×1 IMPLANT
STAPLER PROXIMATE 55 BLUE (STAPLE) ×1 IMPLANT
STAPLER VISISTAT (STAPLE) ×2 IMPLANT
SUCTION POOLE HANDLE (INSTRUMENTS) ×2
SUT CHROMIC 0 SH (SUTURE) IMPLANT
SUT CHROMIC 2 0 SH (SUTURE) ×1 IMPLANT
SUT PDS AB CT VIOLET #0 27IN (SUTURE) ×4 IMPLANT
SUT PROLENE 2 0 SH 30 (SUTURE) ×1 IMPLANT
SUT SILK 2 0 (SUTURE)
SUT SILK 2-0 18XBRD TIE 12 (SUTURE) ×1 IMPLANT
SUT SILK 3 0 SH CR/8 (SUTURE) ×1 IMPLANT
SYR 20CC LL (SYRINGE) ×2 IMPLANT
TRAY FOLEY W/BAG SLVR 16FR (SET/KITS/TRAYS/PACK) ×1
TRAY FOLEY W/BAG SLVR 16FR ST (SET/KITS/TRAYS/PACK) IMPLANT

## 2018-07-21 NOTE — Progress Notes (Signed)
[facility]  Minor bloody fluid from incision. Dressing saturated. Removed. Held pressure. Chloraprep and replaced sterile bandage. Small skin bleeder likely with fluid mixed.  Monitor.  Algis Greenhouse, MD [facility] [address]ella R[address] [place], [place] [address] [phone] (office)

## 2018-07-21 NOTE — Anesthesia Procedure Notes (Signed)
Procedure Name: Intubation Date/Time: 07/21/2018 1:12 PM Performed by: Charmaine Downs, CRNA Pre-anesthesia Checklist: Patient identified, Timeout performed, Emergency Drugs available, Suction available and Patient being monitored Patient Re-evaluated:Patient Re-evaluated prior to induction Oxygen Delivery Method: Circle system utilized Preoxygenation: Pre-oxygenation with 100% oxygen Induction Type: IV induction, Rapid sequence and Cricoid Pressure applied Ventilation: Mask ventilation without difficulty Laryngoscope Size: Mac and 3 Grade View: Grade II Tube size: 7.0 mm Number of attempts: 1 Airway Equipment and Method: Stylet Placement Confirmation: ETT inserted through vocal cords under direct vision,  positive ETCO2 and breath sounds checked- equal and bilateral Secured at: 22 cm Tube secured with: Tape Dental Injury: Teeth and Oropharynx as per pre-operative assessment  Future Recommendations: Recommend- induction with short-acting agent, and alternative techniques readily available

## 2018-07-21 NOTE — Interval H&P Note (Signed)
History and Physical Interval Note:  07/21/2018 12:06 PM  [name] Boyle  has presented today for surgery, with the diagnosis of small bowel obstruction  The various methods of treatment have been discussed with the patient and family. After consideration of risks, benefits and other options for treatment, the patient has consented to  Procedure(s): EXPLORATORY LAPAROTOMY (N/A) as a surgical intervention .  The patient's history has been reviewed, patient examined, no change in status, stable for surgery.  I have reviewed the patient's chart and labs.  Questions were answered to the patient's satisfaction.    No additional questions.   [name]

## 2018-07-21 NOTE — Anesthesia Postprocedure Evaluation (Signed)
Anesthesia Post Note  Patient: Josemiguel Gries  Procedure(s) Performed: EXPLORATORY LAPAROTOMY, SMALL BOWEL RESECTION (N/A Abdomen) LYSIS OF ADHESION (N/A Abdomen)  Patient location during evaluation: PACU Anesthesia Type: General Level of consciousness: awake and patient uncooperative Pain management: pain level controlled Vital Signs Assessment: post-procedure vital signs reviewed and stable Respiratory status: spontaneous breathing, nonlabored ventilation and respiratory function stable Cardiovascular status: blood pressure returned to baseline Postop Assessment: patient able to bend at knees Anesthetic complications: no     Last Vitals:  Vitals:   07/21/18 1530 07/21/18 1542  BP: (!) 154/85 138/82  Pulse: (!) 49 (!) 58  Resp: 18 18  Temp:  36.6 C  SpO2:  100%    Last Pain:  Vitals:   07/21/18 1530  TempSrc:   PainSc: 3                  Cherre Kothari J

## 2018-07-21 NOTE — Progress Notes (Signed)
PROGRESS NOTE  [name] Boyle ZOX:[id number] DOB: 18-Jan-1951 DOA: 07/17/2018 PCP: Pearson Grippe, MD  Brief Narrative: [name] Boyle is a 67 year old male with past medical history of type 2 diabetes mellitus GERD hyperlipidemia hypertension recurrent small bowel obstruction.  He said he has not made a bowel movement but he is passing a lot of flatus.  He has been scheduled for exploratory surgery tomorrow   Interval history/Subjective: Patient's blood sugar dropped he is n.p.o. except  liquids full.   July 21, 2018  Patient was taken to operating room for exploratory laparotomy by Dr. Henreitta Leber patient and she says she found a small stricture which she corrected.  Patient is comfortable has a little bit of pain but he does get out of OR.  He can start ice chips tonight  Assessment/Plan #1 recurrent small bowel obstruction patient is passing flatus he is moving around without any problem.  He had exploratory laparotomy today 2.  GERD continue home Protonix 3.  Hypertension controlled continue current medications 4.  Type 2 diabetes mellitus we will monitor blood sugars and use sliding scale for coverage 5.  Hyperlipidemia his atorvastatin is on hold    DVT prophylaxis: SCD Code Status: Full Family Communication: None at bedside Disposition Plan: Home  Dr. Barrie Folk [facility] Pager [phone]  07/21/2018, 7:15 PM  LOS: 4 days   Consultants:  General surgery  Procedures:  NG tube placement and removal  Exploratory laparotomy  Antimicrobials:  None   Objective: Vitals:  Vitals:   07/21/18 1542 07/21/18 1621  BP: 138/82 117/76  Pulse: (!) 58 (!) 50  Resp: 18   Temp: 97.8 F (36.6 C)   SpO2: 100% 100%    Exam:  Constitutional:  . Appears calm and comfortable Eyes:  . pupils and irises appear normal . Normal lids and conjunctivae ENMT:  . grossly normal hearing  . Lips appear normal . external ears, nose appear normal . Oropharynx: mucosa, tongue,posterior  pharynx appear normal Neck:  . neck appears normal, no masses, normal ROM, supple . no thyromegaly Respiratory:  . CTA bilaterally, no w/r/r.  . Respiratory effort normal. No retractions or accessory muscle use Cardiovascular:  . RRR, no m/r/g . No LE extremity edema   . Normal pedal pulses Abdomen:  . Abdomen has surgical dressing slightly soaked tender not distended No hernias . No HSM Musculoskeletal:  . Digits/nails BUE: no clubbing, cyanosis, petechiae, infection . exam of joints, bones, muscles of at least one of following: head/neck, RUE, LUE, RLE, LLE   o strength and tone normal, no atrophy, no abnormal movements o No tenderness, masses o Normal ROM, no contractures  . gait and station Skin:  . No rashes, lesions, ulcers . palpation of skin: no induration or nodules Neurologic:  . CN 2-12 intact . Sensation all 4 extremities intact Psychiatric:  . Mental status o Mood, affect appropriate o Orientation to person, place, time  . judgment and insight appear intact     I have personally reviewed the following:   Data: Results for [name] Boyle, [name] Boyle (MRN [id number]) as of 07/21/2018 19:21  Ref. Range 07/21/2018 04:50  BASIC METABOLIC PANEL Unknown Rpt (A)  Sodium Latest Ref Range: 135 - 145 mmol/L 141  Potassium Latest Ref Range: 3.5 - 5.1 mmol/L 4.0  Chloride Latest Ref Range: 98 - 111 mmol/L 109  CO2 Latest Ref Range: 22 - 32 mmol/L 26  Glucose Latest Ref Range: 70 - 99 mg/dL 88  BUN Latest Ref Range: 8 - 23 mg/dL 6 (  L)  Creatinine Latest Ref Range: 0.61 - 1.24 mg/dL 1.61  Calcium Latest Ref Range: 8.9 - 10.3 mg/dL 9.2  Anion gap Latest Ref Range: 5 - 15  6  GFR, Est Non African American Latest Ref Range: >60 mL/min >60  GFR, Est African American Latest Ref Range: >60 mL/min >60  WBC Latest Ref Range: 4.0 - 10.5 K/uL 5.4  RBC Latest Ref Range: 4.22 - 5.81 MIL/uL 3.73 (L)  Hemoglobin Latest Ref Range: 13.0 - 17.0 g/dL 09.6 (L)  HCT Latest Ref Range: 39.0 - 52.0 %  34.8 (L)  MCV Latest Ref Range: 80.0 - 100.0 fL 93.3  MCH Latest Ref Range: 26.0 - 34.0 pg 30.6  MCHC Latest Ref Range: 30.0 - 36.0 g/dL 04.5  RDW Latest Ref Range: 11.5 - 15.5 % 13.6  Platelets Latest Ref Range: 150 - 400 K/uL 246  nRBC Latest Ref Range: 0.0 - 0.2 % 0.0  Neutrophils Latest Units: % 55  Lymphocytes Latest Units: % 30  Monocytes Relative Latest Units: % 11  Eosinophil Latest Units: % 3  Basophil Latest Units: % 1  Immature Granulocytes Latest Units: % 0  NEUT# Latest Ref Range: 1.7 - 7.7 K/uL 3.0  Lymphocyte # Latest Ref Range: 0.7 - 4.0 K/uL 1.6  Monocyte # Latest Ref Range: 0.1 - 1.0 K/uL 0.6  Eosinophils Absolute Latest Ref Range: 0.0 - 0.5 K/uL 0.2  Basophils Absolute Latest Ref Range: 0.0 - 0.1 K/uL 0.0  Abs Immature Granulocytes Latest Ref Range: 0.00 - 0.07 K/uL 0.00  .   Scheduled Meds: . amLODipine  10 mg Oral q morning - 10a  . azelastine  1 spray Each Nare q morning - 10a  . enalapril  10 mg Oral BID  . fluticasone  2 spray Each Nare q morning - 10a  . ketorolac  30 mg Intravenous Q6H  . mupirocin ointment  1 application Nasal BID  . pantoprazole  40 mg Oral q morning - 10a   Continuous Infusions: . 0.9 % NaCl with KCl 40 mEq / L 50 mL/hr (07/21/18 0524)  . famotidine (PEPCID) IV 20 mg (07/21/18 1016)    Principal Problem:   SBO (small bowel obstruction) (HCC) Active Problems:   Gastroesophageal reflux disease without esophagitis   Hypertension   Type 2 diabetes mellitus (HCC)   Hyperlipidemia   Intestinal adhesions with partial obstruction (HCC)   LOS: 4 days

## 2018-07-21 NOTE — Transfer of Care (Signed)
Immediate Anesthesia Transfer of Care Note  Patient: [name] Boyle  Procedure(s) Performed: EXPLORATORY LAPAROTOMY, SMALL BOWEL RESECTION (N/A Abdomen) LYSIS OF ADHESION (N/A Abdomen)  Patient Location: PACU  Anesthesia Type:General  Level of Consciousness: awake and patient cooperative  Airway & Oxygen Therapy: Patient Spontanous Breathing and Patient connected to nasal cannula oxygen  Post-op Assessment: Report given to RN, Post -op Vital signs reviewed and stable and Patient moving all extremities  Post vital signs: Reviewed and stable  Last Vitals:  Vitals Value Taken Time  BP    Temp    Pulse    Resp    SpO2      Last Pain:  Vitals:   07/21/18 1118  TempSrc: Oral  PainSc: 0-No pain         Complications: No apparent anesthesia complications

## 2018-07-21 NOTE — Progress Notes (Signed)
Pt dressing saturated with blood and is actively leaking through to abdominal binder. Dr. Henreitta Leber made aware, stated she would come look at the dressing once she is out of surgery. Abdominal binder reinforced and patient cleaned up. Vitals WNL. Will continue to monitor.

## 2018-07-21 NOTE — Progress Notes (Signed)
[facility]  Ex lap, small bowel resection  PRN pain meds ordered Toradol scheduled  Abdominal binder Sips and ice chips ok tonight Ambulate   Updated Dr. Barrie Folk.   Algis Greenhouse, MD [facility] [address] [place], [place] [address] [phone] (office)

## 2018-07-21 NOTE — Anesthesia Preprocedure Evaluation (Signed)
Anesthesia Evaluation    Airway Mallampati: II       Dental  (+) Teeth Intact   Pulmonary    breath sounds clear to auscultation       Cardiovascular hypertension, On Medications  Rhythm:regular     Neuro/Psych    GI/Hepatic GERD  ,  Endo/Other  diabetes, Type 2  Renal/GU      Musculoskeletal   Abdominal   Peds  Hematology   Anesthesia Other Findings Recurring SBO since September NSR 70  Reproductive/Obstetrics                             Anesthesia Physical Anesthesia Plan  ASA: III  Anesthesia Plan: General   Post-op Pain Management:    Induction:   PONV Risk Score and Plan:   Airway Management Planned:   Additional Equipment:   Intra-op Plan:   Post-operative Plan:   Informed Consent:   Plan Discussed with: Anesthesiologist  Anesthesia Plan Comments:         Anesthesia Quick Evaluation

## 2018-07-21 NOTE — Op Note (Signed)
[facility] Operative Note  07/21/18  Preoperative Diagnosis:  Small bowel obstruction    Postoperative Diagnosis: Same   Procedure(s) Performed:  Exploratory laparotomy, lysis of adhesions, resection of small bowel and primary stapled anastomosis    Surgeon: [name] Jewels. Henreitta Leber, MD   Assistants: [name] Macho, MD     Anesthesia: General endotracheal   Anesthesiologist: Dr. Molli Boyle     Specimens: Small bowel    Estimated Blood Loss: Minimal   Blood Replacement: None    Complications: None   Wound Class: Contaminated   Operative Indications: [name] Boyle is a 67 yo with recurrent SBO that has presented to the hospital 3 times in the last month with the same area of obstruction on his CT scan. 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  This surgery was 1 year ago approximately.  Given the recurrent obstructions, it is unlikely that this will resolve without intervention, and after a discussion of the risk and benefits of surgery including but not limited to bleeding, infection, need for bowel resection, risk of forming more scar tissue, he opted to proceed.   Findings: Proximal ileum adherent to the anterior abdominal wall with granulation/ old ulcerated area scarred down and strictured in this area; proximal bowel dilated and distal bowel decompressed; some minor interloop adhesions    Procedure: The patient was taken to the operating room and placed supine. General endotracheal anesthesia was induced. Intravenous antibiotics were  administered per protocol.  An orogastric tube positioned to decompress the stomach. The abdomen was prepared and draped in the usual sterile fashion.   The patient's prior laparotomy scar was used and carried down through the subcutaneous tissue  with electrocautery.  The fascia was opened with great care using sharp dissection with a knife, and entry into the abdomen was achieved.  In doing this we noted a loop of small bowel adherent to the anterior abdominal wall under the previous suture line and an enterotomy had been made which was oversewed with a 2-0 Silk to control any contamination.  This loop of bowel was carefully dissected off the anterior abdominal wall with scissors, and a segment of omentum was dissected off the anteriora abdominal wall.  The small bowel was ran and some minor interloop adhesions were taken down with scissors carefully.  The proximal bowel was dilated and the distal bowel from the point that had been adherent to the anterior abdominal wall was decompressed. The point of obstruction was from an area of the bowel that had been adhered to the anterior abdominal wall where a granuloma/ old ulcerated area appeared possibly from some past perforation.  This area was surrounded by stenotic scar.  Given this stenosis, the bowel small bowel was resected with two linear cutting stapler bowel loads, and the proximal and distal segments were anastomosed with a linear cutting stapler and a TIA load. The staple line was oversewn with 2-0 Silk interrupted Lembert suture, and a 2-0 Silk was placed in the crux of the common staple line.  The bowel was placed back into the abdomen in anatomic position without any twisting.  The colon and omentum were pulled down over the small bowel.   The abdomen was closed with a running 0 PDS suture, and the skin was closed with staples.  [name] Boyle was assisting  throughout the procedure and was present for the critical portions of the case.   Final inspection revealed acceptable hemostasis. All counts were correct at the end of the case. The patient was awakened from anesthesia and extubate without complication.  The patient went to the PACU in stable condition.  The patient's foley and OG were  removed.    Algis Greenhouse, MD [facility] [address] [place] [place], [place] [address] [phone] (office)

## 2018-07-22 ENCOUNTER — Encounter (HOSPITAL_COMMUNITY): Payer: Self-pay | Admitting: General Surgery

## 2018-07-22 LAB — CBC WITH DIFFERENTIAL/PLATELET
ABS IMMATURE GRANULOCYTES: 0.02 10*3/uL (ref 0.00–0.07)
BASOS PCT: 0 %
Basophils Absolute: 0 10*3/uL (ref 0.0–0.1)
EOS ABS: 0.1 10*3/uL (ref 0.0–0.5)
Eosinophils Relative: 1 %
HCT: 36.2 % — ABNORMAL LOW (ref 39.0–52.0)
Hemoglobin: 11.9 g/dL — ABNORMAL LOW (ref 13.0–17.0)
IMMATURE GRANULOCYTES: 0 %
LYMPHS ABS: 0.9 10*3/uL (ref 0.7–4.0)
Lymphocytes Relative: 9 %
MCH: 31.6 pg (ref 26.0–34.0)
MCHC: 32.9 g/dL (ref 30.0–36.0)
MCV: 96 fL (ref 80.0–100.0)
Monocytes Absolute: 0.7 10*3/uL (ref 0.1–1.0)
Monocytes Relative: 7 %
NEUTROS ABS: 7.9 10*3/uL — AB (ref 1.7–7.7)
NEUTROS PCT: 83 %
NRBC: 0 % (ref 0.0–0.2)
PLATELETS: 241 10*3/uL (ref 150–400)
RBC: 3.77 MIL/uL — ABNORMAL LOW (ref 4.22–5.81)
RDW: 13.9 % (ref 11.5–15.5)
WBC: 9.6 10*3/uL (ref 4.0–10.5)

## 2018-07-22 LAB — GLUCOSE, CAPILLARY
GLUCOSE-CAPILLARY: 93 mg/dL (ref 70–99)
Glucose-Capillary: 102 mg/dL — ABNORMAL HIGH (ref 70–99)
Glucose-Capillary: 97 mg/dL (ref 70–99)

## 2018-07-22 MED ORDER — ENOXAPARIN SODIUM 40 MG/0.4ML ~~LOC~~ SOLN
40.0000 mg | SUBCUTANEOUS | Status: DC
  Administered 2018-07-23 – 2018-07-25 (×3): 40 mg via SUBCUTANEOUS
  Filled 2018-07-22 (×4): qty 0.4

## 2018-07-22 MED ORDER — KETOROLAC TROMETHAMINE 30 MG/ML IJ SOLN
15.0000 mg | Freq: Four times a day (QID) | INTRAMUSCULAR | Status: DC
  Administered 2018-07-22 – 2018-07-24 (×8): 15 mg via INTRAVENOUS
  Filled 2018-07-22 (×8): qty 1

## 2018-07-22 NOTE — Progress Notes (Signed)
Patient with continued drainage from incision throughout night. Soaked through half of an ABD pad. New ABD in place as well as abdominal binder. Educated patient regarding pain control, ambulation, deep breathing, and splinting incision. Will continue to monitor.

## 2018-07-22 NOTE — Care Management Note (Signed)
Case Management Note  Patient Details  Name: [name] Boyle MRN: [id number] Date of Birth: 1950-10-01  If discussed at Long Length of Stay Meetings, dates discussed:  07/22/18  Additional Comments:  Malcolm Metrohildress, Coen Miyasato Demske, RN 07/22/2018, 1:20 PM

## 2018-07-22 NOTE — Progress Notes (Signed)
PROGRESS NOTE  [name] Boyle RUE:[id number] DOB: 25-Mar-1951 DOA: 07/17/2018 PCP: Pearson GrippeKim, Trei, MD  Brief Narrative: [name] GermanJames Ivey is a 67 year old male with past medical history of type 2 diabetes mellitus GERD hyperlipidemia hypertension recurrent small bowel obstruction.  He is postop exploratory surgery 07/21/18.   Interval history/Subjective: Patient says that he feels well.  No flatus.  He is hearing bowel sounds.  Still with some drainage from wound overnight.  No fever.   Assessment/Plan 1. Recurrent small bowel obstruction - POD#1 s/p exploratory lap.  Pt is progressing.  2. GERD - stable, controlled,  continue home Protonix.   3. Hypertension controlled continue current medications.   4. Type 2 diabetes mellitus we will monitor blood sugars and use sliding scale for coverage.   5. Hyperlipidemia his atorvastatin is on hold, plan to resume when he is fully on a diet again.   His lipids have been stable.   DVT prophylaxis: SCD Code Status: Full Family Communication: None at bedside Disposition Plan: [facility]  C. Leauna Sharber, MD [facility] Pager [phone]    07/22/2018, 10:30 AM  LOS: 5 days   Consultants:  General surgery Henreitta Leber([place])   Procedures:  NG tube placement and removal  Exploratory laparotomy 07/21/18  Antimicrobials: Anti-infectives (From admission, onward)   Start     Dose/Rate Route Frequency Ordered Stop   07/21/18 0600  cefoTEtan (CEFOTAN) 2 g in sodium chloride 0.9 % 100 mL IVPB     2 g 200 mL/hr over 30 Minutes Intravenous On call to O.R. 07/20/18 1559 07/21/18 1307      Objective: Vitals:  Vitals:   07/22/18 0545 07/22/18 0819  BP: 105/69   Pulse: (!) 57   Resp:    Temp: 98.9 F (37.2 C)   SpO2: 97% 94%   Exam:  Constitutional:  . Appears calm and comfortable Eyes:  . pupils and irises appear normal . Normal lids and conjunctivae ENMT:  . grossly normal hearing  . Lips appear normal . external ears, nose appear normal . Oropharynx:  mucosa, tongue,posterior pharynx appear normal Neck:  . neck appears normal, no masses, normal ROM, supple . no thyromegaly Respiratory:  . CTA bilaterally, no w/r/r.  . Respiratory effort normal. No retractions or accessory muscle use Cardiovascular:  . RRR, no m/r/g . No LE extremity edema   . Normal pedal pulses Abdomen:  . Abdomen has surgical dressing slightly soaked tender not distended small amount of serous drainage No hernias . No HSM.   Musculoskeletal:  . Digits/nails BUE: no clubbing, cyanosis, petechiae, infection . exam of joints, bones, muscles of at least one of following: head/neck, RUE, LUE, RLE, LLE   o strength and tone normal, no atrophy, no abnormal movements o No tenderness, masses o Normal ROM, no contractures  Skin:  . No rashes, lesions, ulcers . palpation of skin: no induration or nodules Neurologic:  . CN 2-12 intact . Sensation all 4 extremities intact Psychiatric:  . Mental status o Mood, affect appropriate o Orientation to person, place, time  . judgment and insight appear intact    I have personally reviewed the following:   Data: Results for [name] Boyle, [name] Boyle (MRN [id number]) as of 07/21/2018 19:21  Ref. Range 07/21/2018 04:50  BASIC METABOLIC PANEL Unknown Rpt (A)  Sodium Latest Ref Range: 135 - 145 mmol/L 141  Potassium Latest Ref Range: 3.5 - 5.1 mmol/L 4.0  Chloride Latest Ref Range: 98 - 111 mmol/L 109  CO2 Latest Ref Range: 22 - 32 mmol/L 26  Glucose  Latest Ref Range: 70 - 99 mg/dL 88  BUN Latest Ref Range: 8 - 23 mg/dL 6 (L)  Creatinine Latest Ref Range: 0.61 - 1.24 mg/dL 4.78  Calcium Latest Ref Range: 8.9 - 10.3 mg/dL 9.2  Anion gap Latest Ref Range: 5 - 15  6  GFR, Est Non African American Latest Ref Range: >60 mL/min >60  GFR, Est African American Latest Ref Range: >60 mL/min >60  WBC Latest Ref Range: 4.0 - 10.5 K/uL 5.4  RBC Latest Ref Range: 4.22 - 5.81 MIL/uL 3.73 (L)  Hemoglobin Latest Ref Range: 13.0 - 17.0 g/dL 29.5 (L)    HCT Latest Ref Range: 39.0 - 52.0 % 34.8 (L)  MCV Latest Ref Range: 80.0 - 100.0 fL 93.3  MCH Latest Ref Range: 26.0 - 34.0 pg 30.6  MCHC Latest Ref Range: 30.0 - 36.0 g/dL 62.1  RDW Latest Ref Range: 11.5 - 15.5 % 13.6  Platelets Latest Ref Range: 150 - 400 K/uL 246  nRBC Latest Ref Range: 0.0 - 0.2 % 0.0  Neutrophils Latest Units: % 55  Lymphocytes Latest Units: % 30  Monocytes Relative Latest Units: % 11  Eosinophil Latest Units: % 3  Basophil Latest Units: % 1  Immature Granulocytes Latest Units: % 0  NEUT# Latest Ref Range: 1.7 - 7.7 K/uL 3.0  Lymphocyte # Latest Ref Range: 0.7 - 4.0 K/uL 1.6  Monocyte # Latest Ref Range: 0.1 - 1.0 K/uL 0.6  Eosinophils Absolute Latest Ref Range: 0.0 - 0.5 K/uL 0.2  Basophils Absolute Latest Ref Range: 0.0 - 0.1 K/uL 0.0  Abs Immature Granulocytes Latest Ref Range: 0.00 - 0.07 K/uL 0.00  .   Scheduled Meds: . amLODipine  10 mg Oral q morning - 10a  . azelastine  1 spray Each Nare q morning - 10a  . enalapril  10 mg Oral BID  . fluticasone  2 spray Each Nare q morning - 10a  . ketorolac  15 mg Intravenous Q6H  . mupirocin ointment  1 application Nasal BID  . pantoprazole  40 mg Oral q morning - 10a   Continuous Infusions: . 0.9 % NaCl with KCl 40 mEq / L 50 mL/hr (07/22/18 0357)  . famotidine (PEPCID) IV 20 mg (07/22/18 0902)    Principal Problem:   SBO (small bowel obstruction) (HCC) Active Problems:   Gastroesophageal reflux disease without esophagitis   Hypertension   Type 2 diabetes mellitus (HCC)   Hyperlipidemia   Intestinal adhesions with partial obstruction (HCC)   LOS: 5 days

## 2018-07-22 NOTE — Progress Notes (Signed)
[facility] Progress Note  1 Day Post-Op  Subjective: No major changes. Drainage from wound subsided. Dressing is drying out, foam with some staining. No additional drainage on ABD. Walking in halls. No flatus.   Objective: Vital signs in last 24 hours: Temp:  [97.6 F (36.4 C)-98.9 F (37.2 C)] 98.9 F (37.2 C) (11/12 0545) Pulse Rate:  [49-73] 57 (11/12 0545) Resp:  [8-18] 18 (11/11 1542) BP: (105-177)/(69-91) 105/69 (11/12 0545) SpO2:  [94 %-100 %] 94 % (11/12 0819) Last BM Date: 07/17/18  Intake/Output from previous day: 11/11 0701 - 11/12 0700 In: 1200 [I.V.:1200] Out: 935 [Urine:300; Blood:635] Intake/Output this shift: No intake/output data recorded.  General appearance: alert, cooperative and no distress Resp: normal work breathing GI: soft, appropriately tender, honeycomb foam with some staining of blood, remaining incision dry and intact  Lab Results:  Recent Labs    07/21/18 0450 07/22/18 0511  WBC 5.4 9.6  HGB 11.4* 11.9*  HCT 34.8* 36.2*  PLT 246 241   BMET Recent Labs    07/21/18 0450  NA 141  K 4.0  CL 109  CO2 26  GLUCOSE 88  BUN 6*  CREATININE 1.06  CALCIUM 9.2    Anti-infectives: Anti-infectives (From admission, onward)   Start     Dose/Rate Route Frequency Ordered Stop   07/21/18 0600  cefoTEtan (CEFOTAN) 2 g in sodium chloride 0.9 % 100 mL IVPB     2 g 200 mL/hr over 30 Minutes Intravenous On call to O.R. 07/20/18 1559 07/21/18 1307      Assessment/Plan: [name] is a 67 yo s/p Ex lap, SBR with lysis of adhesion. He is doing well. Drainage from wound overnight improved. Likely had a small hematoma/ seroma form.  PRN for pain and scheduled toradol Sips/ ice until has flatus Ambulate/ lovenox prophylaxis added given surgery  H&H stable   LOS: 5 days    Lucretia RoersLindsay C Rae Sutcliffe 07/22/2018

## 2018-07-23 ENCOUNTER — Encounter (HOSPITAL_COMMUNITY): Payer: Self-pay | Admitting: Family Medicine

## 2018-07-23 LAB — GLUCOSE, CAPILLARY
GLUCOSE-CAPILLARY: 88 mg/dL (ref 70–99)
Glucose-Capillary: 97 mg/dL (ref 70–99)
Glucose-Capillary: 111 mg/dL — ABNORMAL HIGH (ref 70–99)

## 2018-07-23 MED ORDER — DOCUSATE SODIUM 100 MG PO CAPS
100.0000 mg | ORAL_CAPSULE | Freq: Two times a day (BID) | ORAL | Status: DC
  Administered 2018-07-23 – 2018-07-26 (×7): 100 mg via ORAL
  Filled 2018-07-23 (×6): qty 1

## 2018-07-23 NOTE — Progress Notes (Signed)
PROGRESS NOTE  [name] Boyle [id number] DOB: 03-10-1951 DOA: 07/17/2018 PCP: Pearson GrippeKim, Demarquez, MD  Brief Narrative: [name] Boyle is a 67 year old male with past medical history of type 2 diabetes mellitus GERD hyperlipidemia hypertension recurrent small bowel obstruction.  He is postop exploratory surgery 07/21/18.   Interval history/Subjective: Patient says he feels better and passing flatus.  No BM yet.  He has no further leaking from abdomen.   Assessment/Plan 1. Recurrent small bowel obstruction - POD#2 s/p exploratory lap.  Pt is progressing. Pt having flatus now. Consulted care manager for home health needs.   2. GERD - stable, controlled,  continue home Protonix.   3. Hypertension - well controlled continue current medications.   4. Type 2 diabetes mellitus we will monitor blood sugars and use sliding scale for coverage.   5. Hyperlipidemia his atorvastatin is on hold, plan to resume when he is fully on a diet again.   His lipids have been stable.   DVT prophylaxis: SCD Code Status: Full Family Communication: None at bedside [facility] Plan: [facility]  C. Noa Constante, MD [facility] Pager [phone]    07/23/2018, 11:01 AM  LOS: 6 days   Consultants:  General surgery Henreitta Leber([place])   Procedures:  NG tube placement and removal  Exploratory laparotomy 07/21/18  Antimicrobials: Anti-infectives (From admission, onward)   Start     Dose/Rate Route Frequency Ordered Stop   07/21/18 0600  cefoTEtan (CEFOTAN) 2 g in sodium chloride 0.9 % 100 mL IVPB     2 g 200 mL/hr over 30 Minutes Intravenous On call to O.R. 07/20/18 1559 07/21/18 1307     Objective: Vitals:  Vitals:   07/23/18 0559 07/23/18 0815  BP: 134/76   Pulse: 63   Resp:    Temp: 98.5 F (36.9 C)   SpO2: 98% 98%   Exam:  Constitutional:  . Appears calm and comfortable Eyes:  . pupils and irises appear normal . Normal lids and conjunctivae ENMT:  . grossly normal hearing  . Lips appear normal . external  ears, nose appear normal . Oropharynx: mucosa, tongue,posterior pharynx appear normal Neck:  . neck appears normal, no masses, normal ROM, supple . no thyromegaly Respiratory:  . CTA bilaterally, no w/r/r.  . Respiratory effort normal. No retractions or accessory muscle use Cardiovascular:  . RRR, no m/r/g . No LE extremity edema   . Normal pedal pulses Abdomen:  . Abdomen soft, bowel sounds heard,  not distended, no drainage seen. No masses palpated.   . No HSM.   Musculoskeletal:  . Digits/nails BUE: no clubbing, cyanosis, petechiae, infection . exam of joints, bones, muscles of at least one of following: head/neck, RUE, LUE, RLE, LLE   o strength and tone normal, no atrophy, no abnormal movements o No tenderness, masses o Normal ROM, no contractures  Skin:  . No rashes, lesions, ulcers . palpation of skin: no induration or nodules Neurologic:  . CN 2-12 intact . Sensation all 4 extremities intact Psychiatric:  . Mental status o Mood, affect appropriate o Orientation to person, place, time  . judgment and insight appear intact    I have personally reviewed the following:   Data: Results for [name] GermanMILLS, [name] (MRN [id number]) as of 07/21/2018 19:21  Ref. Range 07/21/2018 04:50  BASIC METABOLIC PANEL Unknown Rpt (A)  Sodium Latest Ref Range: 135 - 145 mmol/L 141  Potassium Latest Ref Range: 3.5 - 5.1 mmol/L 4.0  Chloride Latest Ref Range: 98 - 111 mmol/L 109  CO2 Latest Ref Range: 22 -  32 mmol/L 26  Glucose Latest Ref Range: 70 - 99 mg/dL 88  BUN Latest Ref Range: 8 - 23 mg/dL 6 (L)  Creatinine Latest Ref Range: 0.61 - 1.24 mg/dL 8.29  Calcium Latest Ref Range: 8.9 - 10.3 mg/dL 9.2  Anion gap Latest Ref Range: 5 - 15  6  GFR, Est Non African American Latest Ref Range: >60 mL/min >60  GFR, Est African American Latest Ref Range: >60 mL/min >60  WBC Latest Ref Range: 4.0 - 10.5 K/uL 5.4  RBC Latest Ref Range: 4.22 - 5.81 MIL/uL 3.73 (L)  Hemoglobin Latest Ref Range: 13.0 -  17.0 g/dL 56.2 (L)  HCT Latest Ref Range: 39.0 - 52.0 % 34.8 (L)  MCV Latest Ref Range: 80.0 - 100.0 fL 93.3  MCH Latest Ref Range: 26.0 - 34.0 pg 30.6  MCHC Latest Ref Range: 30.0 - 36.0 g/dL 13.0  RDW Latest Ref Range: 11.5 - 15.5 % 13.6  Platelets Latest Ref Range: 150 - 400 K/uL 246  nRBC Latest Ref Range: 0.0 - 0.2 % 0.0  Neutrophils Latest Units: % 55  Lymphocytes Latest Units: % 30  Monocytes Relative Latest Units: % 11  Eosinophil Latest Units: % 3  Basophil Latest Units: % 1  Immature Granulocytes Latest Units: % 0  NEUT# Latest Ref Range: 1.7 - 7.7 K/uL 3.0  Lymphocyte # Latest Ref Range: 0.7 - 4.0 K/uL 1.6  Monocyte # Latest Ref Range: 0.1 - 1.0 K/uL 0.6  Eosinophils Absolute Latest Ref Range: 0.0 - 0.5 K/uL 0.2  Basophils Absolute Latest Ref Range: 0.0 - 0.1 K/uL 0.0  Abs Immature Granulocytes Latest Ref Range: 0.00 - 0.07 K/uL 0.00  .   Scheduled Meds: . amLODipine  10 mg Oral q morning - 10a  . azelastine  1 spray Each Nare q morning - 10a  . enalapril  10 mg Oral BID  . enoxaparin (LOVENOX) injection  40 mg Subcutaneous Q24H  . fluticasone  2 spray Each Nare q morning - 10a  . ketorolac  15 mg Intravenous Q6H  . mupirocin ointment  1 application Nasal BID  . pantoprazole  40 mg Oral q morning - 10a   Continuous Infusions: . 0.9 % NaCl with KCl 40 mEq / L 50 mL/hr (07/23/18 0042)  . famotidine (PEPCID) IV 20 mg (07/23/18 0954)    Principal Problem:   SBO (small bowel obstruction) (HCC) Active Problems:   Gastroesophageal reflux disease without esophagitis   Hypertension   Type 2 diabetes mellitus (HCC)   Hyperlipidemia   Intestinal adhesions with partial obstruction (HCC)   LOS: 6 days

## 2018-07-23 NOTE — Progress Notes (Signed)
[facility] Progress Note  2 Days Post-Op  Subjective: No major issues. Having flatus. Somewhat bloated.   Objective: Vital signs in last 24 hours: Temp:  [98.4 F (36.9 C)-99.6 F (37.6 C)] 98.5 F (36.9 C) (11/13 0559) Pulse Rate:  [63-71] 63 (11/13 0559) Resp:  [17] 17 (11/12 1411) BP: (125-140)/(68-78) 134/76 (11/13 0559) SpO2:  [91 %-99 %] 98 % (11/13 0815) Last BM Date: 07/17/18  Intake/Output from previous day: 11/12 0701 - 11/13 0700 In: 120 [P.O.:120] Out: -  Intake/Output this shift: No intake/output data recorded.  General appearance: alert, cooperative and no distress Resp: normal work breathing GI: soft, appropriately tender, mildly distended, staples c/d/i with no erythema or drainage  Lab Results:  Recent Labs    07/21/18 0450 07/22/18 0511  WBC 5.4 9.6  HGB 11.4* 11.9*  HCT 34.8* 36.2*  PLT 246 241   BMET Recent Labs    07/21/18 0450  NA 141  K 4.0  CL 109  CO2 26  GLUCOSE 88  BUN 6*  CREATININE 1.06  CALCIUM 9.2   PT/INR No results for input(s): LABPROT, INR in the last 72 hours.  Studies/Results: No results found.  Anti-infectives: Anti-infectives (From admission, onward)   Start     Dose/Rate Route Frequency Ordered Stop   07/21/18 0600  cefoTEtan (CEFOTAN) 2 g in sodium chloride 0.9 % 100 mL IVPB     2 g 200 mL/hr over 30 Minutes Intravenous On call to O.R. 07/20/18 1559 07/21/18 1307      Assessment/Plan: [name] is a 67 yo POD 2 s/p Ex lap, SBR and lysis of adhesions for chronic SBO. Doing well.  Clear tray and can adv to full liquids if tolerates and has BM Colace ordered  Ambulate SCDs, lovenox   LOS: 6 days    Lucretia RoersLindsay C Tametha Banning 07/23/2018

## 2018-07-23 NOTE — Care Management Obs Status (Signed)
MEDICARE OBSERVATION STATUS NOTIFICATION   Patient Details  Name: [name] Boyle MRN: [id number] Date of Birth: [birth year]   Medicare Observation Status Notification Given:       Renie OraHawkins, Aleathea Pugmire Smith 07/23/2018, 12:01 PM

## 2018-07-23 NOTE — Care Management Important Message (Signed)
Important Message  Patient Details  Name: Dorrene GermanJames Mckiddy MRN: [id number] Date of Birth: 1951-04-13   Medicare Important Message Given:  Yes    Renie OraHawkins, Analleli Gierke Smith 07/23/2018, 11:28 AM

## 2018-07-24 LAB — GLUCOSE, CAPILLARY
GLUCOSE-CAPILLARY: 80 mg/dL (ref 70–99)
Glucose-Capillary: 126 mg/dL — ABNORMAL HIGH (ref 70–99)

## 2018-07-24 MED ORDER — FAMOTIDINE 20 MG PO TABS
20.0000 mg | ORAL_TABLET | Freq: Two times a day (BID) | ORAL | Status: DC
  Administered 2018-07-24 – 2018-07-26 (×4): 20 mg via ORAL
  Filled 2018-07-24 (×4): qty 1

## 2018-07-24 MED ORDER — IBUPROFEN 600 MG PO TABS
600.0000 mg | ORAL_TABLET | Freq: Four times a day (QID) | ORAL | Status: AC
  Administered 2018-07-24 – 2018-07-25 (×8): 600 mg via ORAL
  Filled 2018-07-24 (×8): qty 1

## 2018-07-24 MED ORDER — BISACODYL 5 MG PO TBEC
10.0000 mg | DELAYED_RELEASE_TABLET | Freq: Every day | ORAL | Status: DC | PRN
  Administered 2018-07-24: 10 mg via ORAL

## 2018-07-24 MED ORDER — ATORVASTATIN CALCIUM 40 MG PO TABS
40.0000 mg | ORAL_TABLET | Freq: Every evening | ORAL | Status: DC
  Administered 2018-07-24 – 2018-07-25 (×2): 40 mg via ORAL
  Filled 2018-07-24 (×2): qty 1

## 2018-07-24 NOTE — Progress Notes (Signed)
[facility] Progress Note  3 Days Post-Op  Subjective: On full liquids but no BM. Having a lot of gas. On stool softener. Not taking a narcotic at all. Ambulating.   Objective: Vital signs in last 24 hours: Temp:  [98.6 F (37 C)] 98.6 F (37 C) (11/14 0504) Pulse Rate:  [57-68] 57 (11/14 0504) Resp:  [16-18] 18 (11/14 0504) BP: (134-152)/(84-92) 152/92 (11/14 0504) SpO2:  [97 %-100 %] 99 % (11/14 0822) Last BM Date: 07/21/18  Intake/Output from previous day: 11/13 0701 - 11/14 0700 In: 4519.4 [P.O.:840; I.V.:2582.5; IV Piggyback:1096.9] Out: -  Intake/Output this shift: No intake/output data recorded.  General appearance: alert, cooperative and no distress Resp: normal work of breathing GI: soft, mildly distended, appropriately tender, staples c/d/i with no drainage or erythema  Lab Results:  Recent Labs    07/22/18 0511  WBC 9.6  HGB 11.9*  HCT 36.2*  PLT 241    Anti-infectives: Anti-infectives (From admission, onward)   Start     Dose/Rate Route Frequency Ordered Stop   07/21/18 0600  cefoTEtan (CEFOTAN) 2 g in sodium chloride 0.9 % 100 mL IVPB     2 g 200 mL/hr over 30 Minutes Intravenous On call to O.R. 07/20/18 1559 07/21/18 1307      Assessment/Plan: Ms. Arvilla MarketMills is a 67 yo POD 3 s/p Ex lap, SBR, LOA for SBO. Doing great. Needs to have BM.  Full liquids Colace ordered Ambulate multiple times a day  Toradol d/c and scheduled ibuprofen added Likely home in next few days once BM, going to poll other surgeons on opinion regarding patient driving stick shift 20 miles if not on narcotics and as mobile has [name] has been    LOS: 7 days    Lucretia RoersLindsay C Victorian Gunn 07/24/2018 \

## 2018-07-24 NOTE — Care Management Note (Signed)
Case Management Note  Patient Details  Name: [name] Boyle MRN: [id number] Date of Birth: Feb 22, 1951  Subjective/Objective:   SBO.                 Action/Plan: Met with patient to discuss potential home health RN. He reports now that he knows he does not have to have bandages and can even shower, that he does not feel a HH RN is needed.   CSW was consulted for potential transportation needs once discharged due to the fact that patient drives a manual and is concerned he will not be able to drive it home.  He reports he does not have any family close by that can assist him (brother is in [place]).  Patient reports he drives a BMW and the clutch is not a very hard clutch to use. He does not feel he will need any transportation and that he can drive home.   Patient to discuss with Dr. Constance Haw.   Patient is aware that cab voucher are available if needed.   Expected Discharge Date:    unk              Expected Discharge Plan:   Home  In-House Referral:   clinical social work  Discharge planning Services   CM consult  Post Acute Care Choice:    Choice offered to:   Patient  DME Arranged:    DME Agency:     HH Arranged:   patient refused [place] Agency:     Status of Service:   Completed.   If discussed at [place] of Stay Meetings, dates discussed:    Additional Comments:  [name], [name], RN 07/24/2018, 12:15 PM

## 2018-07-24 NOTE — Progress Notes (Addendum)
PROGRESS NOTE  Thiago Ragsdale UEA:[id number] DOB: 1951/06/08 DOA: 07/17/2018 PCP: Pearson Grippe, MD  Brief Narrative: Jamichael Knotts is a 67 year old male with past medical history of type 2 diabetes mellitus GERD hyperlipidemia hypertension recurrent small bowel obstruction.  He is postop exploratory surgery 07/21/18.   Interval history/Subjective: Patient says he feels better and passing flatus.  No BM yet.  He has no further leaking from abdomen.   Assessment/Plan 1. Recurrent small bowel obstruction - POD#3 s/p exploratory lap.  Pt is progressing. Pt having flatus but no BM, tolerating diet. Consulted care manager for home health needs and social worker in case he needs transportation home.    2. GERD - stable, controlled,  continue Protonix.   3. Hypertension - well controlled continue current medications.   4. Type 2 diabetes mellitus - stable.  DC CBGs.   5. Hyperlipidemia - resume home atorvastatin every evening.    DVT prophylaxis: SCD Code Status: Full Family Communication: None at bedside Disposition Plan: Home soon as he continues to progress  Maryln Manuel, MD [facility] Pager [phone]    07/24/2018, 11:00 AM  LOS: 7 days   Consultants:  General surgery Henreitta Leber)   Procedures:  NG tube placement and removal  Exploratory laparotomy 07/21/18  Antimicrobials: Anti-infectives (From admission, onward)   Start     Dose/Rate Route Frequency Ordered Stop   07/21/18 0600  cefoTEtan (CEFOTAN) 2 g in sodium chloride 0.9 % 100 mL IVPB     2 g 200 mL/hr over 30 Minutes Intravenous On call to O.R. 07/20/18 1559 07/21/18 1307     Objective: Vitals:  Vitals:   07/24/18 0504 07/24/18 0822  BP: (!) 152/92   Pulse: (!) 57   Resp: 18   Temp: 98.6 F (37 C)   SpO2: 99% 99%   Exam:  Constitutional:  . Appears calm and comfortable, sitting up in a chair Eyes:  . pupils and irises appear normal . Normal lids and conjunctivae ENMT:  . grossly normal hearing  . Lips  appear normal . external ears, nose appear normal . Oropharynx: mucosa, tongue,posterior pharynx appear normal Neck:  . neck appears normal, no masses, normal ROM, supple . no thyromegaly Respiratory:  . CTA bilaterally, no w/r/r.  . Respiratory effort normal. No retractions or accessory muscle use Cardiovascular:  . RRR, no m/r/g . No LE extremity edema   . Normal pedal pulses Abdomen:  . Abdomen soft, bowel sounds heard, bandages clean and dry, abd not distended, no drainage seen. No masses palpated.   . No HSM.   Musculoskeletal:  . Digits/nails BUE: no clubbing, cyanosis, petechiae, infection . exam of joints, bones, muscles of at least one of following: head/neck, RUE, LUE, RLE, LLE   o strength and tone normal, no atrophy, no abnormal movements o No tenderness, masses o Normal ROM, no contractures  Skin:  . No rashes, lesions, ulcers . palpation of skin: no induration or nodules Neurologic:  . CN 2-12 intact . Sensation all 4 extremities intact Psychiatric:  . Mental status o Mood, affect appropriate o Orientation to person, place, time  . judgment and insight appear intact    I have personally reviewed the following:   Data: Results for LOIC, HOBIN (MRN [id number]) as of 07/21/2018 19:21  Ref. Range 07/21/2018 04:50  BASIC METABOLIC PANEL Unknown Rpt (A)  Sodium Latest Ref Range: 135 - 145 mmol/L 141  Potassium Latest Ref Range: 3.5 - 5.1 mmol/L 4.0  Chloride Latest Ref Range: 98 - 111  mmol/L 109  CO2 Latest Ref Range: 22 - 32 mmol/L 26  Glucose Latest Ref Range: 70 - 99 mg/dL 88  BUN Latest Ref Range: 8 - 23 mg/dL 6 (L)  Creatinine Latest Ref Range: 0.61 - 1.24 mg/dL 1.611.06  Calcium Latest Ref Range: 8.9 - 10.3 mg/dL 9.2  Anion gap Latest Ref Range: 5 - 15  6  GFR, Est Non African American Latest Ref Range: >60 mL/min >60  GFR, Est African American Latest Ref Range: >60 mL/min >60  WBC Latest Ref Range: 4.0 - 10.5 K/uL 5.4  RBC Latest Ref Range: 4.22 - 5.81  MIL/uL 3.73 (L)  Hemoglobin Latest Ref Range: 13.0 - 17.0 g/dL 09.611.4 (L)  HCT Latest Ref Range: 39.0 - 52.0 % 34.8 (L)  MCV Latest Ref Range: 80.0 - 100.0 fL 93.3  MCH Latest Ref Range: 26.0 - 34.0 pg 30.6  MCHC Latest Ref Range: 30.0 - 36.0 g/dL 04.532.8  RDW Latest Ref Range: 11.5 - 15.5 % 13.6  Platelets Latest Ref Range: 150 - 400 K/uL 246  nRBC Latest Ref Range: 0.0 - 0.2 % 0.0  Neutrophils Latest Units: % 55  Lymphocytes Latest Units: % 30  Monocytes Relative Latest Units: % 11  Eosinophil Latest Units: % 3  Basophil Latest Units: % 1  Immature Granulocytes Latest Units: % 0  NEUT# Latest Ref Range: 1.7 - 7.7 K/uL 3.0  Lymphocyte # Latest Ref Range: 0.7 - 4.0 K/uL 1.6  Monocyte # Latest Ref Range: 0.1 - 1.0 K/uL 0.6  Eosinophils Absolute Latest Ref Range: 0.0 - 0.5 K/uL 0.2  Basophils Absolute Latest Ref Range: 0.0 - 0.1 K/uL 0.0  Abs Immature Granulocytes Latest Ref Range: 0.00 - 0.07 K/uL 0.00  .   Scheduled Meds: . amLODipine  10 mg Oral q morning - 10a  . azelastine  1 spray Each Nare q morning - 10a  . docusate sodium  100 mg Oral BID  . enalapril  10 mg Oral BID  . enoxaparin (LOVENOX) injection  40 mg Subcutaneous Q24H  . fluticasone  2 spray Each Nare q morning - 10a  . ibuprofen  600 mg Oral QID  . mupirocin ointment  1 application Nasal BID  . pantoprazole  40 mg Oral q morning - 10a   Continuous Infusions: . 0.9 % NaCl with KCl 40 mEq / L 50 mL/hr (07/24/18 1036)  . famotidine (PEPCID) IV 20 mg (07/24/18 1039)    Principal Problem:   SBO (small bowel obstruction) (HCC) Active Problems:   Gastroesophageal reflux disease without esophagitis   Hypertension   Type 2 diabetes mellitus (HCC)   Hyperlipidemia   Intestinal adhesions with partial obstruction (HCC)   LOS: 7 days

## 2018-07-24 NOTE — Clinical Social Work Note (Signed)
In response to Social Work consult order, plese see CM note stating that patient will drive himself home.

## 2018-07-25 MED ORDER — BISACODYL 10 MG RE SUPP
10.0000 mg | Freq: Once | RECTAL | Status: AC
  Administered 2018-07-25: 10 mg via RECTAL
  Filled 2018-07-25: qty 1

## 2018-07-25 NOTE — Progress Notes (Signed)
[facility] Progress Note  4 Days Post-Op  Subjective: No major issues. Had a bowel movement but still feels bloated. Otherwise doing well.   Objective: Vital signs in last 24 hours: Temp:  [97.5 F (36.4 C)-98.7 F (37.1 C)] 98.7 F (37.1 C) (11/15 0548) Pulse Rate:  [66-70] 70 (11/15 0548) Resp:  [16] 16 (11/15 0548) BP: (143-160)/(90-93) 143/90 (11/15 0548) SpO2:  [96 %-100 %] 96 % (11/15 0548) Last BM Date: 07/24/18  Intake/Output from previous day: 11/14 0701 - 11/15 0700 In: 240 [P.O.:240] Out: -  Intake/Output this shift: No intake/output data recorded.  General appearance: alert, cooperative and no distress Resp: normal work breathing GI: soft, distended, dressing in place  Anti-infectives: Anti-infectives (From admission, onward)   Start     Dose/Rate Route Frequency Ordered Stop   07/21/18 0600  cefoTEtan (CEFOTAN) 2 g in sodium chloride 0.9 % 100 mL IVPB     2 g 200 mL/hr over 30 Minutes Intravenous On call to O.R. 07/20/18 1559 07/21/18 1307      Assessment/Plan: [name] 67 yo POD 4 s/p Ex lap, SBR, LOA for SBO. Overall improving. Reg diet today Suppository to help with stool in rectum he senses Likely home tomorrow, can move around and touch his toes/ bend, feels safe to slam on the brakes and change his gears, and has not been on any narcotic pain medication, will be able to drive home    LOS: 8 days    [name] Boyle 07/25/2018

## 2018-07-25 NOTE — Progress Notes (Signed)
PROGRESS NOTE  [name] Boyle VWU:[id number] DOB: [birth year] DOA: 07/17/2018 PCP: Pearson Grippe, MD  Brief Narrative: [name] Boyle is a 67 year old male with past medical history of type 2 diabetes mellitus GERD hyperlipidemia hypertension recurrent small bowel obstruction.  He is postop exploratory surgery 07/21/18.   Interval history/Subjective: Patient says he feels better and passing flatus.  No BM yet.  He has no further leaking from abdomen.   Assessment/Plan 1. Recurrent small bowel obstruction - POD#4 s/p exploratory lap.  Pt is progressing. Pt having flatus and had a small hard BM yesterday.  He feels bloated.  He is tolerating diet. Consulted care manager for home health needs and social worker in case he needs transportation home.    2. GERD - stable, controlled,  continue Protonix.   3. Hypertension - well controlled continue current medications.   4. Type 2 diabetes mellitus - stable.  DC CBGs.   5. Hyperlipidemia - resumed home atorvastatin every evening.    DVT prophylaxis: SCD Code Status: Full Family Communication: None at bedside Disposition Plan: Home in 1-2 days  Maryln Manuel, MD [facility] Pager [phone]    07/25/2018, 2:51 PM  LOS: 8 days   Consultants:  General surgery Henreitta Leber)   Procedures:  NG tube placement and removal  Exploratory laparotomy 07/21/18  Antimicrobials: Anti-infectives (From admission, onward)   Start     Dose/Rate Route Frequency Ordered Stop   07/21/18 0600  cefoTEtan (CEFOTAN) 2 g in sodium chloride 0.9 % 100 mL IVPB     2 g 200 mL/hr over 30 Minutes Intravenous On call to O.R. 07/20/18 1559 07/21/18 1307     Objective: Vitals:  Vitals:   07/24/18 2127 07/25/18 0548  BP: (!) 160/93 (!) 143/90  Pulse: 69 70  Resp: 16 16  Temp: 98.4 F (36.9 C) 98.7 F (37.1 C)  SpO2: 100% 96%   Exam:  Constitutional:  . Appears calm and comfortable, sitting up in a chair Eyes:  . pupils and irises appear normal . Normal lids  and conjunctivae ENMT:  . grossly normal hearing  . Lips appear normal . external ears, nose appear normal . Oropharynx: mucosa, tongue,posterior pharynx appear normal Neck:  . neck appears normal, no masses, normal ROM, supple . no thyromegaly Respiratory:  . CTA bilaterally, no w/r/r.  . Respiratory effort normal. No retractions or accessory muscle use Cardiovascular:  . RRR, no m/r/g . No LE extremity edema   . Normal pedal pulses Abdomen:  . Abdomen soft, bowel sounds heard, slightly bloated, bandages clean and dry, abd not distended, no drainage seen. No masses palpated.   . No HSM.   Musculoskeletal:  . Digits/nails BUE: no clubbing, cyanosis, petechiae, infection . exam of joints, bones, muscles of at least one of following: head/neck, RUE, LUE, RLE, LLE   o strength and tone normal, no atrophy, no abnormal movements o No tenderness, masses o Normal ROM, no contractures  Skin:  . No rashes, lesions, ulcers . palpation of skin: no induration or nodules Neurologic:  . CN 2-12 intact . Sensation all 4 extremities intact Psychiatric:  . Mental status o Mood, affect appropriate o Orientation to person, place, time  . judgment and insight appear intact    I have personally reviewed the following:   Data: Results for [name] Boyle, [name] Boyle (MRN [id number]) as of 07/21/2018 19:21  Ref. Range 07/21/2018 04:50  BASIC METABOLIC PANEL Unknown Rpt (A)  Sodium Latest Ref Range: 135 - 145 mmol/L 141  Potassium Latest Ref Range:  3.5 - 5.1 mmol/L 4.0  Chloride Latest Ref Range: 98 - 111 mmol/L 109  CO2 Latest Ref Range: 22 - 32 mmol/L 26  Glucose Latest Ref Range: 70 - 99 mg/dL 88  BUN Latest Ref Range: 8 - 23 mg/dL 6 (L)  Creatinine Latest Ref Range: 0.61 - 1.24 mg/dL 1.611.06  Calcium Latest Ref Range: 8.9 - 10.3 mg/dL 9.2  Anion gap Latest Ref Range: 5 - 15  6  GFR, Est Non African American Latest Ref Range: >60 mL/min >60  GFR, Est African American Latest Ref Range: >60 mL/min >60    WBC Latest Ref Range: 4.0 - 10.5 K/uL 5.4  RBC Latest Ref Range: 4.22 - 5.81 MIL/uL 3.73 (L)  Hemoglobin Latest Ref Range: 13.0 - 17.0 g/dL 09.611.4 (L)  HCT Latest Ref Range: 39.0 - 52.0 % 34.8 (L)  MCV Latest Ref Range: 80.0 - 100.0 fL 93.3  MCH Latest Ref Range: 26.0 - 34.0 pg 30.6  MCHC Latest Ref Range: 30.0 - 36.0 g/dL 04.532.8  RDW Latest Ref Range: 11.5 - 15.5 % 13.6  Platelets Latest Ref Range: 150 - 400 K/uL 246  nRBC Latest Ref Range: 0.0 - 0.2 % 0.0  Neutrophils Latest Units: % 55  Lymphocytes Latest Units: % 30  Monocytes Relative Latest Units: % 11  Eosinophil Latest Units: % 3  Basophil Latest Units: % 1  Immature Granulocytes Latest Units: % 0  NEUT# Latest Ref Range: 1.7 - 7.7 K/uL 3.0  Lymphocyte # Latest Ref Range: 0.7 - 4.0 K/uL 1.6  Monocyte # Latest Ref Range: 0.1 - 1.0 K/uL 0.6  Eosinophils Absolute Latest Ref Range: 0.0 - 0.5 K/uL 0.2  Basophils Absolute Latest Ref Range: 0.0 - 0.1 K/uL 0.0  Abs Immature Granulocytes Latest Ref Range: 0.00 - 0.07 K/uL 0.00  .   Scheduled Meds: . amLODipine  10 mg Oral q morning - 10a  . atorvastatin  40 mg Oral QPM  . azelastine  1 spray Each Nare q morning - 10a  . docusate sodium  100 mg Oral BID  . enalapril  10 mg Oral BID  . enoxaparin (LOVENOX) injection  40 mg Subcutaneous Q24H  . famotidine  20 mg Oral BID  . fluticasone  2 spray Each Nare q morning - 10a  . ibuprofen  600 mg Oral QID  . pantoprazole  40 mg Oral q morning - 10a   Continuous Infusions:  Principal Problem:   SBO (small bowel obstruction) (HCC) Active Problems:   Gastroesophageal reflux disease without esophagitis   Hypertension   Type 2 diabetes mellitus (HCC)   Hyperlipidemia   Intestinal adhesions with partial obstruction (HCC)   LOS: 8 days

## 2018-07-26 DIAGNOSIS — K5651 Intestinal adhesions [bands], with partial obstruction: Principal | ICD-10-CM

## 2018-07-26 MED ORDER — DOCUSATE SODIUM 100 MG PO CAPS: 100.0000 mg | ORAL_CAPSULE | Freq: Two times a day (BID) | ORAL | 0 refills | Status: DC

## 2018-07-26 MED ORDER — IBUPROFEN 600 MG PO TABS: 600.0000 mg | ORAL_TABLET | Freq: Three times a day (TID) | ORAL | 0 refills | Status: DC | PRN

## 2018-07-26 NOTE — Plan of Care (Signed)
  Problem: Education: Goal: Knowledge of General Education information will improve Description Including pain rating scale, medication(s)/side effects and non-pharmacologic comfort measures 07/26/2018 1212 by Darreld Mcleanox, Tyee Vandevoorde, RN Outcome: Adequate for Discharge 07/26/2018 0805 by Darreld Mcleanox, Tharon Kitch, RN Outcome: Progressing   Problem: Health Behavior/Discharge Planning: Goal: Ability to manage health-related needs will improve 07/26/2018 1212 by Darreld Mcleanox, Rock Sobol, RN Outcome: Adequate for Discharge 07/26/2018 0805 by Darreld Mcleanox, Desten Manor, RN Outcome: Progressing   Problem: Clinical Measurements: Goal: Ability to maintain clinical measurements within normal limits will improve 07/26/2018 1212 by Darreld Mcleanox, Troi Florendo, RN Outcome: Adequate for Discharge 07/26/2018 0805 by Darreld Mcleanox, Danyetta Gillham, RN Outcome: Progressing Goal: Will remain free from infection 07/26/2018 1212 by Darreld Mcleanox, Verdell Kincannon, RN Outcome: Adequate for Discharge 07/26/2018 0805 by Darreld Mcleanox, Nataliah Hatlestad, RN Outcome: Progressing Goal: Diagnostic test results will improve 07/26/2018 1212 by Darreld Mcleanox, Reighn Kaplan, RN Outcome: Adequate for Discharge 07/26/2018 0805 by Darreld Mcleanox, Jelina Paulsen, RN Outcome: Progressing Goal: Respiratory complications will improve 07/26/2018 1212 by Darreld Mcleanox, Deanndra Kirley, RN Outcome: Adequate for Discharge 07/26/2018 0805 by Darreld Mcleanox, Zainab Crumrine, RN Outcome: Progressing Goal: Cardiovascular complication will be avoided 07/26/2018 1212 by Darreld Mcleanox, [place] Nonaka, RN Outcome: Adequate for Discharge 07/26/2018 0805 by Darreld Mcleanox, Phoenyx Paulsen, RN Outcome: Progressing   Problem: Activity: Goal: Risk for activity intolerance will decrease 07/26/2018 1212 by Darreld Mcleanox, Stormie Ventola, RN Outcome: Adequate for Discharge 07/26/2018 0805 by Darreld Mcleanox, Chanay Nugent, RN Outcome: Progressing   Problem: Nutrition: Goal: Adequate nutrition will be maintained 07/26/2018 1212 by Darreld Mcleanox, Nazaiah Navarrete, RN Outcome: Adequate for Discharge 07/26/2018 0805 by Darreld Mcleanox, Eloise Picone, RN Outcome: Progressing   Problem: Coping: Goal: Level of anxiety will decrease 07/26/2018 1212 by Darreld Mcleanox, Jacarra Bobak,  RN Outcome: Adequate for Discharge 07/26/2018 0805 by Darreld Mcleanox, Leetta Hendriks, RN Outcome: Progressing   Problem: Elimination: Goal: Will not experience complications related to bowel motility 07/26/2018 1212 by Darreld Mcleanox, Gerica Koble, RN Outcome: Adequate for Discharge 07/26/2018 0805 by Darreld Mcleanox, Zori Benbrook, RN Outcome: Progressing Goal: Will not experience complications related to urinary retention 07/26/2018 1212 by Darreld Mcleanox, Khaleef Ruby, RN Outcome: Adequate for Discharge 07/26/2018 0805 by Darreld Mcleanox, Chyrel Taha, RN Outcome: Progressing   Problem: Pain Managment: Goal: General experience of comfort will improve 07/26/2018 1212 by Darreld Mcleanox, Gianina Olinde, RN Outcome: Adequate for Discharge 07/26/2018 0805 by Darreld Mcleanox, Bryn Saline, RN Outcome: Progressing   Problem: Safety: Goal: Ability to remain free from injury will improve 07/26/2018 1212 by Darreld Mcleanox, Dashayla Theissen, RN Outcome: Adequate for Discharge 07/26/2018 0805 by Darreld Mcleanox, Bennett Ram, RN Outcome: Progressing   Problem: Skin Integrity: Goal: Risk for impaired skin integrity will decrease 07/26/2018 1212 by Darreld Mcleanox, Karter Haire, RN Outcome: Adequate for Discharge 07/26/2018 0805 by Darreld Mcleanox, Dakayla Disanti, RN Outcome: Progressing

## 2018-07-26 NOTE — Discharge Summary (Signed)
Physician Discharge Summary  [name] Boyle ZOX:[id number] DOB: Mar 15, 1951 DOA: 07/17/2018  PCP: Pearson Grippe, MD  Admit date: 07/17/2018 Discharge date: 07/26/2018  Admitted From: Home  Disposition: Home   Recommendations for Outpatient Follow-up:  1. Follow up with PCP in 2 weeks for recheck 2. Follow up with Dr. Henreitta Leber as scheduled for recheck.   Home Health: Declined  Discharge Condition: STABLE   CODE STATUS: FULL    Brief Hospitalization Summary: Please see all hospital notes, images, labs for full details of the hospitalization. HPI: [name] Boyle is a 67 y.o. male with medical history significant of type 2 diabetes, GERD, hyperlipidemia, hypertension, recent episode of SBO in late September and last month was admitted from 07/08/2018 to 07/12/2018 for SBO as well.  He returns today to the hospital after having several episodes of loose/soft stools yesterday, but no symptoms until this afternoon.  He says that he woke up this morning ate breakfast and lunch without any issues.  However, around 1600 he started having abdominal pain and abdominal distention.  This was followed by nausea and an episode of emesis at around 1900.  He denies constipation, melena or hematochezia.  He denies dysuria, frequency or hematuria.  He denies fever, chills, sore throat, wheezing, hemoptysis, chest pain, palpitations, dyspnea, diaphoresis, PND, orthopnea or pitting edema of the lower extremities.  No heat or cold intolerance.  No polyuria, polydipsia, polyphagia or blurred vision.  Denies skin pruritus.  ED Course: Initial vital signs temperature 98.2 F, pulse 91, respiration 19, blood pressure 146/90 mmHg and O2 sat 98% on room air.  He received morphine 4 mg IVP plus ondansetron 4 mg IVP along with a 1 L NS bolus with partial relief.  However he had to be given 1 mg of hydromorphone an hour plus after the morphine was administered due to recurrent pain.  Dr. Rise Mu discussed the case with Dr. Henreitta Leber who  recommended NG tube placement and admission to the hospitalist service.  His urinalysis showed an amber color, with a hazy appearance and 30 mg/dL of protein.  All other values were normal.  White count is 11.4, hemoglobin 13.5 g/dL and platelets 811.  Lipase was 67 units/L.  CMP showed a glucose of 131 mg/dL and total protein 8.2 g/dL, but all other values were within normal limits.  Phosphorus 4.2 and magnesium was 1.9 mg/dL.  Imaging: Acute abdomen with chest radiographs showed slight interval worsening of small bowel obstruction.  There was no free air.  There was possible appearance of intramuscular air in the right lower quadrant.  CT was recommended for further evaluation.    Brief Narrative: [name] Boyle is a 67 year old male with past medical history of type 2 diabetes mellitus GERD hyperlipidemia hypertension recurrent small bowel obstruction.  He is postop exploratory surgery 07/21/18.   Interval history/Subjective: Patient says he feels better and passing flatus.  No BM yet.  He has no further leaking from abdomen.   Assessment/Plan 1. Recurrent small bowel obstruction - POD#5 s/p exploratory lap.  Pt is progressing well.  He feels much better today and wants to go home. Pt having flatus and had multiple bowel movements in last couple of days. He is tolerating diet. Consulted care manager for home health needs and social worker in case he needs transportation home.   Ibuprofen controls his pain as needed.  I spoke with Dr. Henreitta Leber and patient can discharge home today. He has a follow up with surgery scheduled for suture removal.   2. GERD -  stable, controlled,  continue Protonix.   3. Hypertension - well controlled continue current medications.   4. Type 2 diabetes mellitus - stable.  Resume home treatment plan.    5. Hyperlipidemia - resumed home atorvastatin.    DVT prophylaxis: SCD Code Status: Full Family Communication: None at bedside, patient updated and questions  answered Disposition Plan: [name], MD [facility] Pager [phone]    07/25/2018, 2:51 PM  LOS: 8 days   Consultants:  General surgery Henreitta Leber)   Procedures:  NG tube placement and removal  Exploratory laparotomy 07/21/18  Discharge Diagnoses:  Principal Problem:   SBO (small bowel obstruction) (HCC) Active Problems:   Gastroesophageal reflux disease without esophagitis   Hypertension   Type 2 diabetes mellitus (HCC)   Hyperlipidemia   Intestinal adhesions with partial obstruction [facility])  Discharge Instructions: Discharge Instructions    Call MD for:  difficulty breathing, headache or visual disturbances   Complete by:  As directed    Call MD for:  extreme fatigue   Complete by:  As directed    Call MD for:  persistant dizziness or light-headedness   Complete by:  As directed    Call MD for:  severe uncontrolled pain   Complete by:  As directed    Increase activity slowly   Complete by:  As directed      Allergies as of 07/26/2018   No Known Allergies     Medication List    STOP taking these medications   amoxicillin 500 MG capsule Commonly known as:  AMOXIL     TAKE these medications   amLODipine 10 MG tablet Commonly known as:  NORVASC Take 10 mg by mouth every morning.   aspirin 81 MG tablet Take 81 mg by mouth every morning.   atorvastatin 40 MG tablet Commonly known as:  LIPITOR Take 40 mg by mouth every evening.   azelastine 0.1 % nasal spray Commonly known as:  ASTELIN Place 1 spray into both nostrils every morning. Use in each nostril as directed-May use in the evening as needed   B-COMPLEX-C PO Take 1 capsule by mouth every morning.   co-enzyme Q-10 30 MG capsule Take 300 mg by mouth every morning.   docusate sodium 100 MG capsule Commonly known as:  COLACE Take 1 capsule (100 mg total) by mouth 2 (two) times daily.   enalapril 10 MG tablet Commonly known as:  VASOTEC Take 10 mg by mouth 2 (two) times  daily.   FISH OIL PO Take 1,400 mg by mouth every morning.   fluticasone 50 MCG/ACT nasal spray Commonly known as:  FLONASE Place 2 sprays into both nostrils every morning.   GLUCOS-CHONDROIT-MSM-C-HYAL PO Take 1 tablet by mouth every morning.   HM FEXOFENADINE HCL 180 MG tablet Generic drug:  fexofenadine Take 180 mg by mouth every morning.   ibuprofen 600 MG tablet Commonly known as:  ADVIL,MOTRIN Take 1 tablet (600 mg total) by mouth every 8 (eight) hours as needed for fever, headache, moderate pain or cramping.   metFORMIN 500 MG tablet Commonly known as:  GLUCOPHAGE Take 500 mg by mouth every evening.   multivitamin capsule Take 1 capsule by mouth every morning.   pantoprazole 40 MG tablet Commonly known as:  PROTONIX Take 1 tablet (40 mg total) by mouth daily. What changed:  when to take this   ranitidine 150 MG tablet Commonly known as:  ZANTAC Take 150 mg by mouth daily as needed for heartburn.  sennosides-docusate sodium 8.6-50 MG tablet Commonly known as:  SENOKOT-S Take 2 tablets by mouth at bedtime.   STELARA 45 MG/0.5ML Soln Generic drug:  Ustekinumab Inject 0.5 mLs into the skin once a week. Every 12 weeks. Due in January   vitamin C 1000 MG tablet Take 1,000 mg by mouth every morning.   Vitamin D3 125 MCG (5000 UT) Caps Take 1 capsule by mouth every morning.      Follow-up Information    Pearson Grippe, MD. Schedule an appointment as soon as possible for a visit in 2 week(s).   Specialty:  Internal Medicine Contact information: [address] [place] [address] [phone]        [name], MD Follow up on 08/05/2018.   Specialty:  General Surgery Contact information: [address] [place] [facility] [address] [phone]          No Known Allergies Allergies as of 07/26/2018   No Known Allergies     Medication List    STOP taking these medications   amoxicillin 500 MG capsule Commonly known as:   AMOXIL     TAKE these medications   amLODipine 10 MG tablet Commonly known as:  NORVASC Take 10 mg by mouth every morning.   aspirin 81 MG tablet Take 81 mg by mouth every morning.   atorvastatin 40 MG tablet Commonly known as:  LIPITOR Take 40 mg by mouth every evening.   azelastine 0.1 % nasal spray Commonly known as:  ASTELIN Place 1 spray into both nostrils every morning. Use in each nostril as directed-May use in the evening as needed   B-COMPLEX-C PO Take 1 capsule by mouth every morning.   co-enzyme Q-10 30 MG capsule Take 300 mg by mouth every morning.   docusate sodium 100 MG capsule Commonly known as:  COLACE Take 1 capsule (100 mg total) by mouth 2 (two) times daily.   enalapril 10 MG tablet Commonly known as:  VASOTEC Take 10 mg by mouth 2 (two) times daily.   FISH OIL PO Take 1,400 mg by mouth every morning.   fluticasone 50 MCG/ACT nasal spray Commonly known as:  FLONASE Place 2 sprays into both nostrils every morning.   GLUCOS-CHONDROIT-MSM-C-HYAL PO Take 1 tablet by mouth every morning.   HM FEXOFENADINE HCL 180 MG tablet Generic drug:  fexofenadine Take 180 mg by mouth every morning.   ibuprofen 600 MG tablet Commonly known as:  ADVIL,MOTRIN Take 1 tablet (600 mg total) by mouth every 8 (eight) hours as needed for fever, headache, moderate pain or cramping.   metFORMIN 500 MG tablet Commonly known as:  GLUCOPHAGE Take 500 mg by mouth every evening.   multivitamin capsule Take 1 capsule by mouth every morning.   pantoprazole 40 MG tablet Commonly known as:  PROTONIX Take 1 tablet (40 mg total) by mouth daily. What changed:  when to take this   ranitidine 150 MG tablet Commonly known as:  ZANTAC Take 150 mg by mouth daily as needed for heartburn.   sennosides-docusate sodium 8.6-50 MG tablet Commonly known as:  SENOKOT-S Take 2 tablets by mouth at bedtime.   STELARA 45 MG/0.5ML Soln Generic drug:  Ustekinumab Inject 0.5 mLs into  the skin once a week. Every 12 weeks. Due in January   vitamin C 1000 MG tablet Take 1,000 mg by mouth every morning.   Vitamin D3 125 MCG (5000 UT) Caps Take 1 capsule by mouth every morning.  Procedures/Studies: Ct Abdomen Pelvis W Contrast  Result Date: 07/18/2018 CLINICAL DATA:  Bowel obstruction.  Abdominal pain. EXAM: CT ABDOMEN AND PELVIS WITH CONTRAST TECHNIQUE: Multidetector CT imaging of the abdomen and pelvis was performed using the standard protocol following bolus administration of intravenous contrast. CONTRAST:  ISOVUE-300 IOPAMIDOL (ISOVUE-300) INJECTION 61% COMPARISON:  Radiographs dated 07/17/2018 and CT scans dated 07/08/2018 and 06/09/2018 FINDINGS: Lower chest: There is minimal atelectasis at the lung bases. Heart size is normal. Hepatobiliary: 12 mm cyst in the left lobe of the liver adjacent to the falciform ligament. Liver parenchyma is otherwise normal. Biliary tree is normal. Pancreas: Unremarkable. No pancreatic ductal dilatation or surrounding inflammatory changes. Spleen: Normal in size without focal abnormality. Adrenals/Urinary Tract: Stable bilateral renal cysts. No hydronephrosis. Adrenal glands and bladder are normal. Stomach/Bowel: There is decreased distention of mid small bowel loops. NG tube in the upper body of the stomach. Distal small bowel and colon appear normal. Appendix is normal. Vascular/Lymphatic: Aortic atherosclerosis. No enlarged abdominal or pelvic lymph nodes. Reproductive: Prostate is unremarkable. Other: No free air or free fluid.  No abdominal wall hernia. Musculoskeletal: No acute abnormality. Facet arthritis in the lower lumbar spine. IMPRESSION: 1. Decreased distention of small bowel loops. Oral contrast has passed through the distended loops into nondistended distal small bowel. Findings are consistent with resolving partial small bowel obstruction. 2.  Aortic Atherosclerosis (ICD10-I70.0). 3. Minimal atelectasis at the lung bases.  Chronic elevation of the left hemidiaphragm. Electronically Signed   By: Francene Boyers M.D.   On: 07/18/2018 14:05   Ct Abdomen Pelvis W Contrast  Result Date: 07/08/2018 CLINICAL DATA:  Abdominal pain with episode of vomiting today. EXAM: CT ABDOMEN AND PELVIS WITH CONTRAST TECHNIQUE: Multidetector CT imaging of the abdomen and pelvis was performed using the standard protocol following bolus administration of intravenous contrast. CONTRAST:  ISOVUE-300 IOPAMIDOL (ISOVUE-300) INJECTION 61% COMPARISON:  06/09/2018 FINDINGS: Lower chest: Lung bases demonstrate mild bibasilar atelectatic change. Calcified plaque over the right coronary artery. Mild cardiomegaly. Hepatobiliary: Stable 1 cm cyst over the left lobe of the liver. Remainder of the liver, gallbladder and biliary tree are normal. Pancreas: Normal. Spleen: Normal. Adrenals/Urinary Tract: Adrenal glands are normal. Kidneys are normal in size without hydronephrosis or nephrolithiasis. Several bilateral renal cysts are present. Indeterminate 1.7 cm hypodensity over the mid pole right kidney unchanged and likely a hyperdense cyst. Ureters and bladder are normal. Stomach/Bowel: Stomach is within normal. There are multiple air and fluid-filled dilated small bowel loops measuring up to 4.9 cm in diameter. Transition point may be over the anterior mid abdomen possibly due to adhesions. Appendix is normal. Colon is somewhat decompressed. Vascular/Lymphatic: Moderate calcified plaque over the abdominal aorta. No adenopathy. Reproductive: Normal. Other: Mild patchy free fluid in the abdomen and pelvis. No free peritoneal air. Musculoskeletal: Degenerative change of the spine and hips. IMPRESSION: Evidence of mid to distal small bowel obstruction with transition point likely within the anterior mid abdomen due to adhesions. Mild patchy ascites. Bilateral renal cysts. Indeterminate 1.7 cm right renal cortical hypodensity unchanged and likely a hyperdense cyst.  Consider follow-up MRI with and without contrast in 6 months. Stable 1 cm liver cyst. Aortic Atherosclerosis (ICD10-I70.0). Mild cardiomegaly.  Minimal atherosclerotic coronary artery disease. Electronically Signed   By: Elberta Fortis M.D.   On: 07/08/2018 02:03   Dg Chest Port 1 View  Result Date: 07/18/2018 CLINICAL DATA:  NG tube placement EXAM: PORTABLE CHEST 1 VIEW COMPARISON:  07/17/2018 FINDINGS: Elevation of the left  diaphragm. Poorly visible esophageal tube tip in the left upper quadrant. Lung fields are clear. Heart size is stable. Aortic atherosclerosis. IMPRESSION: Difficult visualization of esophageal tube, tip appears to be in the left upper quadrant. Electronically Signed   By: Jasmine Pang M.D.   On: 07/18/2018 00:08   Dg Chest Portable 1 View  Result Date: 07/08/2018 CLINICAL DATA:  67 y/o  M; in G-tube placement. EXAM: PORTABLE CHEST 1 VIEW COMPARISON:  06/09/2018 chest radiograph FINDINGS: Stable normal cardiac silhouette given projection and technique. Calcific aortic atherosclerosis. Enteric tube is coiling within the proximal stomach. Elevated left hemidiaphragm. Left basilar atelectasis. No new consolidation, effusion, or pneumothorax. Bones are unremarkable. IMPRESSION: Enteric tube is coiling within the proximal stomach. Elevated left hemidiaphragm. Left basilar atelectasis. Electronically Signed   By: Mitzi Hansen M.D.   On: 07/08/2018 03:08   Dg Abd 2 Views  Result Date: 07/10/2018 CLINICAL DATA:  Constipation for 2 days. EXAM: ABDOMEN - 2 VIEW COMPARISON:  CT 07/08/2018 FINDINGS: Small bowel dilation has improved compared to the previous CT. Small-bowel remains mildly prominent and there are multiple air-fluid levels on the erect view. No free air. No acute skeletal or soft tissue abnormality. Minor atelectasis noted at the left lung base. IMPRESSION: 1. Improved partial small bowel obstruction 2. No free air. Electronically Signed   By: Amie Portland M.D.   On:  07/10/2018 09:15   Dg Abd Acute W/chest  Result Date: 07/17/2018 CLINICAL DATA:  Bowel obstruction EXAM: DG ABDOMEN ACUTE W/ 1V CHEST COMPARISON:  07/10/2018, CT 07/08/2018 FINDINGS: Single-view chest demonstrates elevated left diaphragm with atelectasis at the left base. No pleural effusion. Stable cardiomediastinal silhouette with aortic atherosclerosis. Upright view abdomen shows no free air beneath the diaphragm. Interval worsening of small bowel dilatation, measuring up to 7.1 cm in length with increased fluid levels on upright exam consistent with ongoing bowel obstruction. Questionable appearance of intramural air in the right lower quadrant. IMPRESSION: 1. Slight interval worsening of small bowel obstruction. No free air. 2. Possible appearance of intramural air in the right lower quadrant. CT is recommended for further evaluation. Electronically Signed   By: Jasmine Pang M.D.   On: 07/17/2018 21:25      Subjective: Pt says that he is feeling much better.  He wants to go home.  He has had multiple bowel movements since yesterday.    Discharge Exam: Vitals:   07/25/18 2215 07/26/18 0616  BP: (!) 141/80 121/75  Pulse: 75 65  Resp: 16 16  Temp: 98.7 F (37.1 C) 98.4 F (36.9 C)  SpO2: 96% 98%   Vitals:   07/25/18 1453 07/25/18 2054 07/25/18 2215 07/26/18 0616  BP: 121/85 (!) 143/81 (!) 141/80 121/75  Pulse: 81  75 65  Resp: 18 20 16 16   Temp: 98.3 F (36.8 C) 98.3 F (36.8 C) 98.7 F (37.1 C) 98.4 F (36.9 C)  TempSrc: Oral Oral Oral Oral  SpO2: 98% 99% 96% 98%  Weight:      Height:       Constitutional:   Appears calm and comfortable, sitting up in a chair Eyes:   pupils and irises appear normal  Normal lids and conjunctivae ENMT:   grossly normal hearing   Lips appear normal  external ears, nose appear normal  Oropharynx: mucosa, tongue,posterior pharynx appear normal Neck:   neck appears normal, no masses, normal ROM, supple  no  thyromegaly Respiratory:   CTA bilaterally, no w/r/r.   Respiratory effort normal. No retractions  or accessory muscle use Cardiovascular:   RRR, no m/r/g  No LE extremity edema    Normal pedal pulses Abdomen:   Abdomen soft, bowel sounds heard, slightly bloated, bandages clean and dry, abd not distended, no drainage seen. No masses palpated.    No HSM.   Musculoskeletal:   Digits/nails BUE: no clubbing, cyanosis, petechiae, infection  exam of joints, bones, muscles of at least one of following: head/neck, RUE, LUE, RLE, LLE   ? strength and tone normal, no atrophy, no abnormal movements ? No tenderness, masses ? Normal ROM, no contractures  Skin:   No rashes, lesions, ulcers  palpation of skin: no induration or nodules Neurologic:   CN 2-12 intact  Sensation all 4 extremities intact Psychiatric:   Mental status ? Mood, affect appropriate ? Orientation to person, place, time   judgment and insight appear intact      The results of significant diagnostics from this hospitalization (including imaging, microbiology, ancillary and laboratory) are listed below for reference.     Microbiology: Recent Results (from the past 240 hour(s))  Surgical pcr screen     Status: Abnormal   Collection Time: 07/20/18 12:38 PM  Result Value Ref Range Status   MRSA, PCR NEGATIVE NEGATIVE Final   Staphylococcus aureus POSITIVE (A) NEGATIVE Final    Comment: (NOTE) The Xpert SA Assay (FDA approved for NASAL specimens in patients 69 years of age and older), is one component of a comprehensive surveillance program. It is not intended to diagnose infection nor to guide or monitor treatment. Performed at [facility], [address] [place], [place] [id number]      Labs: BNP (last 3 results) No results for input(s): BNP in the last 8760 hours. Basic Metabolic Panel: Recent Labs  Lab 07/21/18 0450  NA 141  K 4.0  CL 109  CO2 26  GLUCOSE 88  BUN 6*  CREATININE 1.06   CALCIUM 9.2   Liver Function Tests: No results for input(s): AST, ALT, ALKPHOS, BILITOT, PROT, ALBUMIN in the last 168 hours. No results for input(s): LIPASE, AMYLASE in the last 168 hours. No results for input(s): AMMONIA in the last 168 hours. CBC: Recent Labs  Lab 07/20/18 0630 07/21/18 0450 07/22/18 0511  WBC 5.4 5.4 9.6  NEUTROABS 3.4 3.0 7.9*  HGB 11.9* 11.4* 11.9*  HCT 35.9* 34.8* 36.2*  MCV 93.7 93.3 96.0  PLT 236 246 241   Cardiac Enzymes: No results for input(s): CKTOTAL, CKMB, CKMBINDEX, TROPONINI in the last 168 hours. BNP: Invalid input(s): POCBNP CBG: Recent Labs  Lab 07/23/18 0758 07/23/18 1109 07/23/18 1655 07/24/18 0739 07/24/18 1124  GLUCAP 97 88 111* 80 126*   D-Dimer No results for input(s): DDIMER in the last 72 hours. Hgb A1c No results for input(s): HGBA1C in the last 72 hours. Lipid Profile No results for input(s): CHOL, HDL, LDLCALC, TRIG, CHOLHDL, LDLDIRECT in the last 72 hours. Thyroid function studies No results for input(s): TSH, T4TOTAL, T3FREE, THYROIDAB in the last 72 hours.  Invalid input(s): FREET3 Anemia work up No results for input(s): VITAMINB12, FOLATE, FERRITIN, TIBC, IRON, RETICCTPCT in the last 72 hours. Urinalysis    Component Value Date/Time   COLORURINE AMBER (A) 07/17/2018 2020   APPEARANCEUR HAZY (A) 07/17/2018 2020   LABSPEC 1.017 07/17/2018 2020   PHURINE 5.0 07/17/2018 2020   GLUCOSEU NEGATIVE 07/17/2018 2020   HGBUR NEGATIVE 07/17/2018 2020   BILIRUBINUR NEGATIVE 07/17/2018 2020   KETONESUR NEGATIVE 07/17/2018 2020   PROTEINUR 30 (A) 07/17/2018 2020  NITRITE NEGATIVE 07/17/2018 2020   LEUKOCYTESUR NEGATIVE 07/17/2018 2020   Sepsis Labs Invalid input(s): PROCALCITONIN,  WBC,  LACTICIDVEN Microbiology Recent Results (from the past 240 hour(s))  Surgical pcr screen     Status: Abnormal   Collection Time: 07/20/18 12:38 PM  Result Value Ref Range Status   MRSA, PCR NEGATIVE NEGATIVE Final    Staphylococcus aureus POSITIVE (A) NEGATIVE Final    Comment: (NOTE) The Xpert SA Assay (FDA approved for NASAL specimens in patients 57 years of age and older), is one component of a comprehensive surveillance program. It is not intended to diagnose infection nor to guide or monitor treatment. Performed at [facility], [address] [place], [place] [address]    Time coordinating discharge: 35 minutes   SIGNED:  Standley Dakins, MD  [facility] 07/26/2018, 12:02 PM Pager [phone]  If 7PM-7AM, please contact night-coverage [email or web address] Password [email or web address]

## 2018-07-26 NOTE — Plan of Care (Signed)

## 2018-07-26 NOTE — Discharge Instructions (Signed)
Discharge Instructions: Shower per your regular routine. Take tylenol and ibuprofen as needed for pain control, alternating every 4-6 hours.  Take colace for constipation. No heavy lifting > 10 lbs, no excessive squatting, bending, pushing, pulling for the next 6-8 weeks.     Exploratory Laparotomy, Adult, Care After Refer to this sheet in the next few weeks. These instructions provide you with information about caring for yourself after your procedure. Your health care provider may also give you more specific instructions. Your treatment has been planned according to current medical practices, but problems sometimes occur. Call your health care provider if you have any problems or questions after your procedure. What can I expect after the procedure? After your procedure, it is typical to have:  Abdominal soreness.  Fatigue.  A sore throat from tubes in your throat.  A lack of appetite.  Follow these instructions at home: Medicines  Take medicines only as directed by your health care provider.  Do not drive or operate heavy machinery while taking pain medicine. Incision care  There are many different ways to close and cover an incision, including stitches (sutures), skin glue, and adhesive strips. Follow your health care provider's instructions about: ? Incision care. ? Bandage (dressing) changes and removal. ? Incision closure removal.  Do not take showers or baths until your health care provider says that you can.  Check your incision area daily for signs of infection. Watch for: ? Redness. ? Tenderness. ? Swelling. ? Drainage. Activity  Do not lift anything that is heavier than 10 pounds (4.5 kg) until your health care provider says that it is safe.  Try to walk a little bit each day if your health care provider says that it is okay.  Ask your health care provider when you can start to do your usual activities again, such as driving, going back to work, and having  sex. Eating and drinking  You may eat what you usually eat. Include lots of whole grains, fruits, and vegetables in your diet. This will help to prevent constipation.  Drink enough fluid to keep your urine clear or pale yellow. General instructions  Keep all follow-up visits as directed by your health care provider. This is important. Contact a health care provider if:  You have a fever.  You have chills.  Your pain medicine is not helping.  You have constipation or diarrhea.  You have nausea or vomiting.  You have drainage, redness, swelling, or pain at your incision site. Get help right away if:  Your pain is getting worse.  It has been more than 3 days since you been able to have a bowel movement.  You have ongoing (persistent) vomiting.  The edges of your incision open up.  You have warmth, tenderness, and swelling in your calf.  You have trouble breathing.  You have chest pain. This information is not intended to replace advice given to you by your health care provider. Make sure you discuss any questions you have with your health care provider. Document Released: 04/10/2004 Document Revised: 02/02/2016 Document Reviewed: 04/14/2014 Elsevier Interactive Patient Education  2018 [employer] Inc.  Small Bowel Obstruction A small bowel obstruction means that something is blocking the small bowel. The small bowel is also called the small intestine. It is the long tube that connects the stomach to the colon. An obstruction will stop food and fluids from passing through the small bowel. Treatment depends on what is causing the problem and how bad the problem is.  Follow these instructions at home:  Get a lot of rest.  Follow your diet as told by your doctor. You may need to: ? Only drink clear liquids until you start to get better. ? Avoid solid foods as told by your doctor.  Take over-the-counter and prescription medicines only as told by your doctor.  Keep all  follow-up visits as told by your doctor. This is important. Contact a doctor if:  You have a fever.  You have chills. Get help right away if:  You have pain or cramps that get worse.  You throw up (vomit) blood.  You have a feeling of being sick to your stomach (nausea) that does not go away.  You cannot stop throwing up.  You cannot drink fluids.  You feel confused.  You feel dry or thirsty (dehydrated).  Your belly gets more bloated.  You feel weak or you pass out (faint). This information is not intended to replace advice given to you by your health care provider. Make sure you discuss any questions you have with your health care provider. Document Released: 10/04/2004 Document Revised: 04/23/2016 Document Reviewed: 10/21/2014 Elsevier Interactive Patient Education  2018 [employer].   Exploratory Laparotomy, Adult, Care After Refer to this sheet in the next few weeks. These instructions provide you with information about caring for yourself after your procedure. Your health care provider may also give you more specific instructions. Your treatment has been planned according to current medical practices, but problems sometimes occur. Call your health care provider if you have any problems or questions after your procedure. What can I expect after the procedure? After your procedure, it is typical to have:  Abdominal soreness.  Fatigue.  A sore throat from tubes in your throat.  A lack of appetite.  Follow these instructions at home: Medicines  Take medicines only as directed by your health care provider.  Do not drive or operate heavy machinery while taking pain medicine. Incision care  There are many different ways to close and cover an incision, including stitches (sutures), skin glue, and adhesive strips. Follow your health care provider's instructions about: ? Incision care. ? Bandage (dressing) changes and removal. ? Incision closure removal.  Do not  take showers or baths until your health care provider says that you can.  Check your incision area daily for signs of infection. Watch for: ? Redness. ? Tenderness. ? Swelling. ? Drainage. Activity  Do not lift anything that is heavier than 10 pounds (4.5 kg) until your health care provider says that it is safe.  Try to walk a little bit each day if your health care provider says that it is okay.  Ask your health care provider when you can start to do your usual activities again, such as driving, going back to work, and having sex. Eating and drinking  You may eat what you usually eat. Include lots of whole grains, fruits, and vegetables in your diet. This will help to prevent constipation.  Drink enough fluid to keep your urine clear or pale yellow. General instructions  Keep all follow-up visits as directed by your health care provider. This is important. Contact a health care provider if:  You have a fever.  You have chills.  Your pain medicine is not helping.  You have constipation or diarrhea.  You have nausea or vomiting.  You have drainage, redness, swelling, or pain at your incision site. Get help right away if:  Your pain is getting worse.  It  has been more than 3 days since you been able to have a bowel movement.  You have ongoing (persistent) vomiting.  The edges of your incision open up.  You have warmth, tenderness, and swelling in your calf.  You have trouble breathing.  You have chest pain. This information is not intended to replace advice given to you by your health care provider. Make sure you discuss any questions you have with your health care provider. Document Released: 04/10/2004 Document Revised: 02/02/2016 Document Reviewed: 04/14/2014 Elsevier Interactive Patient Education  2018 [employer].   Soft-Food Meal Plan A soft-food meal plan includes foods that are safe and easy to swallow. This meal plan typically is used:  If you are  having trouble chewing or swallowing foods.  As a transition meal plan after only having had liquid meals for a long period.  What do I need to know about the soft-food meal plan? A soft-food meal plan includes tender foods that are soft and easy to chew and swallow. In most cases, bite-sized pieces of food are easier to swallow. A bite-sized piece is about  inch or smaller. Foods in this plan do not need to be ground or pureed. Foods that are very hard, crunchy, or sticky should be avoided. Also, breads, cereals, yogurts, and desserts with nuts, seeds, or fruits should be avoided. What foods can I eat? Grains Rice and wild rice. Moist bread, dressing, pasta, and noodles. Well-moistened dry or cooked cereals, such as farina (cooked wheat cereal), oatmeal, or grits. Biscuits, breads, muffins, pancakes, and waffles that have been well moistened. Vegetables Shredded lettuce. Cooked, tender vegetables, including potatoes without skins. Vegetable juices. Broths or creamed soups made with vegetables that are not stringy or chewy. Strained tomatoes (without seeds). Fruits Canned or well-cooked fruits. Soft (ripe), peeled fresh fruits, such as peaches, nectarines, kiwi, cantaloupe, honeydew melon, and watermelon (without seeds). Soft berries with small seeds, such as strawberries. Fruit juices (without pulp). Meats and Other Protein Sources Moist, tender, lean beef. Mutton. Lamb. Veal. Chicken. [place]. Liver. Ham. Fish without bones. Eggs. Dairy Milk, milk drinks, and cream. Plain cream cheese and cottage cheese. Plain yogurt. Sweets/Desserts Flavored gelatin desserts. Custard. Plain ice cream, frozen yogurt, sherbet, milk shakes, and malts. Plain cakes and cookies. Plain hard candy. Other Butter, margarine (without trans fat), and cooking oils. Mayonnaise. Cream sauces. Mild spices, salt, and sugar. Syrup, molasses, honey, and jelly. The items listed above may not be a complete list of recommended  foods or beverages. Contact your dietitian for more options. What foods are not recommended? Grains Dry bread, toast, crackers that have not been moistened. Coarse or dry cereals, such as bran, granola, and shredded wheat. Tough or chewy crusty breads, such as [place] bread or baguettes. Vegetables Corn. Raw vegetables except shredded lettuce. Cooked vegetables that are tough or stringy. Tough, crisp, fried potatoes and potato skins. Fruits Fresh fruits with skins or seeds or both, such as apples, pears, or grapes. Stringy, high-pulp fruits, such as papaya, pineapple, coconut, or mango. Fruit leather, fruit roll-ups, and all dried fruits. Meats and Other Protein Sources Sausages and hot dogs. Meats with gristle. Fish with bones. Nuts, seeds, and chunky peanut or other nut butters. Sweets/Desserts Cakes or cookies that are very dry or chewy. The items listed above may not be a complete list of foods and beverages to avoid. Contact your dietitian for more information. This information is not intended to replace advice given to you by your health care provider. Make sure  you discuss any questions you have with your health care provider. Document Released: 12/04/2007 Document Revised: 02/02/2016 Document Reviewed: 07/24/2013 Elsevier Interactive Patient Education  2017 [employer].

## 2018-07-26 NOTE — Progress Notes (Signed)
[facility] Progress Note  5 Days Post-Op  Subjective: Multiple BMs. Doing well.  Objective: Vital signs in last 24 hours: Temp:  [98.3 F (36.8 C)-98.7 F (37.1 C)] 98.4 F (36.9 C) (11/16 0616) Pulse Rate:  [65-81] 65 (11/16 0616) Resp:  [16-20] 16 (11/16 0616) BP: (121-143)/(75-85) 121/75 (11/16 0616) SpO2:  [96 %-99 %] 98 % (11/16 0616) Last BM Date: 07/25/18  Intake/Output from previous day: No intake/output data recorded. Intake/Output this shift: Total I/O In: 240 [P.O.:240] Out: -   General appearance: alert, cooperative and no distress Resp: normal work breathing GI: soft, mildly distended, staples in place, no erythema or drainage  Anti-infectives: Anti-infectives (From admission, onward)   Start     Dose/Rate Route Frequency Ordered Stop   07/21/18 0600  cefoTEtan (CEFOTAN) 2 g in sodium chloride 0.9 % 100 mL IVPB     2 g 200 mL/hr over 30 Minutes Intravenous On call to O.R. 07/20/18 1559 07/21/18 1307      Assessment/Plan: [name] is a 67 yo POD 5 s/p Ex lap, SBR and LOA for SBO. Doing great. Reg diet Bowel regimen/ Rx for home Is mobile, able to touch toes, not on narcotics, can d/c home and is able to drive himself, feels like he can change gears and slam on breaks, of sound mind and judgement to make this decision    LOS: 9 days    Lucretia RoersLindsay C Bridges 07/26/2018

## 2018-08-05 ENCOUNTER — Ambulatory Visit (INDEPENDENT_AMBULATORY_CARE_PROVIDER_SITE_OTHER): Payer: Self-pay | Admitting: General Surgery

## 2018-08-05 ENCOUNTER — Encounter: Payer: Self-pay | Admitting: General Surgery

## 2018-08-05 VITALS — BP 133/72 | HR 60 | Temp 96.8°F | Resp 18 | Wt 160.6 lb

## 2018-08-05 DIAGNOSIS — K56609 Unspecified intestinal obstruction, unspecified as to partial versus complete obstruction: Secondary | ICD-10-CM

## 2018-08-05 NOTE — Progress Notes (Signed)
[facility] Note   HPI:  67 y.o. Male presents to clinic for post-op follow-up evaluation after a Ex lap, SBR for SBO. Patient reports that he feels that he is losing his muscle due to inabitlity to exercise normally.   Review of Systems:  Tolerating diet Having Bms No pain No fever or chills No drainage from wound All other review of systems: otherwise negative   Vital Signs:  BP 133/72 (BP Location: Left Arm, Patient Position: Sitting, Cuff Size: Normal)   Pulse 60   Temp (!) 96.8 F (36 C) (Temporal)   Resp 18   Wt 160 lb 9.6 oz (72.8 kg)   BMI 26.73 kg/m    Physical Exam:  Physical Exam  Constitutional: He appears well-developed.  Cardiovascular: Normal rate.  Pulmonary/Chest: Effort normal.  Abdominal: Soft. He exhibits no distension. There is no tenderness.  Midline healing, staples removed, no erythema or drainage, diastasis superior to incision, no hernia at incision appreciated  Vitals reviewed.  Assessment:  67 y.o. yo Male s/p Ex lap, SBR For SBO. Doing well. Has some degree of diastasis in the epigastric region, no true hernia appreciated.   Plan:  - Follow up PRN   - Restrictions of no lifting > 10 lbs, no excessive bending, pushing, pulling, squatting covered again - Information on diastasis given and recommendations for exercises once he is out 6-8 weeks   All of the above recommendations were discussed with the patient, and all of patient's questions were answered to his expressed satisfaction.  Algis GreenhouseLindsay Sanjuanita Condrey, MD [facility] [address] [place], [place] [id number] [phone] (office)

## 2018-08-05 NOTE — Patient Instructions (Addendum)
Do not lift > 10 lbs, perform excessive bending, pushing, pulling, squatting for 6-8 weeks after surgery.  The activity restrictions and the abdominal binder are to prevent hernia formation at your incision while you are healing. The upper part of your abdomen has some diastasis recti (see below).   Diastasis Recti Diastasis recti is when the muscles of the abdomen (rectus abdominis muscles) become thin and separate. The result is a wider space between the right and left abdomen (abdominal) muscles. This wider space between the muscles may cause a bulge in the middle of your abdomen. You may notice this bulge when you are straining or when you sit up from a lying down position. Diastasis recti can affect men and women. It is most common among pregnant women, infants, people who are obese, and people who have had abdominal surgery. Exercise or surgical treatment may help correct it. What are the causes? Common causes of this condition include:  Pregnancy. The growing uterus puts pressure on the abdominal muscles, which causes the muscles to separate.  Obesity. Excess fat puts pressure on abdominal muscles.  Weightlifting.  Some abdomen exercises.  Advanced age.  Genetics.  Prior abdominal surgery.  What increases the risk? This condition is more likely to develop in:  Women.  Newborns, especially newborns who are born early (prematurely).  What are the signs or symptoms? Common symptoms of this condition include:  A bulge in the middle of the abdomen. You will notice it most when you sit up or strain.  Pain in the low back, pelvis, or hips.  Constipation.  Inability to control when you urinate (urinary incontinence).  Bloating.  Poor posture.  How is this diagnosed? This condition is diagnosed with a physical exam. Your health care provider will ask you to lie flat on your back and do a crunch or half sit-up. If you have diastasis recti, a vertical bulge will appear  between your abdominal muscles in the center of your abdomen. Your health care provider will measure the gap between your muscles with one of the following:  A medical device used to measure the space between two objects (caliper).  A tape measure.  CT scan.  Ultrasound.  Finger spaces. Your health care provider will measure the space using their fingers.  How is this treated? If your muscle separation is not too large, you may not need treatment. However, if you are a woman who plans to become pregnant again, you should treat this condition before your next pregnancy. Treatment may include:  Physical therapy to strengthen and tighten your abdominal muscles.  Lifestyle changes such as weight loss and exercise.  Over-the-counter pain medicines as needed.  Surgery to correct the separation.  Follow these instructions at home: Activity  Return to your normal activities as told by your health care provider. Ask your health care provider what activities are safe for you.  When lifting weights or doing exercises using your abdominal muscles or the muscles in the center of your body that give stability (core muscles), make sure you are doing your exercises and movements correctly. Proper form can help to prevent the condition from happening again. General instructions  If you are overweight, ask your health care provider for help with weight loss. Losing even a small amount of weight can help to improve your diastasis recti.  Take over-the-counter or prescription medicines only as told by your health care provider.  Do not strain. Straining can make the separation worse. Examples of straining include: ?  Pushing hard to have a bowel movement, such as due to constipation. ? Lifting heavy objects, including children. ? Standing up and sitting down.  Take steps to prevent constipation: ? Drink enough fluid to keep your urine clear or pale yellow. ? Take over-the-counter or prescription  medicines only as directed. ? Eat foods that are high in fiber, such as fresh fruits and vegetables, whole grains, and beans. ? Limit foods that are high in fat and processed sugars, such as fried and sweet foods. Contact a health care provider if:  You notice a new bulge in your abdomen. Get help right away if:  You experience severe discomfort in your abdomen.  You develop severe abdominal pain along with nausea, vomiting, or fever. Summary  Diastasis recti is when the abdomen (abdominal) muscles become thin and separate. Your abdomen will stick out because the space between your right and left abdomen muscles has widened.  The most common symptom is a bulge in your abdomen. You will notice it most when you sit up or are straining.  This condition is diagnosed during a physical exam.  If the abdomen separation is not too big, you may choose not to have treatment. Otherwise, you may need to undergo physical therapy or surgery. This information is not intended to replace advice given to you by your health care provider. Make sure you discuss any questions you have with your health care provider. Document Released: 10/22/2016 Document Revised: 10/22/2016 Document Reviewed: 10/22/2016 Elsevier Interactive Patient Education  [employer]2018 Elsevier Inc.

## 2018-08-22 ENCOUNTER — Ambulatory Visit: Payer: Self-pay | Admitting: Gastroenterology

## 2018-08-28 ENCOUNTER — Encounter (HOSPITAL_COMMUNITY): Payer: Self-pay | Admitting: *Deleted

## 2018-08-28 ENCOUNTER — Encounter (HOSPITAL_COMMUNITY)
Admission: RE | Disposition: A | Discharge status: Home / Self Care | Payer: Self-pay | Source: Home / Self Care | Attending: Internal Medicine

## 2018-08-28 ENCOUNTER — Other Ambulatory Visit: Payer: Self-pay

## 2018-08-28 ENCOUNTER — Ambulatory Visit (HOSPITAL_COMMUNITY)
Admission: RE | Admit: 2018-08-28 | Discharge: 2018-08-28 | Disposition: A | Discharge status: Home / Self Care | Payer: 59 | Attending: Internal Medicine | Admitting: Internal Medicine

## 2018-08-28 DIAGNOSIS — Z791 Long term (current) use of non-steroidal anti-inflammatories (NSAID): Secondary | ICD-10-CM | POA: Diagnosis not present

## 2018-08-28 DIAGNOSIS — E119 Type 2 diabetes mellitus without complications: Secondary | ICD-10-CM | POA: Insufficient documentation

## 2018-08-28 DIAGNOSIS — E7800 Pure hypercholesterolemia, unspecified: Secondary | ICD-10-CM | POA: Insufficient documentation

## 2018-08-28 DIAGNOSIS — Z7984 Long term (current) use of oral hypoglycemic drugs: Secondary | ICD-10-CM | POA: Insufficient documentation

## 2018-08-28 DIAGNOSIS — K228 Other specified diseases of esophagus: Secondary | ICD-10-CM | POA: Diagnosis not present

## 2018-08-28 DIAGNOSIS — K219 Gastro-esophageal reflux disease without esophagitis: Secondary | ICD-10-CM | POA: Diagnosis not present

## 2018-08-28 DIAGNOSIS — Z7982 Long term (current) use of aspirin: Secondary | ICD-10-CM | POA: Diagnosis not present

## 2018-08-28 DIAGNOSIS — I10 Essential (primary) hypertension: Secondary | ICD-10-CM | POA: Diagnosis not present

## 2018-08-28 DIAGNOSIS — K449 Diaphragmatic hernia without obstruction or gangrene: Secondary | ICD-10-CM | POA: Insufficient documentation

## 2018-08-28 DIAGNOSIS — Z79899 Other long term (current) drug therapy: Secondary | ICD-10-CM | POA: Diagnosis not present

## 2018-08-28 HISTORY — PX: ESOPHAGOGASTRODUODENOSCOPY: SHX5428

## 2018-08-28 LAB — GLUCOSE, CAPILLARY: Glucose-Capillary: 93 mg/dL (ref 70–99)

## 2018-08-28 SURGERY — EGD (ESOPHAGOGASTRODUODENOSCOPY)
Anesthesia: Moderate Sedation

## 2018-08-28 MED ORDER — STERILE WATER FOR IRRIGATION IR SOLN
Status: DC | PRN
  Administered 2018-08-28: 100 mL

## 2018-08-28 MED ORDER — MEPERIDINE HCL 50 MG/ML IJ SOLN
INTRAMUSCULAR | Status: DC | PRN
  Administered 2018-08-28 (×2): 25 mg via INTRAVENOUS

## 2018-08-28 MED ORDER — PANTOPRAZOLE SODIUM 40 MG PO TBEC: 40.0000 mg | DELAYED_RELEASE_TABLET | Freq: Two times a day (BID) | ORAL | 2 refills | Status: DC

## 2018-08-28 MED ORDER — FAMOTIDINE 20 MG PO TABS: 20.0000 mg | ORAL_TABLET | Freq: Every day | ORAL | Status: DC

## 2018-08-28 MED ORDER — MIDAZOLAM HCL 5 MG/5ML IJ SOLN
INTRAMUSCULAR | Status: AC
  Filled 2018-08-28: qty 10

## 2018-08-28 MED ORDER — LIDOCAINE VISCOUS HCL 2 % MT SOLN
OROMUCOSAL | Status: AC
  Filled 2018-08-28: qty 15

## 2018-08-28 MED ORDER — MIDAZOLAM HCL 5 MG/5ML IJ SOLN
INTRAMUSCULAR | Status: DC | PRN
  Administered 2018-08-28: 2 mg via INTRAVENOUS
  Administered 2018-08-28: 1 mg via INTRAVENOUS
  Administered 2018-08-28: 2 mg via INTRAVENOUS

## 2018-08-28 MED ORDER — SODIUM CHLORIDE 0.9 % IV SOLN
INTRAVENOUS | Status: DC
  Administered 2018-08-28: 1000 mL via INTRAVENOUS

## 2018-08-28 MED ORDER — MEPERIDINE HCL 50 MG/ML IJ SOLN
INTRAMUSCULAR | Status: AC
  Filled 2018-08-28: qty 1

## 2018-08-28 NOTE — Discharge Instructions (Signed)
Esophagogastroduodenoscopy °Esophagogastroduodenoscopy (EGD) is a procedure to examine the lining of the esophagus, stomach, and first part of the small intestine (duodenum). This procedure is done to check for problems such as inflammation, bleeding, ulcers, or growths. °During this procedure, a long, flexible, lighted tube with a camera attached (endoscope) is inserted down the throat. °Tell a health care provider about: °· Any allergies you have. °· All medicines you are taking, including vitamins, herbs, eye drops, creams, and over-the-counter medicines. °· Any problems you or family members have had with anesthetic medicines. °· Any blood disorders you have. °· Any surgeries you have had. °· Any medical conditions you have. °· Whether you are pregnant or may be pregnant. °What are the risks? °Generally, this is a safe procedure. However, problems may occur, including: °· Infection. °· Bleeding. °· A tear (perforation) in the esophagus, stomach, or duodenum. °· Trouble breathing. °· Excessive sweating. °· Spasms of the larynx. °· A slowed heartbeat. °· Low blood pressure. ° °What happens before the procedure? °· Follow instructions from your health care provider about eating or drinking restrictions. °· Ask your health care provider about: °? Changing or stopping your regular medicines. This is especially important if you are taking diabetes medicines or blood thinners. °? Taking medicines such as aspirin and ibuprofen. These medicines can thin your blood. Do not take these medicines before your procedure if your health care provider instructs you not to. °· Plan to have someone take you home after the procedure. °· If you wear dentures, be ready to remove them before the procedure. °What happens during the procedure? °· To reduce your risk of infection, your health care team will wash or sanitize their hands. °· An IV tube will be put in a vein in your hand or arm. You will get medicines and fluids through this  tube. °· You will be given one or more of the following: °? A medicine to help you relax (sedative). °? A medicine to numb the area (local anesthetic). This medicine may be sprayed into your throat. It will make you feel more comfortable and keep you from gagging or coughing during the procedure. °? A medicine for pain. °· A mouth guard may be placed in your mouth to protect your teeth and to keep you from biting on the endoscope. °· You will be asked to lie on your left side. °· The endoscope will be lowered down your throat into your esophagus, stomach, and duodenum. °· Air will be put into the endoscope. This will help your health care provider see better. °· The lining of your esophagus, stomach, and duodenum will be examined. °· Your health care provider may: °? Take a tissue sample so it can be looked at in a lab (biopsy). °? Remove growths. °? Remove objects (foreign bodies) that are stuck. °? Treat any bleeding with medicines or other devices that stop tissue from bleeding. °? Widen (dilate) or stretch narrowed areas of your esophagus and stomach. °· The endoscope will be taken out. °The procedure may vary among health care providers and hospitals. °What happens after the procedure? °· Your blood pressure, heart rate, breathing rate, and blood oxygen level will be monitored often until the medicines you were given have worn off. °· Do not eat or drink anything until the numbing medicine has worn off and your gag reflex has returned. °This information is not intended to replace advice given to you by your health care provider. Make sure you discuss any questions you   have with your health care provider. Document Released: 12/28/2004 Document Revised: 02/02/2016 Document Reviewed: 07/21/2015 Elsevier Interactive Patient Education  2018 [employer] Discontinue Zantac or ranitidine. Pantoprazole 40 mg by mouth 30 minutes before breakfast and evening meal daily. Famotidine or Pepcid OTC 20 mg daily at  bedtime. Resume usual diet. No driving for 24 hours. Please call office with progress report in 3 to 4 weeks.

## 2018-08-28 NOTE — H&P (Signed)
[name] Boyle is an 67 y.o. male.   Chief Complaint: Patient is here for EGD. HPI: Patient is 67 year old African-American male who has had throat symptoms since May last year.  He has been evaluated by ENT specialist and found to have GERD.  However he has not responded therapy.  He is presently on pantoprazole in the morning and H2 in in the evening and feels 2050% better.  He rarely has heartburn.  He denies nausea vomiting dysphagia abdominal pain or melena.  He has good appetite.  He lost about 6 pounds last appointment was hospitalized for small bowel obstruction for which she underwent adhesio lysis. He does not smoke cigarettes and drinks alcohol every now and then but no more than 1 or 2 drinks at a time.  Past Medical History:  Diagnosis Date  . Diabetes mellitus without complication (HCC)   . GERD (gastroesophageal reflux disease)   . High cholesterol   . Hypertension     Past Surgical History:  Procedure Laterality Date  . EX[facility]ORAT[facility]  07[facility]2017   [facility] [place]   . KNEE SURGERY    . LAPAROTOMY N/A 07/21/2018   Procedure: EXPLORATORY LAPAROTOMY, SMALL BOWEL RESECTION;  Surgeon: Lucretia RoersBridges, Lindsay C, MD;  Location: [facility];  Service: General;  Laterality: N/A;  . LYSIS OF ADHESION N/A 07/21/2018   Procedure: LYSIS OF ADHESION;  Surgeon: Lucretia RoersBridges, Lindsay C, MD;  Location: [facility];  Service: General;  Laterality: N/A;    Family History  Problem Relation Age of Onset  . Diabetes Father   . Diabetes Maternal Grandfather    Social History:  reports that he has never smoked. He has never used smokeless tobacco. He reports current alcohol use. He reports that he does not use drugs.  Allergies: No Known Allergies  Medications Prior to Admission  Medication Sig Dispense Refill  . amLODipine (NORVASC) 10 MG tablet Take 10 mg by mouth every morning.     . Ascorbic Acid (VITAMIN C) 1000 MG tablet Take 1,000 mg by mouth every morning.     Marland Kitchen. aspirin EC 81 MG  tablet Take 81 mg by mouth daily.    Marland Kitchen. atorvastatin (LIPITOR) 40 MG tablet Take 40 mg by mouth every evening.     Marland Kitchen. azelastine (ASTELIN) 0.1 % nasal spray Place 1 spray into both nostrils every morning. Use in each nostril as directed-May use in the evening as needed    . B Complex-C (SUPER B COMPLEX PO) Take 1 tablet by mouth daily.    . Cholecalciferol (VITAMIN D-3) 125 MCG (5000 UT) TABS Take 5,000 Units by mouth daily.    . Coenzyme Q10 300 MG CAPS Take 300 mg by mouth daily.    Marland Kitchen. docusate sodium (COLACE) 50 MG capsule Take 50 mg by mouth 2 (two) times daily.    . enalapril (VASOTEC) 10 MG tablet Take 10 mg by mouth 2 (two) times daily.    . fexofenadine (HM FEXOFENADINE HCL) 180 MG tablet Take 180 mg by mouth every morning.     . fluticasone (FLONASE) 50 MCG/ACT nasal spray Place 2 sprays into both nostrils every morning.     . Glucosamine HCl 1500 MG TABS Take 1,500 mg by mouth daily.    Marland Kitchen. ibuprofen (ADVIL,MOTRIN) 800 MG tablet Take 800 mg by mouth every 8 (eight) hours as needed for pain.  0  . metFORMIN (GLUCOPHAGE) 500 MG tablet Take 500 mg by mouth every evening.     . Multiple Vitamin (  MULTIVITAMIN WITH MINERALS) TABS tablet Take 1 tablet by mouth daily.    . naphazoline-pheniramine (ALLERGY EYE) 0.025-0.3 % ophthalmic solution Place 1 drop into both eyes daily.    . Omega-3 Fatty Acids (FISH OIL ULTRA) 1400 MG CAPS Take 1,400 mg by mouth daily.    . pantoprazole (PROTONIX) 40 MG tablet Take 1 tablet (40 mg total) by mouth daily. (Patient taking differently: Take 40 mg by mouth every morning. ) 90 tablet 3  . Polyethyl Glycol-Propyl Glycol (LUBRICANT EYE DROPS) 0.4-0.3 % SOLN Place 1 drop into both eyes daily.    . ranitidine (ZANTAC) 150 MG tablet Take 150 mg by mouth daily as needed for heartburn.     . sennosides-docusate sodium (SENOKOT-S) 8.6-50 MG tablet Take 2 tablets by mouth at bedtime.    . triamcinolone cream (KENALOG) 0.1 % Apply 1 application topically 2 (two) times daily  as needed for itching.  3  . docusate sodium (COLACE) 100 MG capsule Take 1 capsule (100 mg total) by mouth 2 (two) times daily. (Patient not taking: Reported on 08/22/2018) 10 capsule 0  . ibuprofen (ADVIL,MOTRIN) 600 MG tablet Take 1 tablet (600 mg total) by mouth every 8 (eight) hours as needed for fever, headache, moderate pain or cramping. (Patient not taking: Reported on 08/22/2018) 12 tablet 0  . Ustekinumab (STELARA) 45 MG/0.5ML SOLN Inject 0.5 mLs into the skin once a week. Every 12 weeks. Due in January      No results found for this or any previous visit (from the past 48 hour(s)). No results found.  ROS  Blood pressure (!) 175/85, pulse (!) 54, temperature 98.5 F (36.9 C), temperature source Oral, resp. rate 13, height 5\' 5"  (1.651 m), weight 74.8 kg, SpO2 100 %. Physical Exam  Constitutional: He appears well-developed and well-nourished.  HENT:  Mouth/Throat: Oropharynx is clear and moist.  Eyes: Conjunctivae are normal. No scleral icterus.  Neck: No thyromegaly present.  Cardiovascular: Normal rate, regular rhythm and normal heart sounds.  No murmur heard. Respiratory: Effort normal and breath sounds normal.  GI:  Midline scar.  Abdomen is soft and nontender with organomegaly or masses.  Musculoskeletal:        General: No edema.  Lymphadenopathy:    He has no cervical adenopathy.  Neurological: He is alert.  Skin: Skin is warm and dry.     Assessment/Plan Atypical GERD symptoms poorly responsive to therapy. Diagnostic EGD.  [name], MD 08/28/2018, 2:34 PM

## 2018-08-28 NOTE — Op Note (Signed)
[facility] Patient Name: [name] GermanJames Boyle Procedure Date: 08/28/2018 2:09 PM MRN: [id number] Date of Birth: 08/09/1951 Attending MD: [name] DecemberNajeeb Blane Boyle , MD CSN: [id number] Age: [age] Admit Type: Outpatient Procedure:                Upper GI endoscopy Indications:              Follow-up of gastro-esophageal reflux disease Providers:                [name] DecemberNajeeb Kyndra Condron, MD, [name] Kinsammy Vaught, RN, [name] Boyle Referring MD:             [name] MaroonJames Y. Kim, MD Medicines:                Lidocaine spray, Meperidine 50 mg IV, Midazolam 5                            mg IV Complications:            No immediate complications. Estimated Blood Loss:     Estimated blood loss: none. Procedure:                Pre-Anesthesia Assessment:                           - Prior to the procedure, a History and Physical                            was performed, and patient medications and                            allergies were reviewed. The patient's tolerance of                            previous anesthesia was also reviewed. The risks                            and benefits of the procedure and the sedation                            options and risks were discussed with the patient.                            All questions were answered, and informed consent                            was obtained. Prior Anticoagulants: The patient                            last took aspirin 3 days prior to the procedure.                            ASA Grade Assessment: II - A patient with mild                            systemic disease. After reviewing the risks and  benefits, the patient was deemed in satisfactory                            condition to undergo the procedure.                           After obtaining informed consent, the endoscope was                            passed under direct vision. Throughout the                            procedure, the patient's blood pressure, pulse, and                oxygen saturations were monitored continuously. The                            GIF-H190 ([id number] scope was introduced through the                            mouth, and advanced to the second part of duodenum.                            The upper GI endoscopy was accomplished without                            difficulty. The patient tolerated the procedure                            well. Scope In: 2:46:21 PM Scope Out: 2:52:46 PM Total Procedure Duration: 0 hours 6 minutes 25 seconds  Findings:      The examined esophagus was normal.      The Z-line was irregular and was found 40 cm from the incisors.      A 2 cm hiatal hernia was present.      The entire examined stomach was normal.      The duodenal bulb and second portion of the duodenum were normal. Impression:               - Normal esophagus.                           - Z-line irregular, 40 cm from the incisors.                           - 2 cm hiatal hernia.                           - Normal stomach.                           - Normal duodenal bulb and second portion of the                            duodenum.                           -  No specimens collected. Moderate Sedation:      Moderate (conscious) sedation was administered by the endoscopy nurse       and supervised by the endoscopist. The following parameters were       monitored: oxygen saturation, heart rate, blood pressure, CO2       capnography and response to care. Total physician intraservice time was       12 minutes. Recommendation:           - Patient has a contact number available for                            emergencies. The signs and symptoms of potential                            delayed complications were discussed with the                            patient. Return to normal activities tomorrow.                            Written discharge instructions were provided to the                            patient.                            - Resume previous diet today.                           - Continue present medications but stop Zantac.                           - Increase Pantoprazole 40 mg po bib.                           - Telephone GI clinic in 3 weeks. Procedure Code(s):        --- Professional ---                           [phone], Esophagogastroduodenoscopy, flexible,                            transoral; diagnostic, including collection of                            specimen(s) by brushing or washing, when performed                            (separate procedure)                           G0500, Moderate sedation services provided by the                            same physician or other qualified health care  professional performing a gastrointestinal                            endoscopic service that sedation supports,                            requiring the presence of an independent trained                            observer to assist in the monitoring of the                            patient's level of consciousness and physiological                            status; initial 15 minutes of intra-service time;                            patient age 20 years or older (additional time may                            be reported with 16109, as appropriate) Diagnosis Code(s):        --- Professional ---                           K22.8, Other specified diseases of esophagus                           K44.9, Diaphragmatic hernia without obstruction or                            gangrene                           K21.9, Gastro-esophageal reflux disease without                            esophagitis CPT copyright 2018 American [employer]. All rights reserved. The codes documented in this report are preliminary and upon coder review may  be revised to meet current compliance requirements. [name] December, MD [name] December, MD 08/28/2018 3:05:05 PM This report has been signed  electronically. Number of Addenda: 0

## 2018-09-05 ENCOUNTER — Encounter (HOSPITAL_COMMUNITY): Payer: Self-pay | Admitting: Internal Medicine

## 2018-12-03 ENCOUNTER — Other Ambulatory Visit (INDEPENDENT_AMBULATORY_CARE_PROVIDER_SITE_OTHER): Payer: Self-pay | Admitting: Internal Medicine

## 2018-12-03 DIAGNOSIS — K219 Gastro-esophageal reflux disease without esophagitis: Secondary | ICD-10-CM

## 2019-02-16 ENCOUNTER — Ambulatory Visit (INDEPENDENT_AMBULATORY_CARE_PROVIDER_SITE_OTHER): Payer: 59 | Admitting: Internal Medicine

## 2019-02-16 ENCOUNTER — Other Ambulatory Visit: Payer: Self-pay

## 2019-02-16 ENCOUNTER — Encounter (INDEPENDENT_AMBULATORY_CARE_PROVIDER_SITE_OTHER): Payer: Self-pay | Admitting: Internal Medicine

## 2019-02-16 VITALS — BP 150/79 | HR 66 | Temp 98.3°F | Ht 65.0 in | Wt 180.6 lb

## 2019-02-16 DIAGNOSIS — K2100 Gastro-esophageal reflux disease with esophagitis, without bleeding: Secondary | ICD-10-CM

## 2019-02-16 DIAGNOSIS — Z86010 Personal history of colonic polyps: Secondary | ICD-10-CM

## 2019-02-16 MED ORDER — DEXLANSOPRAZOLE 60 MG PO CPDR: 60.0000 mg | DELAYED_RELEASE_CAPSULE | Freq: Every day | ORAL | 3 refills | Status: DC

## 2019-02-16 NOTE — Progress Notes (Addendum)
Subjective:    Patient ID: [name] Boyle, male    DOB: July 01, 1951, 68 y.o.   MRN: [id number]  HPI Here today for f/u. Underwent an EGD in December of 2019 (follow up for GERD) EGD was normal.  Takes Protonix daily and Pepcid. States he has marginal relief.  Also states he needs a colonoscopy. Last colonoscopy in 2017. Said he had a colonoscopy. No family hx of colon cancer. His appetite is okay.   Biopsy on Colonoscopy was a sessile serrated polyp.  Colonoscopy normal except for hemorrhoids.  Review of Systems Past Medical History:  Diagnosis Date  . Diabetes mellitus without complication ([place])   . GERD (gastroesophageal reflux disease)   . High cholesterol   . Hypertension     Past Surgical History:  Procedure Laterality Date  . ESOPHAGOGASTRODUODENOSCOPY N/A 08/28/2018   Procedure: ESOPHAGOGASTRODUODENOSCOPY (EGD);  Surgeon: Rogene Houston, MD;  Location: [facility];  Service: Endoscopy;  Laterality: N/A;  2:00  . [facility]ATORY [facility]  07[facility]2017   [facility]   . KNEE [facility]GERY    . LAPAROTOMY N/A 07/21/2018   Procedure: EXPLORATORY LAPAROTOMY, SMALL BOWEL RESECTION;  Surgeon: Virl Cagey, MD;  Location: [facility];  Service: General;  Laterality: N/A;  . LYSIS OF ADHESION N/A 07/21/2018   Procedure: LYSIS OF ADHESION;  Surgeon: Virl Cagey, MD;  Location: [facility];  Service: General;  Laterality: N/A;    No Known Allergies  Current Outpatient Medications on File Prior to Visit  Medication Sig Dispense Refill  . amLODipine (NORVASC) 10 MG tablet Take 10 mg by mouth every morning.     . Ascorbic Acid (VITAMIN C) 1000 MG tablet Take 1,000 mg by mouth every morning.     Marland Kitchen aspirin EC 81 MG tablet Take 81 mg by mouth daily.    Marland Kitchen atorvastatin (LIPITOR) 40 MG tablet Take 40 mg by mouth every evening.     Marland Kitchen azelastine (ASTELIN) 0.1 % nasal spray Place 1 spray into both nostrils every morning. Use in each nostril as directed-May use in the  evening as needed    . B Complex-C (SUPER B COMPLEX PO) Take 1 tablet by mouth daily.    . Cholecalciferol (VITAMIN D-3) 125 MCG (5000 UT) TABS Take 5,000 Units by mouth daily.    . Coenzyme Q10 300 MG CAPS Take 300 mg by mouth daily.    Marland Kitchen docusate sodium (COLACE) 50 MG capsule Take 50 mg by mouth 2 (two) times daily.    . enalapril (VASOTEC) 10 MG tablet Take 10 mg by mouth 2 (two) times daily.    . famotidine (PEPCID) 20 MG tablet Take 1 tablet (20 mg total) by mouth at bedtime.    . fexofenadine (HM FEXOFENADINE HCL) 180 MG tablet Take 180 mg by mouth every morning.     . fluticasone (FLONASE) 50 MCG/ACT nasal spray Place 2 sprays into both nostrils every morning.     . Glucosamine HCl 1500 MG TABS Take 1,500 mg by mouth daily.    Marland Kitchen ibuprofen (ADVIL,MOTRIN) 800 MG tablet Take 800 mg by mouth every 8 (eight) hours as needed for pain.  0  . metFORMIN (GLUCOPHAGE) 500 MG tablet Take 500 mg by mouth every evening.     . Multiple Vitamin (MULTIVITAMIN WITH MINERALS) TABS tablet Take 1 tablet by mouth daily.    . naphazoline-pheniramine (ALLERGY EYE) 0.025-0.3 % ophthalmic solution Place 1 drop into both eyes daily.    . Omega-3 Fatty  Acids (FISH OIL ULTRA) 1400 MG CAPS Take 1,400 mg by mouth daily.    . pantoprazole (PROTONIX) 40 MG tablet TAKE 1 TABLET (40 MG TOTAL) BY MOUTH 2 (TWO) TIMES DAILY BEFORE A MEAL. 60 tablet 2  . Polyethyl Glycol-Propyl Glycol (LUBRICANT EYE DROPS) 0.4-0.3 % SOLN Place 1 drop into both eyes daily.    . sennosides-docusate sodium (SENOKOT-S) 8.6-50 MG tablet Take 2 tablets by mouth at bedtime.    . triamcinolone cream (KENALOG) 0.1 % Apply 1 application topically 2 (two) times daily as needed for itching.  3   No current facility-administered medications on file prior to visit.         Objective:   Physical Exam Blood pressure (!) 150/79, pulse 66, temperature 98.3 F (36.8 C), height 5\' 5"  (1.651 m), weight 180 lb 9.6 oz (81.9 kg). Alert and oriented. Skin warm  and dry. Oral mucosa is moist.   . Sclera anicteric, conjunctivae is pink. Thyroid not enlarged. No cervical lymphadenopathy. Lungs clear. Heart regular rate and rhythm.  Abdomen is soft. Bowel sounds are positive. No hepatomegaly. No abdominal masses felt. No tenderness.  No edema to lower extremities.         Assessment & Plan:  GERD. Rx for Dexilant sent to his pharmacy. Continue the Pepcid at night.  Last colonoscopy in 2017 with sessile serrated polyps. He is due for a surveillance colonoscopy.

## 2019-02-16 NOTE — Patient Instructions (Addendum)
Rx for Dexilant sent to his pharmacy.  Continue the Pepcid at night.  Will schedule a colonoscopy.

## 2019-02-17 ENCOUNTER — Telehealth (INDEPENDENT_AMBULATORY_CARE_PROVIDER_SITE_OTHER): Payer: Self-pay | Admitting: Internal Medicine

## 2019-02-17 NOTE — Addendum Note (Signed)
Addended by: Butch Penny on: 02/17/2019 01:48 PM   Modules accepted: Orders, SmartSet

## 2019-02-17 NOTE — Telephone Encounter (Signed)
[name] Boyle, colonoscopy. I left message on his voice mail, that yes he is due for a colonoscopy.  Order is in

## 2019-02-18 ENCOUNTER — Encounter (INDEPENDENT_AMBULATORY_CARE_PROVIDER_SITE_OTHER): Payer: Self-pay | Admitting: *Deleted

## 2019-02-18 ENCOUNTER — Telehealth (INDEPENDENT_AMBULATORY_CARE_PROVIDER_SITE_OTHER): Payer: Self-pay | Admitting: *Deleted

## 2019-02-18 DIAGNOSIS — Z860100 Personal history of colon polyps, unspecified: Secondary | ICD-10-CM | POA: Insufficient documentation

## 2019-02-18 MED ORDER — PEG 3350-KCL-NA BICARB-NACL 420 G PO SOLR: 4000.0000 mL | Freq: Once | ORAL | 0 refills | Status: AC

## 2019-02-18 NOTE — Telephone Encounter (Signed)
Patient needs trilyte 

## 2019-02-18 NOTE — Telephone Encounter (Signed)
TCS sch'd 03/26/19, patient aware, instructions mailed

## 2019-03-06 ENCOUNTER — Other Ambulatory Visit (INDEPENDENT_AMBULATORY_CARE_PROVIDER_SITE_OTHER): Payer: Self-pay | Admitting: Internal Medicine

## 2019-03-06 DIAGNOSIS — K219 Gastro-esophageal reflux disease without esophagitis: Secondary | ICD-10-CM

## 2019-03-23 ENCOUNTER — Other Ambulatory Visit (HOSPITAL_COMMUNITY)
Admission: RE | Admit: 2019-03-23 | Discharge: 2019-03-23 | Disposition: A | Discharge status: Home / Self Care | Payer: Medicare Other | Source: Ambulatory Visit | Attending: Internal Medicine | Admitting: Internal Medicine

## 2019-03-23 ENCOUNTER — Other Ambulatory Visit: Payer: Self-pay

## 2019-03-23 DIAGNOSIS — Z1159 Encounter for screening for other viral diseases: Secondary | ICD-10-CM | POA: Diagnosis present

## 2019-03-24 LAB — SARS CORONAVIRUS 2 (TAT 6-24 HRS): SARS Coronavirus 2: NEGATIVE

## 2019-03-26 ENCOUNTER — Other Ambulatory Visit: Payer: Self-pay

## 2019-03-26 ENCOUNTER — Encounter (HOSPITAL_COMMUNITY)
Admission: RE | Disposition: A | Discharge status: Home / Self Care | Payer: Self-pay | Source: Home / Self Care | Attending: Internal Medicine

## 2019-03-26 ENCOUNTER — Encounter (HOSPITAL_COMMUNITY): Payer: Self-pay

## 2019-03-26 ENCOUNTER — Ambulatory Visit (HOSPITAL_COMMUNITY)
Admission: RE | Admit: 2019-03-26 | Discharge: 2019-03-26 | Disposition: A | Discharge status: Home / Self Care | Payer: 59 | Attending: Internal Medicine | Admitting: Internal Medicine

## 2019-03-26 DIAGNOSIS — E119 Type 2 diabetes mellitus without complications: Secondary | ICD-10-CM | POA: Insufficient documentation

## 2019-03-26 DIAGNOSIS — Z09 Encounter for follow-up examination after completed treatment for conditions other than malignant neoplasm: Secondary | ICD-10-CM | POA: Insufficient documentation

## 2019-03-26 DIAGNOSIS — Z7982 Long term (current) use of aspirin: Secondary | ICD-10-CM | POA: Insufficient documentation

## 2019-03-26 DIAGNOSIS — Z79899 Other long term (current) drug therapy: Secondary | ICD-10-CM | POA: Diagnosis not present

## 2019-03-26 DIAGNOSIS — I10 Essential (primary) hypertension: Secondary | ICD-10-CM | POA: Diagnosis not present

## 2019-03-26 DIAGNOSIS — Z86010 Personal history of colonic polyps: Secondary | ICD-10-CM | POA: Insufficient documentation

## 2019-03-26 DIAGNOSIS — E7800 Pure hypercholesterolemia, unspecified: Secondary | ICD-10-CM | POA: Insufficient documentation

## 2019-03-26 DIAGNOSIS — Z7984 Long term (current) use of oral hypoglycemic drugs: Secondary | ICD-10-CM | POA: Diagnosis not present

## 2019-03-26 DIAGNOSIS — K219 Gastro-esophageal reflux disease without esophagitis: Secondary | ICD-10-CM | POA: Diagnosis not present

## 2019-03-26 HISTORY — PX: COLONOSCOPY: SHX5424

## 2019-03-26 LAB — GLUCOSE, CAPILLARY: Glucose-Capillary: 94 mg/dL (ref 70–99)

## 2019-03-26 SURGERY — COLONOSCOPY
Anesthesia: Moderate Sedation

## 2019-03-26 MED ORDER — MEPERIDINE HCL 50 MG/ML IJ SOLN
INTRAMUSCULAR | Status: DC | PRN
  Administered 2019-03-26 (×2): 25 mg via INTRAVENOUS

## 2019-03-26 MED ORDER — SODIUM CHLORIDE 0.9 % IV SOLN
INTRAVENOUS | Status: DC
  Administered 2019-03-26: 11:00:00 via INTRAVENOUS

## 2019-03-26 MED ORDER — STERILE WATER FOR IRRIGATION IR SOLN
Status: DC | PRN
  Administered 2019-03-26: 2.5 mL

## 2019-03-26 MED ORDER — MEPERIDINE HCL 50 MG/ML IJ SOLN
INTRAMUSCULAR | Status: AC
  Filled 2019-03-26: qty 1

## 2019-03-26 MED ORDER — MIDAZOLAM HCL 5 MG/5ML IJ SOLN
INTRAMUSCULAR | Status: DC | PRN
  Administered 2019-03-26 (×3): 2 mg via INTRAVENOUS

## 2019-03-26 MED ORDER — MIDAZOLAM HCL 5 MG/5ML IJ SOLN
INTRAMUSCULAR | Status: AC
  Filled 2019-03-26: qty 10

## 2019-03-26 NOTE — Op Note (Signed)
[facility] Patient Name: [name] Boyle Procedure Date: 03/26/2019 11:42 AM MRN: [id number] Date of Birth: 23-Jul-1951 Attending MD: Hildred Laser , MD CSN: [id number] Age: 68 Admit Type: Outpatient Procedure:                Colonoscopy Indications:              High risk colon cancer surveillance: Personal                            history of colonic polyps Providers:                Hildred Laser, MD, Otis Peak B. Sharon Seller, RN, Raphael Gibney, Technician Referring MD:             Arlyss Repress, MD Medicines:                Meperidine 50 mg IV, Midazolam 6 mg IV Complications:            No immediate complications. Estimated Blood Loss:     Estimated blood loss: none. Procedure:                Pre-Anesthesia Assessment:                           - Prior to the procedure, a History and Physical                            was performed, and patient medications and                            allergies were reviewed. The patient's tolerance of                            previous anesthesia was also reviewed. The risks                            and benefits of the procedure and the sedation                            options and risks were discussed with the patient.                            All questions were answered, and informed consent                            was obtained. Prior Anticoagulants: The patient has                            taken no previous anticoagulant or antiplatelet                            agents except for aspirin. ASA Grade Assessment: II                            -  A patient with mild systemic disease. After                            reviewing the risks and benefits, the patient was                            deemed in satisfactory condition to undergo the                            procedure.                           After obtaining informed consent, the colonoscope                            was passed under direct vision.  Throughout the                            procedure, the patient's blood pressure, pulse, and                            oxygen saturations were monitored continuously. The                            [id number]-H190DL ([id number] scope was introduced through                            the anus and advanced to the the cecum, identified                            by appendiceal orifice and ileocecal valve. The                            colonoscopy was performed without difficulty. The                            patient tolerated the procedure well. The quality                            of the bowel preparation was good. The ileocecal                            valve, appendiceal orifice, and rectum were                            photographed. Scope In: 12:43:39 PM Scope Out: 1:07:56 PM Scope Withdrawal Time: 0 hours 17 minutes 42 seconds  Total Procedure Duration: 0 hours 24 minutes 17 seconds  Findings:      The perianal and digital rectal examinations were normal.      The colon (entire examined portion) appeared normal.      The retroflexed view of the distal rectum and anal verge was normal and       showed no anal or rectal abnormalities. Impression:               -  The entire examined colon is normal.                           - The distal rectum and anal verge are normal on                            retroflexion view.                           - No specimens collected. Moderate Sedation:      Moderate (conscious) sedation was administered by the endoscopy nurse       and supervised by the endoscopist. The following parameters were       monitored: oxygen saturation, heart rate, blood pressure, CO2       capnography and response to care. Total physician intraservice time was       29 minutes. Recommendation:           - Patient has a contact number available for                            emergencies. The signs and symptoms of potential                            delayed complications  were discussed with the                            patient. Return to normal activities tomorrow.                            Written discharge instructions were provided to the                            patient.                           - Resume previous diet today.                           - Continue present medications.                           - Repeat colonoscopy in 5 years for surveillance. Procedure Code(s):        --- Professional ---                           [id number], Colonoscopy, flexible; diagnostic, including                            collection of specimen(s) by brushing or washing,                            when performed (separate procedure)                           99153, Moderate sedation; each additional 15  minutes intraservice time                           G0500, Moderate sedation services provided by the                            same physician or other qualified health care                            professional performing a gastrointestinal                            endoscopic service that sedation supports,                            requiring the presence of an independent trained                            observer to assist in the monitoring of the                            patient's level of consciousness and physiological                            status; initial 15 minutes of intra-service time;                            patient age 50 years or older (additional time may                            be reported with [id number], as appropriate) Diagnosis Code(s):        --- Professional ---                           Z86.010, Personal history of colonic polyps CPT copyright 2019 American Medical Association. All rights reserved. The codes documented in this report are preliminary and upon coder review may  be revised to meet current compliance requirements. [name], MD [name], MD 03/26/2019 1:16:10 PM This report has  been signed electronically. Number of Addenda: 0

## 2019-03-26 NOTE — Discharge Instructions (Signed)
Resume usual medications including aspirin as before. Resume usual diet. No driving for 24 hours. Next colonoscopy in 5 years.       Colonoscopy, Adult, Care After This sheet gives you information about how to care for yourself after your procedure. Your doctor may also give you more specific instructions. If you have problems or questions, call your doctor. What can I expect after the procedure? After the procedure, it is common to have:  A small amount of blood in your poop for 24 hours.  Some gas.  Mild cramping or bloating in your belly. Follow these instructions at home: General instructions  For the first 24 hours after the procedure: ? Do not drive or use machinery. ? Do not sign important documents. ? Do not drink alcohol. ? Do your daily activities more slowly than normal. ? Eat foods that are soft and easy to digest.  Take over-the-counter or prescription medicines only as told by your doctor. To help cramping and bloating:   Try walking around.  Put heat on your belly (abdomen) as told by your doctor. Use a heat source that your doctor recommends, such as a moist heat pack or a heating pad. ? Put a towel between your skin and the heat source. ? Leave the heat on for 20-30 minutes. ? Remove the heat if your skin turns bright red. This is especially important if you cannot feel pain, heat, or cold. You can get burned. Eating and drinking   Drink enough fluid to keep your pee (urine) clear or pale yellow.  Return to your normal diet as told by your doctor. Avoid heavy or fried foods that are hard to digest.  Avoid drinking alcohol for as long as told by your doctor. Contact a doctor if:  You have blood in your poop (stool) 2-3 days after the procedure. Get help right away if:  You have more than a small amount of blood in your poop.  You see large clumps of tissue (blood clots) in your poop.  Your belly is swollen.  You feel sick to your stomach  (nauseous).  You throw up (vomit).  You have a fever.  You have belly pain that gets worse, and medicine does not help your pain. Summary  After the procedure, it is common to have a small amount of blood in your poop. You may also have mild cramping and bloating in your belly.  For the first 24 hours after the procedure, do not drive or use machinery, do not sign important documents, and do not drink alcohol.  Get help right away if you have a lot of blood in your poop, feel sick to your stomach, have a fever, or have more belly pain. This information is not intended to replace advice given to you by your health care provider. Make sure you discuss any questions you have with your health care provider. Document Released: 09/29/2010 Document Revised: 06/27/2017 Document Reviewed: 05/21/2016 Elsevier Patient Education  2020 [employer].

## 2019-03-26 NOTE — H&P (Signed)
[name] Boyle is an 68 y.o. male.   Chief Complaint: Patient is here for colonoscopy. HPI: Patient is 68 year old male who had sessile serrated polyp removed 3 years ago.  He is returning for surveillance colonoscopy.  He denies abdominal pain change in bowel habits or rectal bleeding. Last aspirin dose was 3 days ago. Family history is negative for CRC.  Past Medical History:  Diagnosis Date  . Diabetes mellitus without complication ([facility])   . GERD (gastroesophageal reflux disease)   . High cholesterol   . Hypertension     Past Surgical History:  Procedure Laterality Date  . ESOPHAGOGASTRODUODENOSCOPY N/A 08/28/2018   Procedure: ESOPHAGOGASTRODUODENOSCOPY (EGD);  Surgeon: Rogene Houston, MD;  [facility];  Service: Endoscopy;  Laterality: N/A;  2:00  . [facility]  [facility]   [facility]   [facility]EE [facility]    . LAPAROTOMY N/A 07/21/2018   Procedure: EXPLORATORY LAPAROTOMY, SMALL BOWEL RESECTION;  Surgeon: Virl Cagey, MD;  Location: [facility];  Service: General;  Laterality: N/A;  . LYSIS OF ADHESION N/A 07/21/2018   Procedure: LYSIS OF ADHESION;  Surgeon: Virl Cagey, MD;  Location: [facility];  Service: General;  Laterality: N/A;    Family History  Problem Relation Age of Onset  . Diabetes Father   . Diabetes Maternal Grandfather    Social History:  reports that he has never smoked. He has never used smokeless tobacco. He reports current alcohol use. He reports that he does not use drugs.  Allergies: No Known Allergies  Medications Prior to Admission  Medication Sig Dispense Refill  . amLODipine (NORVASC) 10 MG tablet Take 10 mg by mouth every morning.     . Ascorbic Acid (VITAMIN C) 1000 MG tablet Take 1,000 mg by mouth every morning.     Marland Kitchen aspirin EC 81 MG tablet Take 81 mg by mouth daily.    Marland Kitchen atorvastatin (LIPITOR) 40 MG tablet Take 40 mg by mouth every evening.     Marland Kitchen azelastine (ASTELIN) 0.1 % nasal spray Place 1  spray into both nostrils every morning. Use in each nostril as directed-May use in the evening as needed    . B Complex-C (SUPER B COMPLEX PO) Take 1 tablet by mouth daily.    . Cholecalciferol (VITAMIN D-3) 125 MCG (5000 UT) TABS Take 5,000 Units by mouth daily.    . Coenzyme Q10 300 MG CAPS Take 300 mg by mouth daily.    Marland Kitchen dexlansoprazole (DEXILANT) 60 MG capsule Take 1 capsule (60 mg total) by mouth daily. 90 capsule 3  . enalapril (VASOTEC) 10 MG tablet Take 10 mg by mouth 2 (two) times daily.    . famotidine (PEPCID) 20 MG tablet Take 1 tablet (20 mg total) by mouth at bedtime.    . fexofenadine (HM FEXOFENADINE HCL) 180 MG tablet Take 180 mg by mouth every morning.     . fluticasone (FLONASE) 50 MCG/ACT nasal spray Place 2 sprays into both nostrils every morning.     . folic acid (FOLVITE) 536 MCG tablet Take 400 mcg by mouth daily.    . Glucosamine HCl 1500 MG TABS Take 1,500 mg by mouth daily.    . Multiple Vitamin (MULTIVITAMIN WITH MINERALS) TABS tablet Take 1 tablet by mouth daily.    . naphazoline-pheniramine (ALLERGY EYE) 0.025-0.3 % ophthalmic solution Place 1 drop into both eyes daily.    . Omega-3 Fatty Acids (FISH OIL ULTRA) 1400 MG CAPS Take 1,400 mg by  mouth daily.    Bertram Gala. Polyethyl Glycol-Propyl Glycol (LUBRICANT EYE DROPS) 0.4-0.3 % SOLN Place 1 drop into both eyes daily.    . sennosides-docusate sodium (SENOKOT-S) 8.6-50 MG tablet Take 2 tablets by mouth at bedtime.    . triamcinolone cream (KENALOG) 0.1 % Apply 1 application topically 2 (two) times daily as needed for itching.  3  . metFORMIN (GLUCOPHAGE) 500 MG tablet Take 500 mg by mouth every evening.     . pantoprazole (PROTONIX) 40 MG tablet TAKE 1 TABLET (40 MG TOTAL) BY MOUTH 2 (TWO) TIMES DAILY BEFORE A MEAL. (Patient not taking: Reported on 03/18/2019) 180 tablet 3    Results for orders placed or performed during the hospital encounter of 03/26/19 (from the past 48 hour(s))  Glucose, capillary     Status: None    Collection Time: 03/26/19 11:07 AM  Result Value Ref Range   Glucose-Capillary 94 70 - 99 mg/dL   No results found.  ROS  Blood pressure 137/78, temperature 98.8 F (37.1 C), temperature source Oral, resp. rate 16, height 5\' 5"  (1.651 m), weight 79.4 kg, SpO2 99 %. Physical Exam  Constitutional: He appears well-developed and well-nourished.  HENT:  Mouth/Throat: Oropharynx is clear and moist.  Eyes: Conjunctivae are normal. No scleral icterus.  Neck: No thyromegaly present.  Cardiovascular: Normal rate, regular rhythm and normal heart sounds.  No murmur heard. Respiratory: Effort normal and breath sounds normal.  GI:  Midline scar noted.  Abdomen is soft and nontender with organomegaly or masses.  Musculoskeletal:        General: No edema.  Lymphadenopathy:    He has no cervical adenopathy.  Neurological: He is alert.  Skin: Skin is warm and dry.     Assessment/Plan History of colonic polyp. Surveillance colonoscopy.  [name], MD 03/26/2019, 12:31 PM

## 2019-04-01 ENCOUNTER — Encounter (HOSPITAL_COMMUNITY): Payer: Self-pay | Admitting: Internal Medicine

## 2019-10-17 ENCOUNTER — Ambulatory Visit: Payer: 59 | Attending: Internal Medicine

## 2019-10-17 DIAGNOSIS — Z23 Encounter for immunization: Secondary | ICD-10-CM | POA: Insufficient documentation

## 2019-10-17 NOTE — Progress Notes (Signed)
   Covid-19 Vaccination Clinic  Name:  Birdie Beveridge    MRN: [id number] DOB: [birth year]  10/17/2019  Mr. Luchsinger was observed post Covid-19 immunization for 15 minutes without incidence. He was provided with Vaccine Information Sheet and instruction to access the V-Safe system.   Mr. Wiltsey was instructed to call 911 with any severe reactions post vaccine: Marland Kitchen Difficulty breathing  . Swelling of your face and throat  . A fast heartbeat  . A bad rash all over your body  . Dizziness and weakness    Immunizations Administered    Name Date Dose VIS Date Route   Pfizer COVID-19 Vaccine 10/17/2019  3:55 PM 0.3 mL 08/21/2019 Intramuscular   Manufacturer: [employer], [employer]   Lot: [id number]   NDC: [address]-0

## 2019-11-04 ENCOUNTER — Ambulatory Visit: Payer: 59

## 2019-11-11 ENCOUNTER — Ambulatory Visit: Payer: 59 | Attending: Internal Medicine

## 2019-11-11 DIAGNOSIS — Z23 Encounter for immunization: Secondary | ICD-10-CM | POA: Insufficient documentation

## 2019-11-11 NOTE — Progress Notes (Signed)
   Covid-19 Vaccination Clinic  Name:  [name] Boyle    MRN: [id number] DOB: 08-25-1951  11/11/2019  [name] Boyle was observed post Covid-19 immunization for 15 minutes without incident. He was provided with Vaccine Information Sheet and instruction to access the V-Safe system.   [name] Boyle was instructed to call 911 with any severe reactions post vaccine: Marland Kitchen Difficulty breathing  . Swelling of face and throat  . A fast heartbeat  . A bad rash all over body  . Dizziness and weakness   Immunizations Administered    Name Date Dose VIS Date Route   Pfizer COVID-19 Vaccine 11/11/2019 12:06 PM 0.3 mL 08/21/2019 Intramuscular   Manufacturer: [employer], [employer]   Lot: [id number]   NDC: [address]-0

## 2019-11-25 ENCOUNTER — Other Ambulatory Visit (INDEPENDENT_AMBULATORY_CARE_PROVIDER_SITE_OTHER): Payer: Self-pay | Admitting: *Deleted

## 2019-11-25 DIAGNOSIS — K2100 Gastro-esophageal reflux disease with esophagitis, without bleeding: Secondary | ICD-10-CM

## 2019-11-25 MED ORDER — DEXILANT 60 MG PO CPDR: 60.0000 mg | DELAYED_RELEASE_CAPSULE | Freq: Every day | ORAL | 3 refills | Status: DC

## 2020-04-13 IMAGING — DX DG ABDOMEN ACUTE W/ 1V CHEST
4 series · 4 of 4 positions shown · non-contrast
Comparison: 07/10/2018, CT 07/08/2018

CLINICAL DATA: Bowel obstruction

EXAM:
DG ABDOMEN ACUTE W/ 1V CHEST

[abdomen erect]
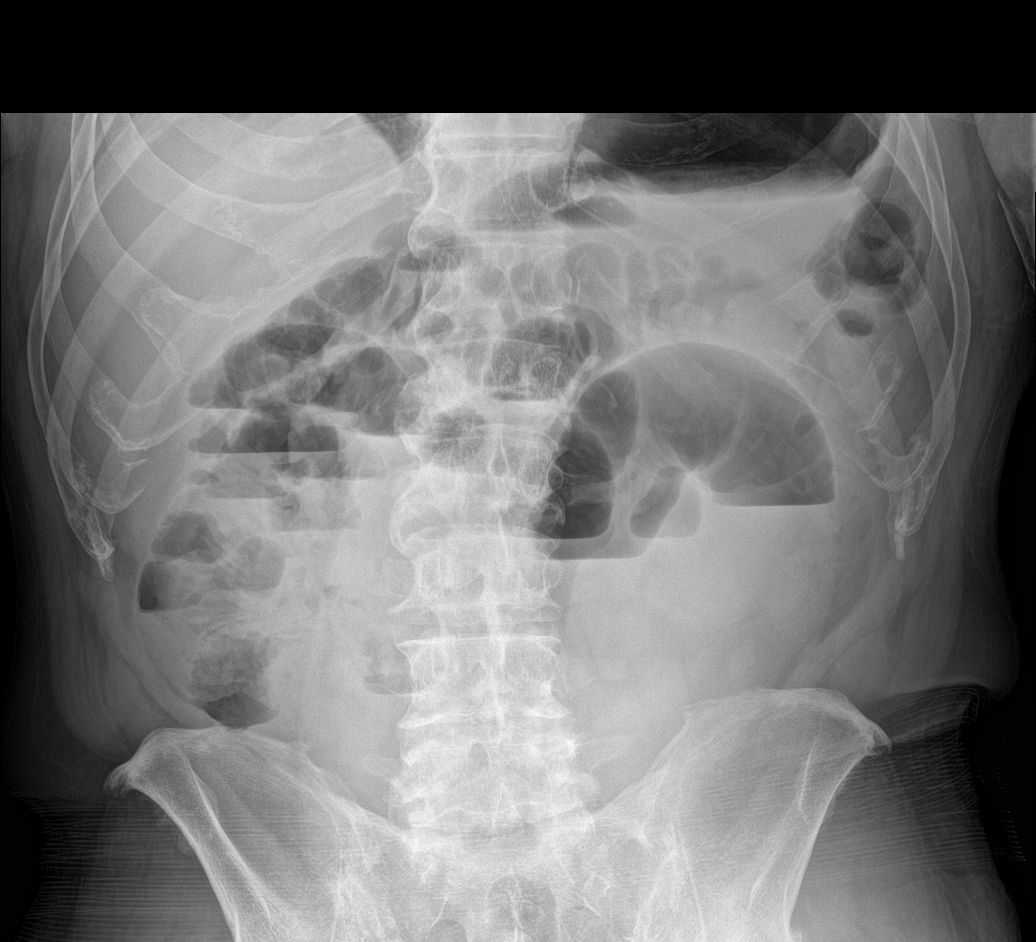

[abdomen supine (1 of 2)]
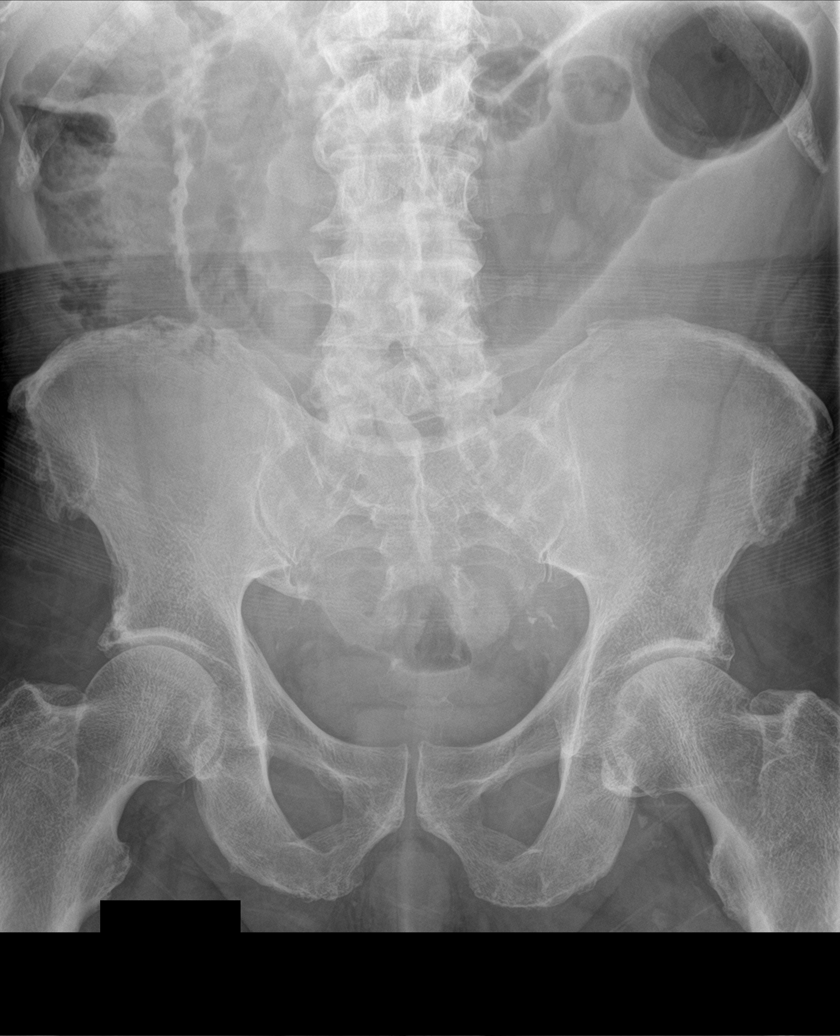

[abdomen supine (2 of 2)]
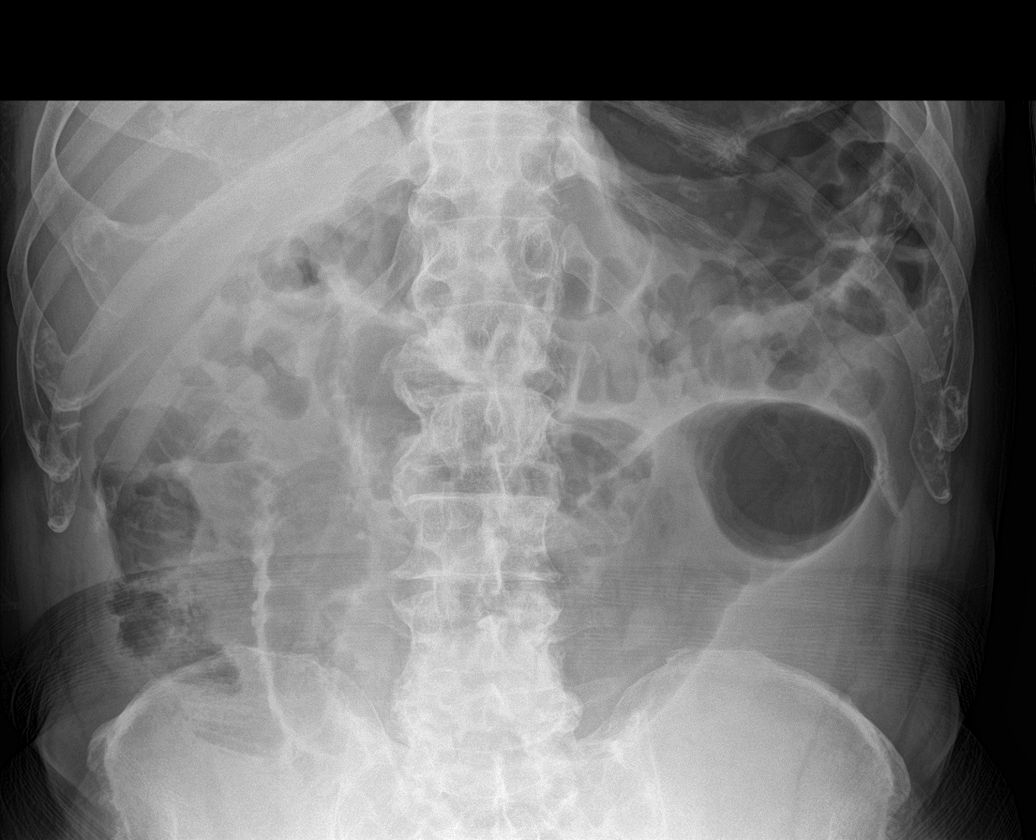

[chest pa]
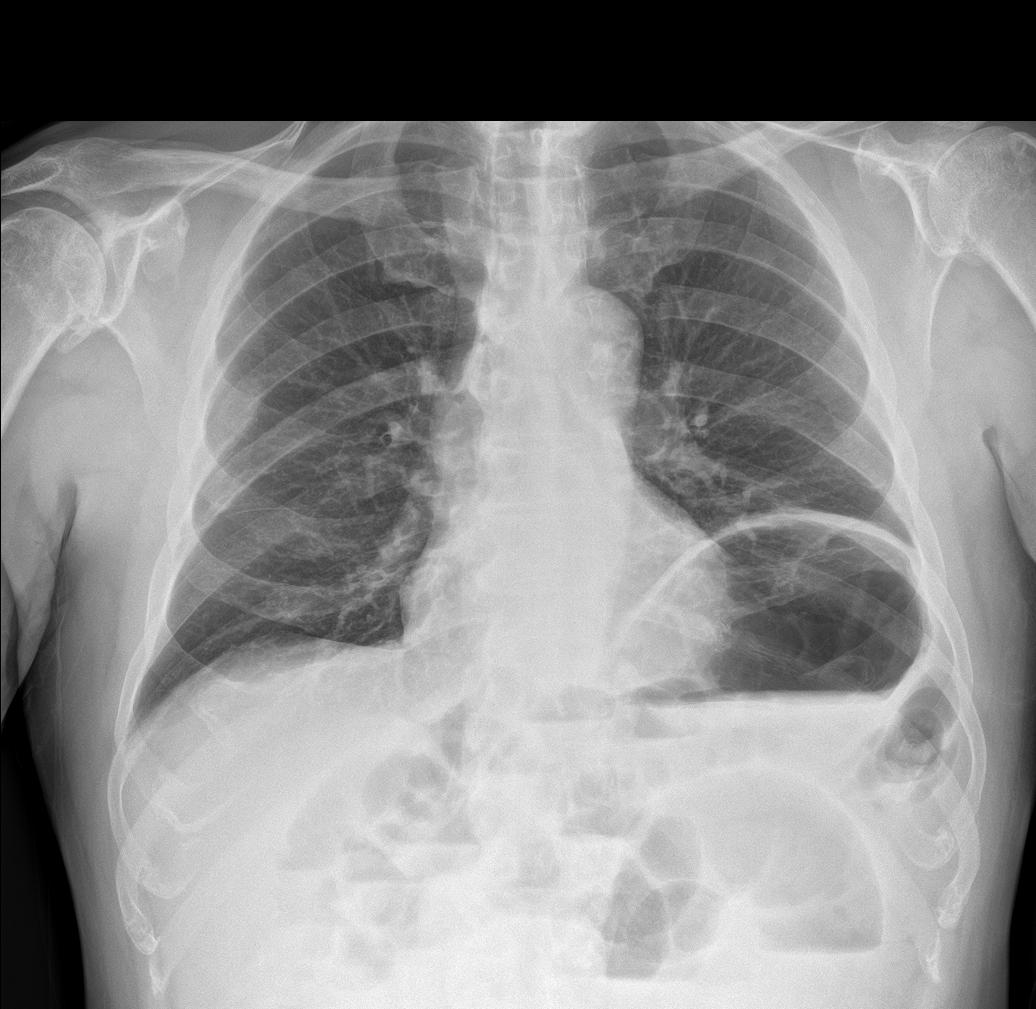

[4 of 4 positions shown; findings below may reference images not displayed]

FINDINGS: Single-view chest demonstrates elevated left diaphragm with
atelectasis at the left base. No pleural effusion. Stable
cardiomediastinal silhouette with aortic atherosclerosis.

Upright view abdomen shows no free air beneath the diaphragm.
Interval worsening of small bowel dilatation, measuring up to 7.1 cm
in length with increased fluid levels on upright exam consistent
with ongoing bowel obstruction. Questionable appearance of
intramural air in the right lower quadrant.
IMPRESSION: 1. Slight interval worsening of small bowel obstruction. No free
air.
2. Possible appearance of intramural air in the right lower
quadrant. CT is recommended for further evaluation.

## 2020-11-18 ENCOUNTER — Telehealth (INDEPENDENT_AMBULATORY_CARE_PROVIDER_SITE_OTHER): Payer: Self-pay | Admitting: *Deleted

## 2020-11-18 ENCOUNTER — Other Ambulatory Visit (INDEPENDENT_AMBULATORY_CARE_PROVIDER_SITE_OTHER): Payer: Self-pay | Admitting: Internal Medicine

## 2020-11-18 DIAGNOSIS — K2100 Gastro-esophageal reflux disease with esophagitis, without bleeding: Secondary | ICD-10-CM

## 2020-11-18 NOTE — Telephone Encounter (Signed)
Patient was last office visit was in 2020. He will need appointment within the next 3 months before he may get future refills

## 2020-11-22 ENCOUNTER — Other Ambulatory Visit (INDEPENDENT_AMBULATORY_CARE_PROVIDER_SITE_OTHER): Payer: Self-pay | Admitting: Internal Medicine

## 2020-11-22 DIAGNOSIS — K2100 Gastro-esophageal reflux disease with esophagitis, without bleeding: Secondary | ICD-10-CM

## 2020-11-23 ENCOUNTER — Other Ambulatory Visit (INDEPENDENT_AMBULATORY_CARE_PROVIDER_SITE_OTHER): Payer: Self-pay | Admitting: Internal Medicine

## 2020-11-23 DIAGNOSIS — K2100 Gastro-esophageal reflux disease with esophagitis, without bleeding: Secondary | ICD-10-CM

## 2020-11-23 NOTE — Telephone Encounter (Signed)
Did PA on patient on cover my meds. 11/23/2020

## 2021-02-09 ENCOUNTER — Ambulatory Visit (INDEPENDENT_AMBULATORY_CARE_PROVIDER_SITE_OTHER): Payer: 59 | Admitting: Gastroenterology

## 2021-02-09 ENCOUNTER — Encounter (INDEPENDENT_AMBULATORY_CARE_PROVIDER_SITE_OTHER): Payer: Self-pay | Admitting: Gastroenterology

## 2021-02-09 ENCOUNTER — Other Ambulatory Visit: Payer: Self-pay

## 2021-02-09 VITALS — BP 171/79 | HR 76 | Temp 99.0°F | Ht 65.0 in | Wt 186.0 lb

## 2021-02-09 DIAGNOSIS — K219 Gastro-esophageal reflux disease without esophagitis: Secondary | ICD-10-CM

## 2021-02-09 MED ORDER — OMEPRAZOLE 40 MG PO CPDR: 40.0000 mg | DELAYED_RELEASE_CAPSULE | Freq: Two times a day (BID) | ORAL | 3 refills | Status: DC

## 2021-02-09 NOTE — Progress Notes (Signed)
Katrinka Blazing, M.D. Gastroenterology & Hepatology [facility] [address] [place], [place] [address]  Primary Care Physician: Pearson Grippe, MD [address] [place] [address]  I will communicate my assessment and recommendations to the referring MD via EMR.  Problems: 1. GERD  History of Present Illness: [name] Boyle is a 70 y.o. male with past medical history of diabetes, GERD, hyperlipidemia and hypertension, who presents for follow up of GERD.  The patient was last seen on 02/16/2019. At that time, the patient was continued on Dexilant 60 mg every day as well as Pepcid 40 mg every night.  Patient reports that between mid 2020 to 2021 he was taking Dexilant 60 mg and famotidine 20 mg qday, which completely controls his GERD episodes.  Unfortunately, the patient states that Dexilant the discount program of is no longer running and he could not afford his medication anymore.Due to this he ran out of his Dexilant in April 2022. He was advised by his insurance to try alternatives.  Due to this he is now taking omeprazole 40 mg twice a day.  He was previously taking pantoprazole 40 mg twice a day but it was not working.  He reports that while taking the omeprazole he has not presented any symptoms.  He has never tried taking it once a day but he states that during the evening he usually starts feeling some regurgitation that completely resolves after he takes his second dose of omeprazole.  With this regimen he denies having any breakthrough symptoms of heartburn or regurgitation.  The patient denies having any dysphagia, odynophagia, nausea, vomiting, fever, chills, hematochezia, melena, hematemesis, abdominal distention, abdominal pain, diarrhea, jaundice, pruritus or weight loss.  Last EGD: 2019  - Normal esophagus. - Z-line irregular, 40 cm from the incisors. - 2 cm hiatal hernia. - Normal stomach. - Normal duodenal bulb  and second portion of the duodenum. Last Colonoscopy: 2020 - normal  Past Medical History: Past Medical History:  Diagnosis Date  . Diabetes mellitus without complication ([facility])   . GERD (gastroesophageal reflux disease)   . High cholesterol   . Hypertension     Past Surgical History: Past Surgical History:  Procedure Laterality Date  . COLONOSCOPY N/A 03/26/2019   Procedure: COLONOSCOPY;  Surgeon: Malissa Hippo, MD;  [facility];  Service: Endoscopy;  Laterality: N/A;  12:00  . ESOPHAGOGASTRODUODENOSCOPY N/A 08/28/2018   Procedure: ESOPHAGOGASTRODUODENOSCOPY (EGD);  Surgeon: Malissa Hippo, MD;  [facility];  Service: Endoscopy;  Laterality: N/A;  2:00  . [facility]  [facility]   [facility]   . K[facility]EE [facility]GERY    . LAPAROTOMY N/A 07/21/2018   Procedure: EXPLORATORY LAPAROTOMY, SMALL BOWEL RESECTION;  Surgeon: [name], MD;  Location: [facility];  Service: General;  Laterality: N/A;  . LYSIS OF ADHESION N/A 07/21/2018   Procedure: LYSIS OF ADHESION;  Surgeon: [name], MD;  Location: [facility];  Service: General;  Laterality: N/A;    Family History: Family History  Problem Relation Age of Onset  . Diabetes Father   . Diabetes Maternal Grandfather     Social History: Social History   Tobacco Use  Smoking Status Never Smoker  Smokeless Tobacco Never Used   Social History   Substance and Sexual Activity  Alcohol Use Yes   Comment: moderate   Social History   Substance and Sexual Activity  Drug Use Never  Allergies: No Known Allergies  Medications: Current Outpatient Medications  Medication Sig Dispense Refill  . amLODipine (NORVASC) 10 MG tablet Take 10 mg by mouth every morning.     . Ascorbic Acid (VITAMIN C) 1000 MG tablet Take 1,000 mg by mouth every morning.     Marland Kitchen aspirin EC 81 MG tablet Take 81 mg by mouth daily.    Marland Kitchen atorvastatin (LIPITOR) 40 MG tablet Take 40 mg by mouth every  evening.     Marland Kitchen azelastine (ASTELIN) 0.1 % nasal spray Place 1 spray into both nostrils every morning. Use in each nostril as directed-May use in the evening as needed    . B Complex-C (SUPER B COMPLEX PO) Take 1 tablet by mouth daily.    . betamethasone dipropionate 0.05 % cream Apply topically 2 (two) times daily.    . Cholecalciferol (VITAMIN D-3) 125 MCG (5000 UT) TABS Take 1,000 Units by mouth daily.    . Coenzyme Q10 300 MG CAPS Take 600 mg by mouth daily.    Marland Kitchen docusate sodium (COLACE) 50 MG capsule Take 50 mg by mouth 2 (two) times daily.    . enalapril (VASOTEC) 10 MG tablet Take 10 mg by mouth 2 (two) times daily.    . famotidine (PEPCID) 20 MG tablet Take 1 tablet (20 mg total) by mouth at bedtime.    . fexofenadine (ALLEGRA) 180 MG tablet Take 180 mg by mouth every morning.     . fluticasone (CUTIVATE) 0.05 % cream Apply topically 2 (two) times daily.    . fluticasone (FLONASE) 50 MCG/ACT nasal spray Place 2 sprays into both nostrils every morning.     . folic acid (FOLVITE) 800 MCG tablet Take 400 mcg by mouth daily.    . Glucosamine HCl 1500 MG TABS Take 1,500 mg by mouth daily.    Marland Kitchen levothyroxine (SYNTHROID) 100 MCG tablet Take 20 mcg by mouth daily before breakfast.    . meloxicam (MOBIC) 15 MG tablet Take 15 mg by mouth as needed for pain.    . metFORMIN (GLUCOPHAGE) 500 MG tablet Take 500 mg by mouth every evening.     . Multiple Vitamin (MULTIVITAMIN WITH MINERALS) TABS tablet Take 1 tablet by mouth daily.    . naphazoline-pheniramine (NAPHCON-A) 0.025-0.3 % ophthalmic solution Place 1 drop into both eyes daily.    . Omega-3 Fatty Acids (FISH OIL ULTRA) 1400 MG CAPS Take 1,400 mg by mouth daily.    Bertram Gala Glycol-Propyl Glycol 0.4-0.3 % SOLN Place 1 drop into both eyes daily.    Marland Kitchen triamcinolone cream (KENALOG) 0.1 % Apply 1 application topically 2 (two) times daily as needed for itching.  3  . Turmeric (QC TUMERIC COMPLEX PO) Take 3,000 mg by mouth daily at 6 (six) AM.      No current facility-administered medications for this visit.    Review of Systems: GENERAL: negative for malaise, night sweats HEENT: No changes in hearing or vision, no nose bleeds or other nasal problems. NECK: Negative for lumps, goiter, pain and significant neck swelling RESPIRATORY: Negative for cough, wheezing CARDIOVASCULAR: Negative for chest pain, leg swelling, palpitations, orthopnea GI: SEE HPI MUSCULOSKELETAL: Negative for joint pain or swelling, back pain, and muscle pain. SKIN: Negative for lesions, rash PSYCH: Negative for sleep disturbance, mood disorder and recent psychosocial stressors. HEMATOLOGY Negative for prolonged bleeding, bruising easily, and swollen nodes. ENDOCRINE: Negative for cold or heat intolerance, polyuria, polydipsia and goiter. NEURO: negative for tremor, gait imbalance, syncope and seizures. The remainder  of the review of systems is noncontributory.   Physical Exam: BP (!) 171/79 (BP Location: Right Arm, Patient Position: Sitting, Cuff Size: Large)   Pulse 76   Temp 99 F (37.2 C) (Oral)   Ht 5\' 5"  (1.651 m)   Wt 186 lb (84.4 kg)   BMI 30.95 kg/m  GENERAL: The patient is AO x3, in no acute distress. HEENT: Head is normocephalic and atraumatic. EOMI are intact. Mouth is well hydrated and without lesions. NECK: Supple. No masses LUNGS: Clear to auscultation. No presence of rhonchi/wheezing/rales. Adequate chest expansion HEART: RRR, normal s1 and s2. ABDOMEN: Soft, nontender, no guarding, no peritoneal signs, and nondistended. BS +. No masses. EXTREMITIES: Without any cyanosis, clubbing, rash, lesions or edema. NEUROLOGIC: AOx3, no focal motor deficit. SKIN: no jaundice, no rashes  Imaging/Labs: as above  I personally reviewed and interpreted the available labs, imaging and endoscopic files.  Impression and Plan: [name] Boyle is a 70 y.o. male with past medical history of diabetes, GERD, hyperlipidemia and hypertension, who presents  for follow up of GERD.  Patient has presented symptom control while taking the omeprazole 40 mg twice a day.  I think he will be adequate for him to continue taking this medication at this dosage.  I explained to the patient that ideally he should try to decrease the dosage to once a day dosing, which will be discussed again during his next appointment.  The patient understood and agreed.  - Continue omeprazole 40 mg twice a day  All questions were answered.      60, MD Gastroenterology and Hepatology [facility]

## 2021-02-09 NOTE — Patient Instructions (Signed)
Continue omeprazole 40 mg twice a day. ° °

## 2021-03-06 ENCOUNTER — Other Ambulatory Visit (INDEPENDENT_AMBULATORY_CARE_PROVIDER_SITE_OTHER): Payer: Self-pay

## 2021-03-06 DIAGNOSIS — K219 Gastro-esophageal reflux disease without esophagitis: Secondary | ICD-10-CM

## 2021-03-06 MED ORDER — OMEPRAZOLE 40 MG PO CPDR: 40.0000 mg | DELAYED_RELEASE_CAPSULE | Freq: Two times a day (BID) | ORAL | 3 refills | Status: DC

## 2021-12-28 ENCOUNTER — Other Ambulatory Visit (INDEPENDENT_AMBULATORY_CARE_PROVIDER_SITE_OTHER): Payer: Self-pay | Admitting: Gastroenterology

## 2021-12-28 DIAGNOSIS — K219 Gastro-esophageal reflux disease without esophagitis: Secondary | ICD-10-CM

## 2022-02-12 ENCOUNTER — Encounter (INDEPENDENT_AMBULATORY_CARE_PROVIDER_SITE_OTHER): Payer: Self-pay | Admitting: Gastroenterology

## 2022-02-12 ENCOUNTER — Ambulatory Visit (INDEPENDENT_AMBULATORY_CARE_PROVIDER_SITE_OTHER): Payer: 59 | Admitting: Gastroenterology

## 2022-02-12 ENCOUNTER — Encounter (INDEPENDENT_AMBULATORY_CARE_PROVIDER_SITE_OTHER): Payer: Self-pay

## 2022-02-12 ENCOUNTER — Other Ambulatory Visit (INDEPENDENT_AMBULATORY_CARE_PROVIDER_SITE_OTHER): Payer: Self-pay

## 2022-02-12 VITALS — BP 134/69 | HR 62 | Temp 98.7°F | Ht 65.0 in | Wt 183.5 lb

## 2022-02-12 DIAGNOSIS — K219 Gastro-esophageal reflux disease without esophagitis: Secondary | ICD-10-CM | POA: Diagnosis not present

## 2022-02-12 NOTE — Progress Notes (Signed)
Katrinka Blazing, M.D. Gastroenterology & Hepatology [facility] [address] [place], [place] [address]  Primary Care Physician: Pearson Grippe, MD [address] [place] [address]  I will communicate my assessment and recommendations to the referring MD via EMR.  Problems: GERD Possible regurgitation  History of Present Illness: [name] Boyle is a 71 y.o. male  with past medical history of diabetes, GERD, hyperlipidemia and hypertension, who presents for follow up of GERD.  The patient was last seen on 02/09/21. At that time, the patient was advised to continue taking omeprazole 40 mg twice a day.  Patient states that he has kept taking omeprazole 40 mg twice a day but he is also taking famotidine 20 mg BID as he had some breakthrough episodes of regurgitation while only taking a PPI. States that as long as he takes this combination, he is able to toelrate his symptoms. States there is regurgitation of contents to his throat if he does not take these medicines. The famotidine helps decreasing the sensation of increased contents coming to this throat.  He denies having any frequent heartburn or sour taste in his mouth.  The patient denies having any nausea, vomiting, fever, chills, hematochezia, melena, hematemesis, abdominal distention, abdominal pain, diarrhea, jaundice, pruritus or weight loss.  Last EGD: 2019 - Normal esophagus. - Z-line irregular, 40 cm from the incisors. - 2 cm hiatal hernia. - Normal stomach. - Normal duodenal bulb and second portion of the duodenum.  Last Colonoscopy: 2020 The entire examined colon is normal. - The distal rectum and anal verge are normal on retroflexion view. - No specimens collected.   Past Medical History: Past Medical History:  Diagnosis Date   Diabetes mellitus without complication (HCC)    GERD (gastroesophageal reflux disease)    High cholesterol    Hypertension      Past Surgical History: Past Surgical History:  Procedure Laterality Date   COLONOSCOPY N/A 03/26/2019   Procedure: COLONOSCOPY;  Surgeon: Malissa Hippo, MD;  Location: [facility];  Service: Endoscopy;  Laterality: N/A;  12:00   ESOPHAGOGASTRODUODENOSCOPY N/A 08/28/2018   Procedure: ESOPHAGOGASTRODUODENOSCOPY (EGD);  Surgeon: Malissa Hippo, MD;  Location: [facility];  Service: Endoscopy;  Laterality: N/A;  2:00   EXPLORATORY LAPAROTOMY  07/2017   ? partial malrotation- [facility]    KNEE SURGERY     LAPAROTOMY N/A 07/21/2018   Procedure: EXPLORATORY LAPAROTOMY, SMALL BOWEL RESECTION;  Surgeon: [name], MD;  Location: [facility] ORS;  Service: General;  Laterality: N/A;   LYSIS OF ADHESION N/A 07/21/2018   Procedure: LYSIS OF ADHESION;  Surgeon: [name], MD;  Location: [facility] ORS;  Service: General;  Laterality: N/A;    Family History: Family History  Problem Relation Age of Onset   Diabetes Father    Diabetes Maternal Grandfather     Social History: Social History   Tobacco Use  Smoking Status Never  Smokeless Tobacco Never   Social History   Substance and Sexual Activity  Alcohol Use Yes   Comment: moderate   Social History   Substance and Sexual Activity  Drug Use Never    Allergies: No Known Allergies  Medications: Current Outpatient Medications  Medication Sig Dispense Refill   amLODipine (NORVASC) 10 MG tablet Take 10 mg by mouth every morning.      Ascorbic Acid (VITAMIN C) 1000 MG tablet Take 1,000 mg by mouth every morning.      aspirin  EC 81 MG tablet Take 81 mg by mouth daily.     atorvastatin (LIPITOR) 40 MG tablet Take 40 mg by mouth every evening.      azelastine (ASTELIN) 0.1 % nasal spray Place 1 spray into both nostrils every morning. Use in each nostril as directed-May use in the evening as needed     B Complex-C (SUPER B COMPLEX PO) Take 1 tablet by mouth daily.     chlorthalidone (HYGROTON) 25 MG tablet Take 25 mg by  mouth daily.     Cholecalciferol (VITAMIN D-3) 125 MCG (5000 UT) TABS Take 1,000 Units by mouth daily.     clobetasol cream (TEMOVATE) 0.05 % Apply 1 application. topically as needed. 2-3 times per week.     Coenzyme Q10 300 MG CAPS Take 600 mg by mouth daily.     docusate sodium (COLACE) 50 MG capsule Take 50 mg by mouth 2 (two) times daily.     enalapril (VASOTEC) 20 MG tablet Take 20 mg by mouth 2 (two) times daily.     famotidine (PEPCID) 20 MG tablet Take 1 tablet (20 mg total) by mouth at bedtime. (Patient taking differently: Take 20 mg by mouth 2 (two) times daily.)     fexofenadine (ALLEGRA) 180 MG tablet Take 180 mg by mouth every morning.      fluticasone (CUTIVATE) 0.05 % cream Apply topically 2 (two) times daily.     fluticasone (FLONASE) 50 MCG/ACT nasal spray Place 1 spray into both nostrils daily.     folic acid (FOLVITE) 800 MCG tablet Take 400 mcg by mouth daily. 1333 mcg daily     Glucosamine HCl 1500 MG TABS Take 1,500 mg by mouth daily.     ketoconazole (NIZORAL) 2 % cream Apply 1 application. topically as needed for irritation. 2-3 times per week.     levothyroxine (SYNTHROID) 50 MCG tablet Take 50 mcg by mouth daily before breakfast.     meloxicam (MOBIC) 15 MG tablet Take 15 mg by mouth as needed for pain.     metFORMIN (GLUCOPHAGE) 500 MG tablet Take 500 mg by mouth every evening.      Multiple Vitamin (MULTIVITAMIN WITH MINERALS) TABS tablet Take 1 tablet by mouth daily.     naphazoline-pheniramine (NAPHCON-A) 0.025-0.3 % ophthalmic solution Place 1 drop into both eyes daily.     Omega-3 Fatty Acids (FISH OIL ULTRA) 1400 MG CAPS Take 1,400 mg by mouth daily.     omeprazole (PRILOSEC) 40 MG capsule TAKE 1 CAPSULE BY MOUTH  TWICE DAILY 180 capsule 3   Polyethyl Glycol-Propyl Glycol 0.4-0.3 % SOLN Place 1 drop into both eyes daily.     Turmeric (QC TUMERIC COMPLEX PO) Take 3,000 mg by mouth daily at 6 (six) AM.     No current facility-administered medications for this  visit.    Review of Systems: GENERAL: negative for malaise, night sweats HEENT: No changes in hearing or vision, no nose bleeds or other nasal problems. NECK: Negative for lumps, goiter, pain and significant neck swelling RESPIRATORY: Negative for cough, wheezing CARDIOVASCULAR: Negative for chest pain, leg swelling, palpitations, orthopnea GI: SEE HPI MUSCULOSKELETAL: Negative for joint pain or swelling, back pain, and muscle pain. SKIN: Negative for lesions, rash PSYCH: Negative for sleep disturbance, mood disorder and recent psychosocial stressors. HEMATOLOGY Negative for prolonged bleeding, bruising easily, and swollen nodes. ENDOCRINE: Negative for cold or heat intolerance, polyuria, polydipsia and goiter. NEURO: negative for tremor, gait imbalance, syncope and seizures. The remainder of the review  of systems is noncontributory.   Physical Exam: BP 134/69 (BP Location: Left Arm, Patient Position: Sitting, Cuff Size: Large)   Pulse 62   Temp 98.7 F (37.1 C) (Oral)   Ht 5\' 5"  (1.651 m)   Wt 183 lb 8 oz (83.2 kg)   BMI 30.54 kg/m  GENERAL: The patient is AO x3, in no acute distress. HEENT: Head is normocephalic and atraumatic. EOMI are intact. Mouth is well hydrated and without lesions. NECK: Supple. No masses LUNGS: Clear to auscultation. No presence of rhonchi/wheezing/rales. Adequate chest expansion HEART: RRR, normal s1 and s2. ABDOMEN: Soft, nontender, no guarding, no peritoneal signs, and nondistended. BS +. No masses. EXTREMITIES: Without any cyanosis, clubbing, rash, lesions or edema. NEUROLOGIC: AOx3, no focal motor deficit. SKIN: no jaundice, no rashes  Imaging/Labs: as above  I personally reviewed and interpreted the available labs, imaging and endoscopic files.  Impression and Plan: [name] Boyle is a 71 y.o. male  with past medical history of diabetes, GERD, hyperlipidemia and hypertension, who presents for follow up of GERD.  Patient has presented recurrent  episodes of regurgitation, unclear if these episodes are related to reflux of acidic or nonacidic contents, or are related to functional symptoms.  I had a thorough discussion with the patient regarding the fact that he is taking high-dose PPI and anti-H2 medication which are not usually recommended to use in combination.  Due to the fact that he has presented recurrent symptoms, will need to proceed with an EGD to further evaluate his symptoms, especially as he is taking acid suppressive medication at very high doses.  We also had a thorough discussion about the fact that if his symptoms are related to persistent reflux, this may need to have further evaluation with a pH impedance testing on PPI in the future as he may benefit from implementing nonpharmacologic treatments such as TIF or Nissen fundoplication.  Patient understood and will consider this in the future.  - Continue omeprazole 40 mg BID - Can take famotidine as needed for now - Schedule EGD - May proceed with manometry/pH impedance testing depending on EGD findings  All questions were answered.      Dolores Frameaniel Castaneda Mayorga, MD Gastroenterology and Hepatology [facility]

## 2022-02-12 NOTE — Patient Instructions (Signed)
Continue omeprazole 40 mg BID Can take famotidine as needed for now Schedule EGD May proceed with manometry/pH impedance testing depending on EGD findings

## 2022-02-13 ENCOUNTER — Encounter (INDEPENDENT_AMBULATORY_CARE_PROVIDER_SITE_OTHER): Payer: Self-pay

## 2022-03-14 NOTE — Patient Instructions (Signed)
[name] Boyle  03/14/2022     @PREFPERIOPPHARMACY @   Your procedure is scheduled on  03/20/2022.   Report to 05/21/2022 at  1130  A.M.   Call this number if you have problems the morning of surgery:  [phone]   Remember:  Follow the diet and prep instructions given to you by the office.    DO NOT take any medications for diabetes the morning of your procedure.     Take these medicines the morning of surgery with A SIP OF WATER         amlodipine, vasotec, pepcid, allegra, levothyroxine, mobic (if needed), prilosec.     Do not wear jewelry, make-up or nail polish.  Do not wear lotions, powders, or perfumes, or deodorant.  Do not shave 48 hours prior to surgery.  Men may shave face and neck.  Do not bring valuables to the hospital.  [facility] is not responsible for any belongings or valuables.  Contacts, dentures or bridgework may not be worn into surgery.  Leave your suitcase in the car.  After surgery it may be brought to your room.  For patients admitted to the hospital, discharge time will be determined by your treatment team.  Patients discharged the day of surgery will not be allowed to drive home and must  have someone with them for 24 hours.    Special instructions:   DO NOT smoke tobacco or vape for 24 hours before your procedure.  Please read over the following fact sheets that you were given. Anesthesia Post-op Instructions and Care and Recovery After Surgery      Upper Endoscopy, Adult, Care After This sheet gives you information about how to care for yourself after your procedure. Your health care provider may also give you more specific instructions. If you have problems or questions, contact your health care provider. What can I expect after the procedure? After the procedure, it is common to have: A sore throat. Mild stomach pain or discomfort. Bloating. Nausea. Follow these instructions at home:  Follow instructions from your health  care provider about what to eat or drink after your procedure. Return to your normal activities as told by your health care provider. Ask your health care provider what activities are safe for you. Take over-the-counter and prescription medicines only as told by your health care provider. If you were given a sedative during the procedure, it can affect you for several hours. Do not drive or operate machinery until your health care provider says that it is safe. Keep all follow-up visits as told by your health care provider. This is important. Contact a health care provider if you have: A sore throat that lasts longer than one day. Trouble swallowing. Get help right away if: You vomit blood or your vomit looks like coffee grounds. You have: A fever. Bloody, black, or tarry stools. A severe sore throat or you cannot swallow. Difficulty breathing. Severe pain in your chest or abdomen. Summary After the procedure, it is common to have a sore throat, mild stomach discomfort, bloating, and nausea. If you were given a sedative during the procedure, it can affect you for several hours. Do not drive or operate machinery until your health care provider says that it is safe. Follow instructions from your health care provider about what to eat or drink after your procedure. Return to your normal activities as told by your health care provider. This information is not intended to replace  advice given to you by your health care provider. Make sure you discuss any questions you have with your health care provider. Document Revised: 07/03/2019 Document Reviewed: 01/27/2018 Elsevier Patient Education  [place] After This sheet gives you information about how to care for yourself after your procedure. Your health care provider may also give you more specific instructions. If you have problems or questions, contact your health care provider. What can I expect after  the procedure? After the procedure, it is common to have: Tiredness. Forgetfulness about what happened after the procedure. Impaired judgment for important decisions. Nausea or vomiting. Some difficulty with balance. Follow these instructions at home: For the time period you were told by your health care provider:     Rest as needed. Do not participate in activities where you could fall or become injured. Do not drive or use machinery. Do not drink alcohol. Do not take sleeping pills or medicines that cause drowsiness. Do not make important decisions or sign legal documents. Do not take care of children on your own. Eating and drinking Follow the diet that is recommended by your health care provider. Drink enough fluid to keep your urine pale yellow. If you vomit: Drink water, juice, or soup when you can drink without vomiting. Make sure you have little or no nausea before eating solid foods. General instructions Have a responsible adult stay with you for the time you are told. It is important to have someone help care for you until you are awake and alert. Take over-the-counter and prescription medicines only as told by your health care provider. If you have sleep apnea, surgery and certain medicines can increase your risk for breathing problems. Follow instructions from your health care provider about wearing your sleep device: Anytime you are sleeping, including during daytime naps. While taking prescription pain medicines, sleeping medicines, or medicines that make you drowsy. Avoid smoking. Keep all follow-up visits as told by your health care provider. This is important. Contact a health care provider if: You keep feeling nauseous or you keep vomiting. You feel light-headed. You are still sleepy or having trouble with balance after 24 hours. You develop a rash. You have a fever. You have redness or swelling around the IV site. Get help right away if: You have trouble  breathing. You have new-onset confusion at home. Summary For several hours after your procedure, you may feel tired. You may also be forgetful and have poor judgment. Have a responsible adult stay with you for the time you are told. It is important to have someone help care for you until you are awake and alert. Rest as told. Do not drive or operate machinery. Do not drink alcohol or take sleeping pills. Get help right away if you have trouble breathing, or if you suddenly become confused. This information is not intended to replace advice given to you by your health care provider. Make sure you discuss any questions you have with your health care provider. Document Revised: 08/01/2021 Document Reviewed: 07/30/2019 Elsevier Patient Education  [place].

## 2022-03-15 ENCOUNTER — Encounter (HOSPITAL_COMMUNITY): Payer: Self-pay

## 2022-03-15 ENCOUNTER — Encounter (HOSPITAL_COMMUNITY)
Admission: RE | Admit: 2022-03-15 | Discharge: 2022-03-15 | Disposition: A | Discharge status: Home / Self Care | Payer: 59 | Source: Ambulatory Visit | Attending: Gastroenterology | Admitting: Gastroenterology

## 2022-03-15 DIAGNOSIS — Z01818 Encounter for other preprocedural examination: Secondary | ICD-10-CM | POA: Insufficient documentation

## 2022-03-15 DIAGNOSIS — E119 Type 2 diabetes mellitus without complications: Secondary | ICD-10-CM | POA: Insufficient documentation

## 2022-03-15 DIAGNOSIS — I10 Essential (primary) hypertension: Secondary | ICD-10-CM | POA: Insufficient documentation

## 2022-03-15 LAB — BASIC METABOLIC PANEL
Anion gap: 8 (ref 5–15)
BUN: 31 mg/dL — ABNORMAL HIGH (ref 8–23)
CO2: 23 mmol/L (ref 22–32)
Calcium: 9.2 mg/dL (ref 8.9–10.3)
Chloride: 102 mmol/L (ref 98–111)
Creatinine, Ser: 1.59 mg/dL — ABNORMAL HIGH (ref 0.61–1.24)
GFR, Estimated: 46 mL/min — ABNORMAL LOW (ref >60)
Glucose, Bld: 98 mg/dL (ref 70–99)
Potassium: 3.6 mmol/L (ref 3.5–5.1)
Sodium: 133 mmol/L — ABNORMAL LOW (ref 135–145)

## 2022-03-20 ENCOUNTER — Ambulatory Visit (HOSPITAL_COMMUNITY): Payer: 59 | Admitting: Anesthesiology

## 2022-03-20 ENCOUNTER — Ambulatory Visit (HOSPITAL_BASED_OUTPATIENT_CLINIC_OR_DEPARTMENT_OTHER): Payer: 59 | Admitting: Anesthesiology

## 2022-03-20 ENCOUNTER — Encounter (HOSPITAL_COMMUNITY)
Admission: RE | Disposition: A | Discharge status: Home / Self Care | Payer: Self-pay | Source: Home / Self Care | Attending: Gastroenterology

## 2022-03-20 ENCOUNTER — Encounter (HOSPITAL_COMMUNITY): Payer: Self-pay | Admitting: Gastroenterology

## 2022-03-20 ENCOUNTER — Ambulatory Visit (HOSPITAL_COMMUNITY)
Admission: RE | Admit: 2022-03-20 | Discharge: 2022-03-20 | Disposition: A | Discharge status: Home / Self Care | Payer: 59 | Attending: Gastroenterology | Admitting: Gastroenterology

## 2022-03-20 DIAGNOSIS — I10 Essential (primary) hypertension: Secondary | ICD-10-CM

## 2022-03-20 DIAGNOSIS — K219 Gastro-esophageal reflux disease without esophagitis: Secondary | ICD-10-CM | POA: Diagnosis present

## 2022-03-20 DIAGNOSIS — Z79899 Other long term (current) drug therapy: Secondary | ICD-10-CM | POA: Insufficient documentation

## 2022-03-20 DIAGNOSIS — Z833 Family history of diabetes mellitus: Secondary | ICD-10-CM | POA: Diagnosis not present

## 2022-03-20 DIAGNOSIS — E785 Hyperlipidemia, unspecified: Secondary | ICD-10-CM | POA: Insufficient documentation

## 2022-03-20 DIAGNOSIS — Z7984 Long term (current) use of oral hypoglycemic drugs: Secondary | ICD-10-CM | POA: Diagnosis not present

## 2022-03-20 DIAGNOSIS — R1110 Vomiting, unspecified: Secondary | ICD-10-CM

## 2022-03-20 DIAGNOSIS — E119 Type 2 diabetes mellitus without complications: Secondary | ICD-10-CM

## 2022-03-20 HISTORY — PX: ESOPHAGOGASTRODUODENOSCOPY (EGD) WITH PROPOFOL: SHX5813

## 2022-03-20 LAB — GLUCOSE, CAPILLARY: Glucose-Capillary: 115 mg/dL — ABNORMAL HIGH (ref 70–99)

## 2022-03-20 SURGERY — ESOPHAGOGASTRODUODENOSCOPY (EGD) WITH PROPOFOL
Anesthesia: General

## 2022-03-20 MED ORDER — LIDOCAINE HCL (CARDIAC) PF 50 MG/5ML IV SOSY
PREFILLED_SYRINGE | INTRAVENOUS | Status: DC | PRN
  Administered 2022-03-20: 100 mg via INTRAVENOUS

## 2022-03-20 MED ORDER — PROPOFOL 10 MG/ML IV BOLUS
INTRAVENOUS | Status: DC | PRN
  Administered 2022-03-20: 50 mg via INTRAVENOUS
  Administered 2022-03-20: 100 mg via INTRAVENOUS
  Administered 2022-03-20: 50 mg via INTRAVENOUS

## 2022-03-20 MED ORDER — LACTATED RINGERS IV SOLN: INTRAVENOUS | Status: DC

## 2022-03-20 NOTE — Anesthesia Procedure Notes (Signed)
Date/Time: 03/20/2022 2:08 PM  Performed by: Franco Nones, CRNAPre-anesthesia Checklist: Patient identified, Emergency Drugs available, Suction available, Timeout performed and Patient being monitored Patient Re-evaluated:Patient Re-evaluated prior to induction Oxygen Delivery Method: Nasal Cannula

## 2022-03-20 NOTE — Op Note (Signed)
[facility] Patient Name: [name] Boyle Procedure Date: 03/20/2022 2:02 PM MRN: [id number] Date of Birth: [birth year] Attending MD: Katrinka Blazing ,  CSN: [id number] Age: 71 Admit Type: Outpatient Procedure:                Upper GI endoscopy Indications:              Gastro-esophageal reflux disease, Regurgitation Providers:                Katrinka Blazing, Jannett Celestine, RN, Pandora Leiter,                            Technician Referring MD:              Medicines:                Monitored Anesthesia Care Complications:            No immediate complications. Estimated Blood Loss:     Estimated blood loss: none. Procedure:                Pre-Anesthesia Assessment:                           - Prior to the procedure, a History and Physical                            was performed, and patient medications, allergies                            and sensitivities were reviewed. The patient's                            tolerance of previous anesthesia was reviewed.                           - The risks and benefits of the procedure and the                            sedation options and risks were discussed with the                            patient. All questions were answered and informed                            consent was obtained.                           - ASA Grade Assessment: II - A patient with mild                            systemic disease.                           After obtaining informed consent, the endoscope was                            passed under direct vision. Throughout the  procedure, the patient's blood pressure, pulse, and                            oxygen saturations were monitored continuously. The                            [id number]-H190 ([id number]) scope was introduced through the                            mouth, and advanced to the second part of duodenum.                            The upper GI endoscopy was accomplished without                             difficulty. The patient tolerated the procedure                            well. Scope In: 2:13:25 PM Scope Out: 2:20:33 PM Total Procedure Duration: 0 hours 7 minutes 8 seconds  Findings:      The examined esophagus was normal.      The gastroesophageal flap valve was visualized endoscopically and       classified as Hill Grade II (fold present, opens with respiration).      The stomach was normal.      The examined duodenum was normal. Impression:               - Normal esophagus.                           - Gastroesophageal flap valve classified as Hill                            Grade II (fold present, opens with respiration).                           - Normal stomach.                           - Normal examined duodenum.                           - No specimens collected. Moderate Sedation:      Per Anesthesia Care Recommendation:           - Discharge patient to home (ambulatory).                           - Resume previous diet.                           - Referral for pH impedance testing and esophageal                            manometry.                           -  Will provide TIF related materials by mail.                           - Continue present medications. Procedure Code(s):        --- Professional ---                           [phone], Esophagogastroduodenoscopy, flexible,                            transoral; diagnostic, including collection of                            specimen(s) by brushing or washing, when performed                            (separate procedure) Diagnosis Code(s):        --- Professional ---                           K21.9, Gastro-esophageal reflux disease without                            esophagitis                           R11.10, Vomiting, unspecified CPT copyright 2019 American Medical Association. All rights reserved. The codes documented in this report are preliminary and upon coder review may  be  revised to meet current compliance requirements. Katrinka Blazing, MD Katrinka Blazing,  03/20/2022 2:26:32 PM This report has been signed electronically. Number of Addenda: 0

## 2022-03-20 NOTE — Anesthesia Preprocedure Evaluation (Signed)
Anesthesia Evaluation  Patient identified by MRN, date of birth, ID band Patient awake    Reviewed: Allergy & Precautions, NPO status , Patient's Chart, lab work & pertinent test results  Airway Mallampati: II  TM Distance: >3 FB Neck ROM: Full    Dental   Pulmonary neg pulmonary ROS,    Pulmonary exam normal        Cardiovascular Exercise Tolerance: Good METS: 3 - Mets hypertension, negative cardio ROS Normal cardiovascular exam     Neuro/Psych negative neurological ROS     GI/Hepatic Neg liver ROS, GERD  ,  Endo/Other  negative endocrine ROSdiabetes  Renal/GU negative Renal ROS     Musculoskeletal negative musculoskeletal ROS (+)   Abdominal Normal abdominal exam  (+)   Peds  Hematology negative hematology ROS (+)   Anesthesia Other Findings   Reproductive/Obstetrics                             Anesthesia Physical Anesthesia Plan  ASA: 2  Anesthesia Plan: General   Post-op Pain Management:    Induction: Intravenous  PONV Risk Score and Plan: 1 and TIVA  Airway Management Planned: Nasal Cannula  Additional Equipment:   Intra-op Plan:   Post-operative Plan:   Informed Consent: I have reviewed the patients History and Physical, chart, labs and discussed the procedure including the risks, benefits and alternatives for the proposed anesthesia with the patient or authorized representative who has indicated his/her understanding and acceptance.     Dental advisory given  Plan Discussed with: CRNA  Anesthesia Plan Comments:         Anesthesia Quick Evaluation

## 2022-03-20 NOTE — Transfer of Care (Signed)
Immediate Anesthesia Transfer of Care Note  Patient: [name] Boyle  Procedure(s) Performed: ESOPHAGOGASTRODUODENOSCOPY (EGD) WITH PROPOFOL  Patient Location: Short Stay  Anesthesia Type:General  Level of Consciousness: awake and patient cooperative  Airway & Oxygen Therapy: Patient Spontanous Breathing  Post-op Assessment: Report given to RN and Post -op Vital signs reviewed and stable  Post vital signs: Reviewed and stable  Last Vitals:  Vitals Value Taken Time  BP 99/53 1426  Temp 97.4 1426  Pulse 56 1426  Resp 17 1426  SpO2 97 1426    Last Pain:  Vitals:   03/20/22 1257  TempSrc: Oral  PainSc: 0-No pain      Patients Stated Pain Goal: 8 (03/20/22 1257)  Complications: No notable events documented.

## 2022-03-20 NOTE — H&P (Signed)
[name] Boyle is an 71 y.o. male.   Chief Complaint: Regurgitation and GERD HPI: [name] Boyle is a 71 y.o. male  with past medical history of diabetes, GERD, hyperlipidemia and hypertension, who presents for follow up of GERD and regurgitation.  Patient reports that he has been taking omeprazole 40 mg twice a day but is still presenting regurgitation episodes despite taking the medication compliantly.  He has also taking famotidine as needed.  States that his symptoms are slightly better but not completely resolved.  Past Medical History:  Diagnosis Date   Diabetes mellitus without complication (HCC)    GERD (gastroesophageal reflux disease)    High cholesterol    Hypertension     Past Surgical History:  Procedure Laterality Date   COLONOSCOPY N/A 03/26/2019   Procedure: COLONOSCOPY;  Surgeon: Malissa Hippo, MD;  [facility];  Service: Endoscopy;  Laterality: N/A;  12:00   ESOPHAGOGASTRODUODENOSCOPY N/A 08/28/2018   Procedure: ESOPHAGOGASTRODUODENOSCOPY (EGD);  Surgeon: Malissa Hippo, MD;  [facility]: [facility];  Service: Endoscopy;  Laterality: N/A;  2:00   EXPLORATORY LAPAROTOMY  07/2017   ? partial malrotation- [facility]    KNEE SURGERY     LAPAROTOMY N/A 07/21/2018   Procedure: EXPLORATORY LAPAROTOMY, SMALL BOWEL RESECTION;  Surgeon: [name], MD;  Location: [facility];  Service: General;  Laterality: N/A;   LYSIS OF ADHESION N/A 07/21/2018   Procedure: LYSIS OF ADHESION;  Surgeon: [name], MD;  Location: [facility];  Service: General;  Laterality: N/A;    Family History  Problem Relation Age of Onset   Diabetes Father    Diabetes Maternal Grandfather    Social History:  reports that he has never smoked. He has never used smokeless tobacco. He reports current alcohol use. He reports that he does not use drugs.  Allergies: No Known Allergies  Medications Prior to Admission  Medication Sig Dispense Refill   amLODipine (NORVASC) 10 MG tablet  Take 10 mg by mouth every morning.      Ascorbic Acid (VITAMIN C) 1000 MG tablet Take 1,000 mg by mouth every morning.      aspirin EC 81 MG tablet Take 81 mg by mouth daily.     atorvastatin (LIPITOR) 40 MG tablet Take 40 mg by mouth every evening.      azelastine (ASTELIN) 0.1 % nasal spray Place 1 spray into both nostrils every morning. Use in each nostril as directed-May use in the evening as needed     B Complex-C (SUPER B COMPLEX PO) Take 1 tablet by mouth daily.     chlorthalidone (HYGROTON) 25 MG tablet Take 25 mg by mouth daily.     Cholecalciferol (VITAMIN D-3) 25 MCG (1000 UT) CAPS Take 1,000 Units by mouth daily.     clobetasol cream (TEMOVATE) 0.05 % Apply 1 application  topically daily as needed (irritation). 2-3 times per week.     Coenzyme Q10 300 MG CAPS Take 300 mg by mouth daily.     docusate sodium (COLACE) 50 MG capsule Take 50 mg by mouth daily.     enalapril (VASOTEC) 20 MG tablet Take 20 mg by mouth 2 (two) times daily.     famotidine (PEPCID) 20 MG tablet Take 1 tablet (20 mg total) by mouth at bedtime. (Patient taking differently: Take 20 mg by mouth daily as needed for heartburn or indigestion.)     fexofenadine (ALLEGRA) 180 MG tablet Take 180 mg by mouth every morning.  fluticasone (CUTIVATE) 0.05 % cream Apply 1 Application topically 2 (two) times daily as needed (irritation).     fluticasone (FLONASE) 50 MCG/ACT nasal spray Place 1 spray into both nostrils daily.     folic acid (FOLVITE) 800 MCG tablet Take 6,400 mcg by mouth daily.     Glucosamine HCl 1500 MG TABS Take 3,000 mg by mouth daily.     hydrALAZINE (APRESOLINE) 10 MG tablet Take 10 mg by mouth 2 (two) times daily.     ketoconazole (NIZORAL) 2 % cream Apply 1 application  topically daily as needed for irritation. 2-3 times per week.     levothyroxine (SYNTHROID) 50 MCG tablet Take 50 mcg by mouth daily before breakfast.     meloxicam (MOBIC) 15 MG tablet Take 15 mg by mouth daily as needed for pain.      metFORMIN (GLUCOPHAGE) 500 MG tablet Take 500 mg by mouth every evening.      Multiple Vitamin (MULTIVITAMIN WITH MINERALS) TABS tablet Take 1 tablet by mouth daily.     naphazoline-pheniramine (NAPHCON-A) 0.025-0.3 % ophthalmic solution Place 1-2 drops into both eyes daily.     Omega-3 Fatty Acids (FISH OIL ULTRA) 1400 MG CAPS Take 1,400 mg by mouth daily.     omeprazole (PRILOSEC) 40 MG capsule TAKE 1 CAPSULE BY MOUTH  TWICE DAILY 180 capsule 3   Polyethyl Glycol-Propyl Glycol 0.4-0.3 % SOLN Place 1-2 drops into both eyes daily.     Turmeric (QC TUMERIC COMPLEX PO) Take 3,000 mg by mouth daily.      Results for orders placed or performed during the hospital encounter of 03/20/22 (from the past 48 hour(s))  Glucose, capillary     Status: Abnormal   Collection Time: 03/20/22 12:46 PM  Result Value Ref Range   Glucose-Capillary 115 (H) 70 - 99 mg/dL    Comment: Glucose reference range applies only to samples taken after fasting for at least 8 hours.   No results found.  Review of Systems  All other systems reviewed and are negative.   Blood pressure (!) 152/77, temperature 97.8 F (36.6 C), temperature source Oral, resp. rate 17, SpO2 100 %. Physical Exam  GENERAL: The patient is AO x3, in no acute distress. HEENT: Head is normocephalic and atraumatic. EOMI are intact. Mouth is well hydrated and without lesions. NECK: Supple. No masses LUNGS: Clear to auscultation. No presence of rhonchi/wheezing/rales. Adequate chest expansion HEART: RRR, normal s1 and s2. ABDOMEN: Soft, nontender, no guarding, no peritoneal signs, and nondistended. BS +. No masses. EXTREMITIES: Without any cyanosis, clubbing, rash, lesions or edema. NEUROLOGIC: AOx3, no focal motor deficit. SKIN: no jaundice, no rashes  Assessment/Plan [name] Boyle is a 71 y.o. male  with past medical history of diabetes, GERD, hyperlipidemia and hypertension, who presents for follow up of GERD and regurgitation.  We will  proceed with EGD.  Dolores Frame, MD 03/20/2022, 1:29 PM

## 2022-03-20 NOTE — Anesthesia Postprocedure Evaluation (Signed)
Anesthesia Post Note  Patient: Mong Neal  Procedure(s) Performed: ESOPHAGOGASTRODUODENOSCOPY (EGD) WITH PROPOFOL  Patient location during evaluation: Phase II Anesthesia Type: General Level of consciousness: awake and alert Pain management: pain level controlled Vital Signs Assessment: post-procedure vital signs reviewed and stable Respiratory status: spontaneous breathing, nonlabored ventilation, respiratory function stable and patient connected to nasal cannula oxygen Cardiovascular status: blood pressure returned to baseline and stable Postop Assessment: no apparent nausea or vomiting Anesthetic complications: no   There were no known notable events for this encounter.   Last Vitals:  Vitals:   03/20/22 1257 03/20/22 1424  BP: (!) 152/77 (!) 86/44  Pulse:  61  Resp: 17 17  Temp: 36.6 C (!) 36.3 C  SpO2: 100% 97%    Last Pain:  Vitals:   03/20/22 1424  TempSrc: Oral  PainSc: 0-No pain                 Glynis Smiles

## 2022-03-20 NOTE — Discharge Instructions (Signed)
You are being discharged to home.  Resume your previous diet.  Referral for pH impedance testing and esophageal manometry. Continue your present medications.

## 2022-03-26 ENCOUNTER — Encounter (HOSPITAL_COMMUNITY): Payer: Self-pay | Admitting: Gastroenterology

## 2022-05-04 ENCOUNTER — Telehealth (INDEPENDENT_AMBULATORY_CARE_PROVIDER_SITE_OTHER): Payer: Self-pay | Admitting: Gastroenterology

## 2022-05-04 ENCOUNTER — Other Ambulatory Visit (INDEPENDENT_AMBULATORY_CARE_PROVIDER_SITE_OTHER): Payer: Self-pay | Admitting: Gastroenterology

## 2022-05-04 NOTE — Progress Notes (Signed)
error 

## 2022-05-04 NOTE — Telephone Encounter (Signed)
Called the patient to discuss results of pH impedance testing and esophageal manometry.  Patient performed a pH impedance testing on PPI.  There was normal suppression of acid exposure and no episodes of nonacidic reflux.  There was frequent throat clearing but this was not associated to acid exposure. Esophageal manometry was also normal. Patient reports that he is still presenting throat clearing but denies having any other symptoms.  We discussed the possibility of globus sensation as the etiology of his symptoms, he will try to decrease his PPI to once a day dosing and will cause to inform symptom control.  We discussed the possibility of low dose TCAs for management of low sensation, he will consider this as an option if his symptoms are persistent.

## 2022-05-24 ENCOUNTER — Ambulatory Visit (INDEPENDENT_AMBULATORY_CARE_PROVIDER_SITE_OTHER): Payer: 59 | Admitting: Gastroenterology

## 2022-05-24 ENCOUNTER — Encounter (INDEPENDENT_AMBULATORY_CARE_PROVIDER_SITE_OTHER): Payer: Self-pay | Admitting: Gastroenterology

## 2022-05-24 VITALS — BP 133/69 | HR 76 | Temp 98.6°F | Ht 65.0 in | Wt 183.6 lb

## 2022-05-24 DIAGNOSIS — R0989 Other specified symptoms and signs involving the circulatory and respiratory systems: Secondary | ICD-10-CM

## 2022-05-24 MED ORDER — AMITRIPTYLINE HCL 10 MG PO TABS: 10.0000 mg | ORAL_TABLET | Freq: Every day | ORAL | 1 refills | Status: DC

## 2022-05-24 NOTE — Patient Instructions (Signed)
Please continue once daily prilosec We will start you on amitriptyline for the sensation in your throat, please take this prior to bedtime.  We will reassess how you are doing in 4-6 weeks

## 2022-05-24 NOTE — Progress Notes (Signed)
Referring Provider: Jani Gravel, MD Primary Care Physician:  Jani Gravel, MD Primary GI Physician: Jenetta Downer   Chief Complaint  Patient presents with   Follow-up    Patient here today for a follow up since his 24 hour ph Impedance study done on 04/24/2022 at [facility].   HPI:   [name] Boyle is a 71 y.o. male with past medical history of DM, GERD, high cholesterol, HTN.   Patient presenting today for follow up of globus sensation/GERD  Last visit 02/12/22, at that time taking omeprazole 40mg  BID and famotidine 20mg  BID with some breakthrough reflux symptoms. Having some regurgitation of contents into his throat if he does not take the medications. Denied heartburn.   Advised to continue with PPI BID and H2B PRN, schedule EGD for further evaluation, EGD as outlined below, patient referred for pH impedence and esophageal manometry, given TIF related materials.   -pH impedence testing done on PPI, normal suppression of acid exposure and no episodes of nonacidic reflux. There was frequent throat clearing but this was not associated to acid exposure. -Esophageal manometry was also normal.   Patient continued with throat clearing/globus sensation but denied any other symptoms at time of discussion of results of the pH impedence and manometry testing. PPI decreased to once daily dosing, considerations for low dose TCA for management of globus sensation.   Present:  States that he continues to have globus sensation, this is present all the time, causing him to clear his throat. He denies actual heartburn. No soreness to throat, has an occasional cough.  Feels that omeprazole 40mg  once daily does help the sensation but does not resolve it. He states if he does not take it he has abdominal discomfort. No red flag symptoms. Patient denies melena, hematochezia, nausea, vomiting, diarrhea, constipation, dysphagia, odyonophagia, early satiety or weight loss. Saw allergist in the past who felt  symptoms were not related to allergies/sinus issues. Also saw ENT who suggested GERD was cause of his symptoms, however, even on double dosing of PPI and H2B previously, still having globus sensation.    Last EGD:03/20/22- Normal esophagus. - Gastroesophageal flap valve classified as Hill Grade II (fold present, opens with respiration). - Normal stomach. - Normal examined duodenum. Last Colonoscopy: 2020 The entire examined colon is normal. - The distal rectum and anal verge are normal on retroflexion view. - No specimens collected.  Past Medical History:  Diagnosis Date   Diabetes mellitus without complication (HCC)    GERD (gastroesophageal reflux disease)    High cholesterol    Hypertension     Past Surgical History:  Procedure Laterality Date   COLONOSCOPY N/A 03/26/2019   Procedure: COLONOSCOPY;  Surgeon: Rogene Houston, MD;  [facility];  Service: Endoscopy;  Laterality: N/A;  12:00   ESOPHAGOGASTRODUODENOSCOPY N/A 08/28/2018   Procedure: ESOPHAGOGASTRODUODENOSCOPY (EGD);  Surgeon: Rogene Houston, MD;  [facility];  Service: Endoscopy;  Laterality: N/A;  2:00   ESOPHAGOGASTRODUODENOSCOPY (EGD) WITH PROPOFOL N/A 03/20/2022   Procedure: ESOPHAGOGASTRODUODENOSCOPY (EGD) WITH PROPOFOL;  Surgeon: Harvel Quale, MD;  [facility];  Service: Gastroenterology;  Laterality: N/A;  155 ASA 2   EXPLORATORY LAPAROTOMY  07/2017   ? partial malrotation- [facility]    KNEE SURGERY     LAPAROTOMY N/A 07/21/2018   Procedure: EXPLORATORY LAPAROTOMY, SMALL BOWEL RESECTION;  Surgeon: Virl Cagey, MD;  Location: [facility];  Service: General;  Laterality: N/A;   LYSIS OF ADHESION N/A 07/21/2018  Procedure: LYSIS OF ADHESION;  Surgeon: Virl Cagey, MD;  Location: [facility];  Service: General;  Laterality: N/A;    Current Outpatient Medications  Medication Sig Dispense Refill   amLODipine (NORVASC) 10 MG tablet Take 10 mg by mouth every  morning.      Ascorbic Acid (VITAMIN C) 1000 MG tablet Take 1,000 mg by mouth every morning.      aspirin EC 81 MG tablet Take 81 mg by mouth daily.     atorvastatin (LIPITOR) 40 MG tablet Take 40 mg by mouth every evening.      azelastine (ASTELIN) 0.1 % nasal spray Place 1 spray into both nostrils every morning. Use in each nostril as directed-May use in the evening as needed     B Complex-C (SUPER B COMPLEX PO) Take 1 tablet by mouth daily.     chlorthalidone (HYGROTON) 25 MG tablet Take 25 mg by mouth daily.     Cholecalciferol (VITAMIN D-3) 25 MCG (1000 UT) CAPS Take 1,000 Units by mouth daily.     clobetasol cream (TEMOVATE) [id number] % Apply 1 application  topically daily as needed (irritation). 2-3 times per week.     Coenzyme Q10 300 MG CAPS Take 300 mg by mouth daily.     docusate sodium (COLACE) 50 MG capsule Take 50 mg by mouth daily.     enalapril (VASOTEC) 20 MG tablet Take 20 mg by mouth 2 (two) times daily.     famotidine (PEPCID) 20 MG tablet Take 1 tablet (20 mg total) by mouth at bedtime. (Patient taking differently: Take 20 mg by mouth daily as needed for heartburn or indigestion.)     fexofenadine (ALLEGRA) 180 MG tablet Take 180 mg by mouth every morning.      fluticasone (CUTIVATE) 0.05 % cream Apply 1 Application topically 2 (two) times daily as needed (irritation).     fluticasone (FLONASE) 50 MCG/ACT nasal spray Place 1 spray into both nostrils daily.     folic acid (FOLVITE) [id number] MCG tablet Take 6,400 mcg by mouth daily.     Glucosamine HCl 1500 MG TABS Take 3,000 mg by mouth daily.     hydrALAZINE (APRESOLINE) 10 MG tablet Take 10 mg by mouth 2 (two) times daily.     ketoconazole (NIZORAL) 2 % cream Apply 1 application  topically daily as needed for irritation. 2-3 times per week.     levothyroxine (SYNTHROID) 50 MCG tablet Take 50 mcg by mouth daily before breakfast.     meloxicam (MOBIC) 15 MG tablet Take 15 mg by mouth daily as needed for pain.     metFORMIN  (GLUCOPHAGE) 500 MG tablet Take 500 mg by mouth every evening.      Multiple Vitamin (MULTIVITAMIN WITH MINERALS) TABS tablet Take 1 tablet by mouth daily.     naphazoline-pheniramine (NAPHCON-A) 0.025-0.3 % ophthalmic solution Place 1-2 drops into both eyes daily.     Omega-3 Fatty Acids (FISH OIL ULTRA) 1400 MG CAPS Take 1,400 mg by mouth daily.     omeprazole (PRILOSEC) 40 MG capsule TAKE 1 CAPSULE BY MOUTH  TWICE DAILY 180 capsule 3   Polyethyl Glycol-Propyl Glycol 0.4-0.3 % SOLN Place 1-2 drops into both eyes daily.     Turmeric (QC TUMERIC COMPLEX PO) Take 3,000 mg by mouth daily.     No current facility-administered medications for this visit.    Allergies as of 05/24/2022   (No Known Allergies)    Family History  Problem Relation Age of Onset  Diabetes Father    Diabetes Maternal Grandfather     Social History   Socioeconomic History   Marital status: Divorced    Spouse name: Not on file   Number of children: Not on file   Years of education: Not on file   Highest education level: Not on file  Occupational History   Not on file  Tobacco Use   Smoking status: Never   Smokeless tobacco: Never  Vaping Use   Vaping Use: Never used  Substance and Sexual Activity   Alcohol use: Yes    Comment: moderate   Drug use: Never   Sexual activity: Not on file  Other Topics Concern   Not on file  Social History Narrative   Not on file   Social Determinants of Health   Financial Resource Strain: Not on file  Food Insecurity: Not on file  Transportation Needs: Not on file  Physical Activity: Not on file  Stress: Not on file  Social Connections: Not on file   Review of systems General: negative for malaise, night sweats, fever, chills, weight loss Neck: Negative for lumps, goiter, pain and significant neck swelling Resp: Negative for cough, wheezing, dyspnea at rest CV: Negative for chest pain, leg swelling, palpitations, orthopnea GI: denies melena, hematochezia,  nausea, vomiting, diarrhea, constipation, dysphagia, odyonophagia, early satiety or unintentional weight loss. +globus sensation MSK: Negative for joint pain or swelling, back pain, and muscle pain. Derm: Negative for itching or rash Psych: Denies depression, anxiety, memory loss, confusion. No homicidal or suicidal ideation.  Heme: Negative for prolonged bleeding, bruising easily, and swollen nodes. Endocrine: Negative for cold or heat intolerance, polyuria, polydipsia and goiter. Neuro: negative for tremor, gait imbalance, syncope and seizures. The remainder of the review of systems is noncontributory.  Physical Exam: BP 133/69 (BP Location: Left Arm, Patient Position: Sitting, Cuff Size: Large)   Pulse 76   Temp 98.6 F (37 C) (Oral)   Ht 5\' 5"  (1.651 m)   Wt 183 lb 9.6 oz (83.3 kg)   BMI 30.55 kg/m  General:   Alert and oriented. No distress noted. Pleasant and cooperative.  Head:  Normocephalic and atraumatic. Eyes:  Conjuctiva clear without scleral icterus. Mouth:  Oral mucosa pink and moist. Good dentition. No lesions. Heart: Normal rate and rhythm, s1 and s2 heart sounds present.  Lungs: Clear lung sounds in all lobes. Respirations equal and unlabored. Abdomen:  +BS, soft, non-tender and non-distended. No rebound or guarding. No HSM or masses noted. Derm: No palmar erythema or jaundice Msk:  Symmetrical without gross deformities. Normal posture. Extremities:  Without edema. Neurologic:  Alert and  oriented x4 Psych:  Alert and cooperative. Normal mood and affect.  Invalid input(s): "6 MONTHS"   ASSESSMENT: Wayland Baik is a 71 y.o. male presenting today for follow up of globus sensation.  Patient had pH impedence and manometry testing in May that were overall unremarkable, noted throat clearing throughout. Results previously discussed with the patient by Dr. June, however, patient had some further questions regarding the etiology of globus sensation given testing was  not indicative of acid reflux as the cause, I discussed with him that sensation can be secondary to anxiety/overactive nervous system, we discussed trial of low dose TCA to see if this helps to alleviate this sensation. Patient is amenable to trying this. We will keep him on once daily dosing of PPI as he feels some relief from this. I advised him that he would hopefully see results within about 1  month, potentially may need to titrate dosage up some if no improvement. If he sees improvement can consider keeping him on this for 3-6 months and then trialing him off to see if he continues to require TCA therapy. Given his age, will start amitriptyline at 10mg  QHS.    PLAN:  Continue PPI daily  2. Rx amitriptyline 10mg  QHS   All questions were answered, patient verbalized understanding and is in agreement with plan as outlined above.   Follow Up: 4-6 wks, can be virtual  [name] Boyle L. , MSN, APRN, AGNP-C Adult-Gerontology Nurse Practitioner [facility]

## 2022-06-12 ENCOUNTER — Encounter: Payer: Self-pay | Admitting: Gastroenterology

## 2022-07-11 ENCOUNTER — Other Ambulatory Visit (INDEPENDENT_AMBULATORY_CARE_PROVIDER_SITE_OTHER): Payer: Self-pay | Admitting: Gastroenterology

## 2022-07-11 NOTE — Telephone Encounter (Signed)
Last seen 05/24/22

## 2022-07-19 ENCOUNTER — Ambulatory Visit (INDEPENDENT_AMBULATORY_CARE_PROVIDER_SITE_OTHER): Payer: 59 | Admitting: Gastroenterology

## 2022-07-23 ENCOUNTER — Encounter (INDEPENDENT_AMBULATORY_CARE_PROVIDER_SITE_OTHER): Payer: Self-pay | Admitting: Gastroenterology

## 2022-07-23 ENCOUNTER — Ambulatory Visit (INDEPENDENT_AMBULATORY_CARE_PROVIDER_SITE_OTHER): Payer: 59 | Admitting: Gastroenterology

## 2022-07-23 VITALS — BP 144/79 | HR 78 | Temp 98.9°F | Ht 65.0 in | Wt 184.6 lb

## 2022-07-23 DIAGNOSIS — R09A2 Foreign body sensation, throat: Secondary | ICD-10-CM

## 2022-07-23 MED ORDER — AMITRIPTYLINE HCL 25 MG PO TABS: 25.0000 mg | ORAL_TABLET | Freq: Every day | ORAL | 1 refills | Status: DC

## 2022-07-23 NOTE — Patient Instructions (Addendum)
I'm glad you are feeling better! We will try upping the dose of your amitriptyline to 25mg  nightly, please let me know if you have side effects or feel you are not tolerating this dosage. We will reassess in about 3 months to see how you are doing on this.

## 2022-07-23 NOTE — Progress Notes (Addendum)
Referring Provider: Jani Gravel, MD Primary Care Physician:  Jani Gravel, MD Primary GI Physician: Jenetta Downer   Chief Complaint  Patient presents with   Follow-up    Follow up on starting amtriptyline. Reports doing better and has been able to stop famotidine and omeprazole.    HPI:   [name] Boyle is a 71 y.o. male with past medical history of DM, GERD, high cholesterol, HTN.    Patient presenting today for follow up of globus sensation.  History:   previously taking omeprazole 40mg  BID and famotidine 20mg  BID with continue reflux symptoms, EGD in July as outlined below, patient referred for pH impedence and esophageal manometry, given TIF related materials.    -pH impedence testing done on PPI, normal suppression of acid exposure and no episodes of nonacidic reflux. There was frequent throat clearing but this was not associated to acid exposure. -Esophageal manometry was also normal.   Last seen 05/24/22, at that time continued to have globus sensation, present all the time, causing him to clear his throat. He denies actual heartburn. Having an occasional cough.  felt omeprazole 40mg  once daily does help the sensation but does not resolve it. Saw allergist in the past who felt symptoms were not related to allergies/sinus issues. Also saw ENT who suggested GERD was cause of his symptoms, however, even on double dosing of PPI and H2B previously, still having globus sensation.  Patient advised to continue PPI daily, start amitryptiline 10mg  QHS.  Present: Patient states globus sensation is about 75% better. He denies any GERD symptoms. States that he stopped PPI and H2B, feels he is doing pretty well. He states that he is no longer having issues with swallowing or feeling like he is choking or coughing when swallowing. Globus sensation still present at times though feels it is improved quite a bit. Doing well on TCA thus far, has experienced no side effects. No red flag symptoms. Patient denies  melena, hematochezia, nausea, vomiting, diarrhea, constipation, dysphagia, odyonophagia, early satiety or weight loss.   Last EGD:03/20/22- Normal esophagus. - Gastroesophageal flap valve classified as Hill Grade II (fold present, opens with respiration). - Normal stomach. - Normal examined duodenum. Last Colonoscopy: 2020 The entire examined colon is normal. - The distal rectum and anal verge are normal on retroflexion view. - No specimens collected.    Past Medical History:  Diagnosis Date   Diabetes mellitus without complication (HCC)    GERD (gastroesophageal reflux disease)    High cholesterol    Hypertension     Past Surgical History:  Procedure Laterality Date   COLONOSCOPY N/A 03/26/2019   Procedure: COLONOSCOPY;  Surgeon: Rogene Houston, MD;  [facility];  Service: Endoscopy;  Laterality: N/A;  12:00   ESOPHAGOGASTRODUODENOSCOPY N/A 08/28/2018   Procedure: ESOPHAGOGASTRODUODENOSCOPY (EGD);  Surgeon: Rogene Houston, MD;  [facility];  Service: Endoscopy;  Laterality: N/A;  2:00   ESOPHAGOGASTRODUODENOSCOPY (EGD) WITH PROPOFOL N/A 03/20/2022   Procedure: ESOPHAGOGASTRODUODENOSCOPY (EGD) WITH PROPOFOL;  Surgeon: Harvel Quale, MD;  [facility];  Service: Gastroenterology;  Laterality: N/A;  155 ASA 2   EXPLORATORY LAPAROTOMY  07/2017   ? partial malrotation- [facility]    KNEE SURGERY     LAPAROTOMY N/A 07/21/2018   Procedure: EXPLORATORY LAPAROTOMY, SMALL BOWEL RESECTION;  Surgeon: Virl Cagey, MD;  Location: [facility];  Service: General;  Laterality: N/A;   LYSIS OF ADHESION N/A 07/21/2018   Procedure: LYSIS OF ADHESION;  Surgeon: Curlene Labrum  C, MD;  Location: [facility];  Service: General;  Laterality: N/A;    Current Outpatient Medications  Medication Sig Dispense Refill   amitriptyline (ELAVIL) 10 MG tablet TAKE 1 TABLET BY MOUTH AT BEDTIME 30 tablet 0   amLODipine (NORVASC) 10 MG tablet Take 10 mg by mouth  every morning.      Ascorbic Acid (VITAMIN C) 1000 MG tablet Take 1,000 mg by mouth every morning.      aspirin EC 81 MG tablet Take 81 mg by mouth daily.     atorvastatin (LIPITOR) 40 MG tablet Take 40 mg by mouth every evening.      azelastine (ASTELIN) 0.1 % nasal spray Place 1 spray into both nostrils every morning. Use in each nostril as directed-May use in the evening as needed     B Complex-C (SUPER B COMPLEX PO) Take 1 tablet by mouth daily.     chlorthalidone (HYGROTON) 25 MG tablet Take 25 mg by mouth daily.     Cholecalciferol (VITAMIN D-3) 25 MCG (1000 UT) CAPS Take 1,000 Units by mouth daily.     clobetasol cream (TEMOVATE) [id number] % Apply 1 application  topically daily as needed (irritation). 2-3 times per week.     Coenzyme Q10 300 MG CAPS Take 300 mg by mouth daily.     docusate sodium (COLACE) 50 MG capsule Take 50 mg by mouth daily.     enalapril (VASOTEC) 20 MG tablet Take 20 mg by mouth 2 (two) times daily.     fexofenadine (ALLEGRA) 180 MG tablet Take 180 mg by mouth every morning.      fluticasone (CUTIVATE) 0.05 % cream Apply 1 Application topically 2 (two) times daily as needed (irritation).     fluticasone (FLONASE) 50 MCG/ACT nasal spray Place 1 spray into both nostrils daily.     folic acid (FOLVITE) [id number] MCG tablet Take 6,400 mcg by mouth daily.     Glucosamine HCl 1500 MG TABS Take 3,000 mg by mouth daily.     hydrALAZINE (APRESOLINE) 10 MG tablet Take 10 mg by mouth 3 (three) times daily.     ketoconazole (NIZORAL) 2 % cream Apply 1 application  topically daily as needed for irritation. 2-3 times per week.     levothyroxine (SYNTHROID) 50 MCG tablet Take 50 mcg by mouth daily before breakfast.     meloxicam (MOBIC) 15 MG tablet Take 15 mg by mouth daily as needed for pain.     metFORMIN (GLUCOPHAGE) 500 MG tablet Take 500 mg by mouth every evening.      Multiple Vitamin (MULTIVITAMIN WITH MINERALS) TABS tablet Take 1 tablet by mouth daily.     naphazoline-pheniramine  (NAPHCON-A) 0.025-0.3 % ophthalmic solution Place 1-2 drops into both eyes daily.     Omega-3 Fatty Acids (FISH OIL ULTRA) 1400 MG CAPS Take 1,400 mg by mouth daily.     Polyethyl Glycol-Propyl Glycol 0.4-0.3 % SOLN Place 1-2 drops into both eyes daily.     Turmeric (QC TUMERIC COMPLEX PO) Take 3,000 mg by mouth daily.     No current facility-administered medications for this visit.    Allergies as of 07/23/2022   (No Known Allergies)    Family History  Problem Relation Age of Onset   Diabetes Father    Diabetes Maternal Grandfather     Social History   Socioeconomic History   Marital status: Divorced    Spouse name: Not on file   Number of children: Not on file   Years  of education: Not on file   Highest education level: Not on file  Occupational History   Not on file  Tobacco Use   Smoking status: Former    Types: Cigarettes    Start date: 07/23/1964    Quit date: 07/23/2000    Years since quitting: 22.0    Passive exposure: Past   Smokeless tobacco: Never  Vaping Use   Vaping Use: Never used  Substance and Sexual Activity   Alcohol use: Yes    Comment: moderate   Drug use: Never   Sexual activity: Not on file  Other Topics Concern   Not on file  Social History Narrative   Not on file   Social Determinants of Health   Financial Resource Strain: Not on file  Food Insecurity: Not on file  Transportation Needs: Not on file  Physical Activity: Not on file  Stress: Not on file  Social Connections: Not on file   Review of systems General: negative for malaise, night sweats, fever, chills, weight loss Neck: Negative for lumps, goiter, pain and significant neck swelling Resp: Negative for cough, wheezing, dyspnea at rest CV: Negative for chest pain, leg swelling, palpitations, orthopnea GI: denies melena, hematochezia, nausea, vomiting, diarrhea, constipation, dysphagia, odyonophagia, early satiety or unintentional weight loss. +globus sensation MSK: Negative  for joint pain or swelling, back pain, and muscle pain. Derm: Negative for itching or rash Psych: Denies depression, anxiety, memory loss, confusion. No homicidal or suicidal ideation.  Heme: Negative for prolonged bleeding, bruising easily, and swollen nodes. Endocrine: Negative for cold or heat intolerance, polyuria, polydipsia and goiter. Neuro: negative for tremor, gait imbalance, syncope and seizures. The remainder of the review of systems is noncontributory.  Physical Exam: BP (!) 144/79 (BP Location: Left Arm, Patient Position: Sitting, Cuff Size: Large)   Pulse 78   Temp 98.9 F (37.2 C) (Oral)   Ht 5\' 5"  (1.651 m)   Wt 184 lb 9.6 oz (83.7 kg)   BMI 30.72 kg/m  General:   Alert and oriented. No distress noted. Pleasant and cooperative.  Head:  Normocephalic and atraumatic. Eyes:  Conjuctiva clear without scleral icterus. Mouth:  Oral mucosa pink and moist. Good dentition. No lesions. Heart: Normal rate and rhythm, s1 and s2 heart sounds present.  Lungs: Clear lung sounds in all lobes. Respirations equal and unlabored. Abdomen:  +BS, soft, non-tender and non-distended. No rebound or guarding. No HSM or masses noted. Derm: No palmar erythema or jaundice Msk:  Symmetrical without gross deformities. Normal posture. Extremities:  Without edema. Neurologic:  Alert and  oriented x4 Psych:  Alert and cooperative. Normal mood and affect.  Invalid input(s): "6 MONTHS"   ASSESSMENT: [name] Boyle is a 71 y.o. male presenting today for follow up of globus sensation.  EGD, Ph Impedence testing and esophageal manometry previously without findings to suggest cause of patient's globus sensation. Started on amitriptyline 10mg  QHS at last visit. Today notes symptoms are much better, reports about 75% improvement. No longer having choking or coughing. Denies GERD symptoms. Even stopped PPI and H2B and feels he is doing well. He denies any adverse side effects of TCA. Discussed increasing dose  to 25mg  QHS as he has had improvement and tolerating the medication well. Patient is agreeable to increasing dose, will make me aware of any adverse effects or if he is not tolerating higher dosage.   The patient was found to have elevated blood pressure when vital signs were checked in the office. The blood pressure was  rechecked by the nursing staff and it was found be persistently elevated >140/90 mmHg. I personally advised to the patient to follow up closely with his PCP for hypertension control, has upcoming appt on 12/18 and will discuss with them then.    PLAN:  Increase amitriptyline to 25mg  QHS 2. Pt to make me aware of any adverse SEs of TCA 3. Follow up with PCP regarding HTN  All questions were answered, patient verbalized understanding and is in agreement with plan as outlined above.    Follow Up: 3 months   Kryssa Risenhoover L. Alver Sorrow, MSN, APRN, AGNP-C Adult-Gerontology Nurse Practitioner [facility]  I have reviewed the note and agree with the APP's assessment as described in this progress note  Maylon Peppers, MD Gastroenterology and Hepatology [facility]

## 2022-07-23 NOTE — Addendum Note (Signed)
Addended by: Dolores Frame on: 07/23/2022 04:49 PM   Modules accepted: Level of Service

## 2022-08-22 ENCOUNTER — Other Ambulatory Visit (INDEPENDENT_AMBULATORY_CARE_PROVIDER_SITE_OTHER): Payer: Self-pay

## 2022-08-22 MED ORDER — AMITRIPTYLINE HCL 25 MG PO TABS: 25.0000 mg | ORAL_TABLET | Freq: Every day | ORAL | 0 refills | Status: DC

## 2022-10-23 ENCOUNTER — Ambulatory Visit (INDEPENDENT_AMBULATORY_CARE_PROVIDER_SITE_OTHER): Payer: 59 | Admitting: Gastroenterology

## 2022-10-29 ENCOUNTER — Other Ambulatory Visit (INDEPENDENT_AMBULATORY_CARE_PROVIDER_SITE_OTHER): Payer: Self-pay | Admitting: Gastroenterology

## 2022-12-12 ENCOUNTER — Encounter (INDEPENDENT_AMBULATORY_CARE_PROVIDER_SITE_OTHER): Payer: Self-pay | Admitting: Gastroenterology

## 2022-12-17 ENCOUNTER — Encounter (HOSPITAL_COMMUNITY): Payer: Self-pay | Admitting: Physical Therapy

## 2022-12-17 ENCOUNTER — Ambulatory Visit (HOSPITAL_COMMUNITY): Payer: 59 | Attending: Orthopedic Surgery | Admitting: Physical Therapy

## 2022-12-17 DIAGNOSIS — M25572 Pain in left ankle and joints of left foot: Secondary | ICD-10-CM | POA: Insufficient documentation

## 2022-12-17 NOTE — Therapy (Signed)
OUTPATIENT PHYSICAL THERAPY LOWER EXTREMITY EVALUATION   Patient Name: [name] Boyle MRN: [id number] DOB:[birth year], 72 y.o., male Today's Date: 12/17/2022  END OF SESSION:  PT End of Session - 12/17/22 1418     Visit Number 1    Number of Visits 8    Date for PT Re-Evaluation 01/16/23    Authorization Type [employer]    PT Start Time 1314   pt late   PT Stop Time 1345    PT Time Calculation (min) 31 min    Activity Tolerance Patient tolerated treatment well    Behavior During Therapy WFL for tasks assessed/performed             Past Medical History:  Diagnosis Date   Diabetes mellitus without complication    GERD (gastroesophageal reflux disease)    High cholesterol    Hypertension    Past Surgical History:  Procedure Laterality Date   COLONOSCOPY N/A 03/26/2019   Procedure: COLONOSCOPY;  Surgeon: Malissa Hippoehman, Najeeb U, MD;  [facility];  Service: Endoscopy;  Laterality: N/A;  12:00   ESOPHAGOGASTRODUODENOSCOPY N/A 08/28/2018   Procedure: ESOPHAGOGASTRODUODENOSCOPY (EGD);  Surgeon: Malissa Hippoehman, Najeeb U, MD;  [facility];  Service: Endoscopy;  Laterality: N/A;  2:00   ESOPHAGOGASTRODUODENOSCOPY (EGD) WITH PROPOFOL N/A 03/20/2022   Procedure: ESOPHAGOGASTRODUODENOSCOPY (EGD) WITH PROPOFOL;  Surgeon: Dolores Frameastaneda Mayorga, Daniel, MD;  [facility];  Service: Gastroenterology;  Laterality: N/A;  155 ASA 2   EXPLORATORY LAPAROTOMY  07/2017   ? [facility] [place]    KNEE SURGERY     LAPAROTOMY N/A 07/21/2018   Procedure: EXPLORATORY LAPAROTOMY, SMALL BOWEL RESECTION;  Surgeon: Lucretia RoersBridges, Lindsay C, MD;  Location: [facility];  Service: General;  Laterality: N/A;   LYSIS OF ADHESION N/A 07/21/2018   Procedure: LYSIS OF ADHESION;  Surgeon: Lucretia RoersBridges, Lindsay C, MD;  Location: [facility];  Service: General;  Laterality: N/A;   Patient Active Problem List   Diagnosis Date Noted   Globus sensation 05/24/2022   History of colonic polyps 02/18/2019    Intestinal adhesions with partial obstruction    Hypertension 07/17/2018   Type 2 diabetes mellitus 07/17/2018   Hyperlipidemia 07/17/2018   SBO (small bowel obstruction) 07/08/2018   Gastroesophageal reflux disease without esophagitis 06/20/2018   Small bowel obstruction 06/09/2018    PCP: Pearson GrippeJames Kim  REFERRING PROVIDER: Frederico Hammanaffrey, Daniel, MD  REFERRING DIAG:  Diagnosis Description  lt ankle sprain w/posterior tibial tendon pain    THERAPY DIAG:  Pain in left ankle and joints of left foot - Plan: PT plan of care cert/re-cert  Rationale for Evaluation and Treatment: Rehabilitation  ONSET DATE: 08/03/22    SUBJECTIVE STATEMENT: PT states that he sprained his ankle while playing tennis last Thanksgiving and has had pain on the inside of his left ankle ever since.  He has avoided all activity including walking.  PERTINENT HISTORY: HTN,  PAIN:  Are you having pain? Yes: NPRS scale: 0/10; worst pain is a 3/10; mornings the pt has significant stiffness and some swelling.  Pain location: medial aspect of Lt ankle.  Pain description: dull aching  Aggravating factors: wt bearing  Relieving factors: getting off it  PRECAUTIONS: None  WEIGHT BEARING RESTRICTIONS: No  FALLS:  Has patient fallen in last 6 months? No  LIVING ENVIRONMENT: Lives with: lives with their family Lives in: House/apartment Stairs: Yes: External: 5 steps; on right going up OCCUPATION: retired  PLOF: Independent; played tennis 3 x a week has not  played since injury   PATIENT GOALS: to play tennis again   NEXT MD VISIT: if needed   OBJECTIVE:   DIAGNOSTIC FINDINGS: done at MD office.   PATIENT SURVEYS:  FOTO 69  COGNITION: Overall cognitive status: Within functional limits for tasks assessed     SENSATION: WFL  EDEMA:  Figure 8: RT 21 1/4 LT:  21 1/2   PALPATION: Tender to palpation along tibiotalar ligament.   LOWER EXTREMITY ROM: Ankle ROM is equal bilateral but pt feels pain  along inside of his ankle.   LOWER EXTREMITY MMT:  MMT Right eval Left eval  Hip flexion    Hip extension    Hip abduction    Hip adduction    Hip internal rotation    Hip external rotation    Knee flexion    Knee extension    Ankle dorsiflexion 5 5  Ankle plantarflexion 5 5  Ankle inversion 5 5  Ankle eversion 5 5   (Blank rows = not tested)   FUNCTIONAL TESTS:  30 seconds chair stand test:  13 average for age and sex.  Single leg stance:  Rt: 3"  , LT" 3"      TODAY'S TREATMENT:                                                                                                                              DATE: 12/17/22 Evaluation   Gastroc stretch 3 x30" Ankle alphabet x 1 Resisted ankle inversion with green theraband x 10    PATIENT EDUCATION:  Education details: HEP Person educated: Patient Education method: Explanation, Verbal cues, and Handouts Education comprehension: verbalized understanding  HOME EXERCISE PROGRAM: Access Code: [email or web address] URL: [email or web address] Date: 12/17/2022 Prepared by: Virgina Organ  Exercises - Seated Ankle Alphabet  - 2 x daily - 7 x weekly - 1 sets - 3 reps - Gastroc Stretch on Wall  - 2 x daily - 7 x weekly - 1 sets - 3 reps - 30" hold - Seated Figure 4 Ankle Inversion with Resistance  - 2 x daily - 7 x weekly - 1 sets - 10 reps - 1 hold  ASSESSMENT:  CLINICAL IMPRESSION: Patient is a 72 y.o. male who was seen today for physical therapy evaluation and treatment for Lt ankle pain following a sprain over four months ago.  Evaluation demonstrates point tenderness, slight swelling, decreased balance, decreased activity tolerance and increased pain.  Therapist gave pt a starting HEP and encouraged him to begin walking daily until he can walk for an hour without increased pain.  [name] Boyle will benefit from skilled pt to address these issues and return him to his regular activity level of playing tennis three times a  day.    OBJECTIVE IMPAIRMENTS: decreased activity tolerance, decreased balance, increased edema, and pain.   ACTIVITY LIMITATIONS: locomotion level and playing tennis   PARTICIPATION LIMITATIONS: shopping, community activity, and tenis   REHAB POTENTIAL: Good  CLINICAL DECISION MAKING: Stable/uncomplicated  EVALUATION COMPLEXITY: Low   GOALS: Goals reviewed with patient? No  SHORT TERM GOALS: Target date: 12/31/22 PT to be I in HEP to decrease his Lt ankle pain down to no greater than a 2 Baseline: Goal status: INITIAL  2.  PT to be able to walk for 20 minutes without having increased pain .  Baseline:  Goal status: INITIAL    LONG TERM GOALS: Target date: 01/16/23  Pt to be I in advanced HEP in order to have his pain no greater than a 1/10 in his Lt ankle after a days activity.  Baseline:  Goal status: INITIAL  2.  PT to be able to single leg stance for 10" on each leg to improve balance for safety of returning to play tennis.  Baseline:  Goal status: INITIAL  3.  PT to have weaned himself out of his ankle brace.  Baseline:  Goal status: INITIAL    PLAN:  PT FREQUENCY: 2x/week  PT DURATION: 4 weeks  PLANNED INTERVENTIONS: Therapeutic exercises, Neuromuscular re-education, Balance training, Patient/Family education, Self Care, and Manual therapy  PLAN FOR NEXT SESSION: begin slant board stretch, plantar stretch, single leg stance, side shuffle, step up's progressing as able   Virgina Organ, PT CLT [phone]  12/17/2022, 1:45  PM

## 2022-12-19 ENCOUNTER — Ambulatory Visit (HOSPITAL_COMMUNITY): Payer: 59 | Admitting: Physical Therapy

## 2022-12-19 DIAGNOSIS — M25572 Pain in left ankle and joints of left foot: Secondary | ICD-10-CM | POA: Diagnosis not present

## 2022-12-19 NOTE — Therapy (Signed)
OUTPATIENT PHYSICAL THERAPY TREATMENT   Patient Name: [name] GermanJames Loredo MRN: [id number] DOB:09/20/1950, 72 y.o., male Today's Date: 12/19/2022  END OF SESSION:  PT End of Session - 12/19/22 1559     Visit Number 2    Number of Visits 8    Date for PT Re-Evaluation 01/16/23    Authorization Type [employer]    PT Start Time 1600    PT Stop Time 1640    PT Time Calculation (min) 40 min    Activity Tolerance Patient tolerated treatment well    Behavior During Therapy WFL for tasks assessed/performed             Past Medical History:  Diagnosis Date   Diabetes mellitus without complication    GERD (gastroesophageal reflux disease)    High cholesterol    Hypertension    Past Surgical History:  Procedure Laterality Date   COLONOSCOPY N/A 03/26/2019   Procedure: COLONOSCOPY;  Surgeon: Malissa Hippoehman, Najeeb U, MD;  [facility];  Service: Endoscopy;  Laterality: N/A;  12:00   ESOPHAGOGASTRODUODENOSCOPY N/A 08/28/2018   Procedure: ESOPHAGOGASTRODUODENOSCOPY (EGD);  Surgeon: Malissa Hippoehman, Najeeb U, MD;  [facility];  Service: Endoscopy;  Laterality: N/A;  2:00   ESOPHAGOGASTRODUODENOSCOPY (EGD) WITH PROPOFOL N/A 03/20/2022   Procedure: ESOPHAGOGASTRODUODENOSCOPY (EGD) WITH PROPOFOL;  Surgeon: Dolores Frameastaneda Mayorga, Daniel, MD;  [facility];  Service: Gastroenterology;  Laterality: N/A;  155 ASA 2   EXPLORATORY LAP[facility]OTOMY  07/2017   ? [facility]    KNEE SURGERY     LAPAROTOMY N/A 07/21/2018   Procedure: EXPLORATORY LAPAROTOMY, SMALL BOWEL RESECTION;  Surgeon: Lucretia RoersBridges, Lindsay C, MD;  Location: [facility];  Service: General;  Laterality: N/A;   LYSIS OF ADHESION N/A 07/21/2018   Procedure: LYSIS OF ADHESION;  Surgeon: Lucretia RoersBridges, Lindsay C, MD;  Location: [facility];  Service: General;  Laterality: N/A;   Patient Active Problem List   Diagnosis Date Noted   Globus sensation 05/24/2022   History of colonic polyps 02/18/2019   Intestinal adhesions  with partial obstruction    Hypertension 07/17/2018   Type 2 diabetes mellitus 07/17/2018   Hyperlipidemia 07/17/2018   SBO (small bowel obstruction) 07/08/2018   Gastroesophageal reflux disease without esophagitis 06/20/2018   Small bowel obstruction 06/09/2018    PCP: Pearson GrippeJames Kim  REFERRING PROVIDER: Frederico Hammanaffrey, Daniel, MD  REFERRING DIAG:  Diagnosis Description  lt ankle sprain w/posterior tibial tendon pain    THERAPY DIAG:  Pain in left ankle and joints of left foot  Rationale for Evaluation and Treatment: Rehabilitation  ONSET DATE: 08/03/22    SUBJECTIVE STATEMENT: PT states he has some soreness in his Lt ankle but no pain.  States he's been doing his HEP 2X each day.  Evaluation: PT states that he sprained his ankle while playing tennis last Thanksgiving and has had pain on the inside of his left ankle ever since.  He has avoided all activity including walking.  PERTINENT HISTORY: HTN,  PAIN:  Are you having pain? Yes: NPRS scale: 0/10; worst pain is a 3/10; mornings the pt has significant stiffness and some swelling.  Pain location: medial aspect of Lt ankle.  Pain description: dull aching  Aggravating factors: wt bearing  Relieving factors: getting off it  PRECAUTIONS: None  WEIGHT BEARING RESTRICTIONS: No  FALLS:  Has patient fallen in last 6 months? No  LIVING ENVIRONMENT: Lives with: lives with their family Lives in: House/apartment Stairs: Yes: External: 5 steps; on right going up  OCCUPATION: retired  PLOF: Independent; played tennis 3 x a week has not played since injury   PATIENT GOALS: to play tennis again   NEXT MD VISIT: if needed   OBJECTIVE:   DIAGNOSTIC FINDINGS: done at MD office.   PATIENT SURVEYS:  FOTO 69  COGNITION: Overall cognitive status: Within functional limits for tasks assessed     SENSATION: WFL  EDEMA:  Figure 8: RT 21 1/4 LT:  21 1/2   PALPATION: Tender to palpation along tibiotalar ligament.   LOWER  EXTREMITY ROM: Ankle ROM is equal bilateral but pt feels pain along inside of his ankle.   LOWER EXTREMITY MMT:  MMT Right eval Left eval  Hip flexion    Hip extension    Hip abduction    Hip adduction    Hip internal rotation    Hip external rotation    Knee flexion    Knee extension    Ankle dorsiflexion 5 5  Ankle plantarflexion 5 5  Ankle inversion 5 5  Ankle eversion 5 5   (Blank rows = not tested)   FUNCTIONAL TESTS:  30 seconds chair stand test:  13 average for age and sex.  Single leg stance:  Rt: 3"  , LT" 3"      TODAY'S TREATMENT:                                                                                                                              DATE:  12/19/22 Goal review Standing: Slant board stretch 3X30"  Plantar fascia stretch 3X30" with 4" box  Heel raises 20X  Toeraises 20X  Tandem stance 2X20" on foam  4" lateral step heel tap 2X10 each with 1 UE assist  4" forward step up 2X10 each with 1 UE assist  4" forward step down heel tap 2X10 each with 1 UE assist  SLS 5 trials each with max 18" Rt, 8" Lt  BAPS level 2 10X each direction with bil UE assist  Vectors 10X3" each LE with 1 UE assist  12/17/22 Evaluation   Gastroc stretch 3 x30" Ankle alphabet x 1 Resisted ankle inversion with green theraband x 10    PATIENT EDUCATION:  Education details: HEP Person educated: Patient Education method: Explanation, Verbal cues, and Handouts Education comprehension: verbalized understanding  HOME EXERCISE PROGRAM: Access Code: [email or web address] URL: [email or web address]  Date 12/19/22:  vectors 10X3" each LE, SLS each LE  Date: 12/17/2022 Prepared by: Virgina Organ Exercises - Seated Ankle Alphabet  - 2 x daily - 7 x weekly - 1 sets - 3 reps - Gastroc Stretch on Wall  - 2 x daily - 7 x weekly - 1 sets - 3 reps - 30" hold - Seated Figure 4 Ankle Inversion with Resistance  - 2 x daily - 7 x weekly - 1 sets - 10 reps - 1  hold  ASSESSMENT:  CLINICAL IMPRESSION: Reviewed goals and POC moving forward.  Pt able  to recall and demonstrate HEP given at evaluation.  Therapist had patient remove ankle brace during session today.  PT with questions regarding return to hitting from machine and using elliptical which PT assured would be fine at this point.  Noted weakness in hips when completing SLS bilaterally with inability to maintain greater than 18" on Rt and 8" on Lt without use of UE's to stabilize.  Vectors added to help improve this and instructed to work on this and SLS at home.  PT verbalized understanding and able to return instructions.  Pt will continue to benefit from skilled pt to address his deficits and return to his regular activity level of playing tennis three times a day.    OBJECTIVE IMPAIRMENTS: decreased activity tolerance, decreased balance, increased edema, and pain.   ACTIVITY LIMITATIONS: locomotion level and playing tennis   PARTICIPATION LIMITATIONS: shopping, community activity, and tenis   REHAB POTENTIAL: Good  CLINICAL DECISION MAKING: Stable/uncomplicated  EVALUATION COMPLEXITY: Low   GOALS: Goals reviewed with patient? Yes  SHORT TERM GOALS: Target date: 12/31/22 PT to be I in HEP to decrease his Lt ankle pain down to no greater than a 2 Baseline: Goal status: IN PROGRESS  2.  PT to be able to walk for 20 minutes without having increased pain .  Baseline:  Goal status: IN PROGRESS    LONG TERM GOALS: Target date: 01/16/23  Pt to be I in advanced HEP in order to have his pain no greater than a 1/10 in his Lt ankle after a days activity.  Baseline:  Goal status: IN PROGRESS  2.  PT to be able to single leg stance for 10" on each leg to improve balance for safety of returning to play tennis.  Baseline:  Goal status: IN PROGRESS  3.  PT to have weaned himself out of his ankle brace.  Baseline:  Goal status: IN PROGRESS    PLAN:  PT FREQUENCY: 2x/week  PT  DURATION: 4 weeks  PLANNED INTERVENTIONS: Therapeutic exercises, Neuromuscular re-education, Balance training, Patient/Family education, Self Care, and Manual therapy  PLAN FOR NEXT SESSION: Progress LE stabilization as able.   Lurena Nida, PTA/CLT [facility] Ph: [phone]   Lurena Nida, PTA 12/19/2022, 4:00 PM

## 2022-12-25 ENCOUNTER — Ambulatory Visit (HOSPITAL_COMMUNITY): Payer: 59

## 2022-12-25 ENCOUNTER — Encounter (HOSPITAL_COMMUNITY): Payer: Self-pay

## 2022-12-25 DIAGNOSIS — M25572 Pain in left ankle and joints of left foot: Secondary | ICD-10-CM | POA: Diagnosis not present

## 2022-12-25 NOTE — Therapy (Signed)
OUTPATIENT PHYSICAL THERAPY TREATMENT   Patient Name: [name] Boyle MRN: [id number] DOB:Jan 28, 1951, 72 y.o., male Today's Date: 12/25/2022  END OF SESSION:  PT End of Session - 12/25/22 1338     Visit Number 3    Number of Visits 8    Date for PT Re-Evaluation 01/16/23    Authorization Type [employer]    PT Start Time 1305    PT Stop Time 1344    PT Time Calculation (min) 39 min    Activity Tolerance Patient tolerated treatment well    Behavior During Therapy WFL for tasks assessed/performed              Past Medical History:  Diagnosis Date   Diabetes mellitus without complication    GERD (gastroesophageal reflux disease)    High cholesterol    Hypertension    Past Surgical History:  Procedure Laterality Date   COLONOSCOPY N/A 03/26/2019   Procedure: COLONOSCOPY;  Surgeon: Malissa Hippo, MD;  [facility];  Service: Endoscopy;  Laterality: N/A;  12:00   ESOPHAGOGASTRODUODENOSCOPY N/A 08/28/2018   Procedure: ESOPHAGOGASTRODUODENOSCOPY (EGD);  Surgeon: Malissa Hippo, MD;  [facility];  Service: Endoscopy;  Laterality: N/A;  2:00   ESOPHAGOGASTRODUODENOSCOPY (EGD) WITH PROPOFOL N/A 03/20/2022   Procedure: ESOPHAGOGASTRODUODENOSCOPY (EGD) WITH PROPOFOL;  Surgeon: Dolores Frame, MD;  [facility];  Service: Gastroenterology;  Laterality: N/A;  155 ASA 2   EXPLORATORY LAPAROTOMY  07/2017   ? [facility]rotation[facility]    KNEE SURGERY     LAPAROTOMY N/A 07/21/2018   Procedure: EXPLORATORY LAPAROTOMY, SMALL BOWEL RESECTION;  Surgeon: [name], MD;  Location: [facility];  Service: General;  Laterality: N/A;   LYSIS OF ADHESION N/A 07/21/2018   Procedure: LYSIS OF ADHESION;  Surgeon: [name], MD;  Location: [facility];  Service: General;  Laterality: N/A;   Patient Active Problem List   Diagnosis Date Noted   Globus sensation 05/24/2022   History of colonic polyps 02/18/2019   Intestinal  adhesions with partial obstruction    Hypertension 07/17/2018   Type 2 diabetes mellitus 07/17/2018   Hyperlipidemia 07/17/2018   SBO (small bowel obstruction) 07/08/2018   Gastroesophageal reflux disease without esophagitis 06/20/2018   Small bowel obstruction 06/09/2018    PCP: Pearson Grippe  REFERRING PROVIDER: Frederico Hamman, MD  REFERRING DIAG:  Diagnosis Description  lt ankle sprain w/posterior tibial tendon pain    THERAPY DIAG:  Pain in left ankle and joints of left foot  Rationale for Evaluation and Treatment: Rehabilitation  ONSET DATE: 08/03/22    SUBJECTIVE STATEMENT: Pt stated he is feeling good today, no reports of pain but doesn't feel 100% yet.  Stated compliance with HEP daily and practiced a tennis ball machine yesterday for 45 minutes without issues.  Reports minor swelling in ankle following weight bearing.  Evaluation: PT states that he sprained his ankle while playing tennis last Thanksgiving and has had pain on the inside of his left ankle ever since.  He has avoided all activity including walking.  PERTINENT HISTORY: HTN,  PAIN:  Are you having pain? Yes: NPRS scale: 0/10; worst pain is a 3/10; mornings the pt has significant stiffness and some swelling.  Pain location: medial aspect of Lt ankle.  Pain description: dull aching  Aggravating factors: wt bearing  Relieving factors: getting off it  PRECAUTIONS: None  WEIGHT BEARING RESTRICTIONS: No  FALLS:  Has patient fallen in last 6 months? No  LIVING ENVIRONMENT: Lives with: lives with their family Lives in: House/apartment Stairs: Yes: External: 5 steps; on right going up OCCUPATION: retired  PLOF: Independent; played tennis 3 x a week has not played since injury   PATIENT GOALS: to play tennis again   NEXT MD VISIT: if needed   OBJECTIVE:   DIAGNOSTIC FINDINGS: done at MD office.   PATIENT SURVEYS:  FOTO 69  COGNITION: Overall cognitive status: Within functional limits for  tasks assessed     SENSATION: WFL  EDEMA:  Figure 8: RT 21 1/4 LT:  21 1/2   PALPATION: Tender to palpation along tibiotalar ligament.   LOWER EXTREMITY ROM: Ankle ROM is equal bilateral but pt feels pain along inside of his ankle.   LOWER EXTREMITY MMT:  MMT Right eval Left eval  Hip flexion    Hip extension    Hip abduction    Hip adduction    Hip internal rotation    Hip external rotation    Knee flexion    Knee extension    Ankle dorsiflexion 5 5  Ankle plantarflexion 5 5  Ankle inversion 5 5  Ankle eversion 5 5   (Blank rows = not tested)   FUNCTIONAL TESTS:  30 seconds chair stand test:  13 average for age and sex.  Single leg stance:  Rt: 3"  , LT" 3"      TODAY'S TREATMENT:                                                                                                                              DATE:  12/25/22: Standing:  Heel raises 20X incline slope  Toeraises 20X  SLS Lt 6", Rt 5"  Vector stance 5x 5" on foam with 1 HHA  Tandem stance on foam 2x 30", 1 x 5 reps with head rotation  Body craft  lateral step out 5Pl 10x each  BAPS L2 5x each (DF/PF, Inv/Ev, CW/CCW)  Slant board 3x 30"  Squat then heel raise 10x 12/19/22 Goal review Standing: Slant board stretch 3X30"  Plantar fascia stretch 3X30" with 4" box  Heel raises 20X  Toeraises 20X  Tandem stance 2X20" on foam  4" lateral step heel tap 2X10 each with 1 UE assist  4" forward step up 2X10 each with 1 UE assist  4" forward step down heel tap 2X10 each with 1 UE assist  SLS 5 trials each with max 18" Rt, 8" Lt  BAPS level 2 10X each direction with bil UE assist  Vectors 10X3" each LE with 1 UE assist  12/17/22 Evaluation   Gastroc stretch 3 x30" Ankle alphabet x 1 Resisted ankle inversion with green theraband x 10    PATIENT EDUCATION:  Education details: HEP Person educated: Patient Education method: Explanation, Verbal cues, and Handouts Education comprehension: verbalized  understanding  HOME EXERCISE PROGRAM: Access Code: [email or web address] URL: [email or web address]  12/25/22: squat to heel raise  Date 12/19/22:  vectors  10X3" each LE, SLS each LE  Date: 12/17/2022 Prepared by: Virgina Organ Exercises - Seated Ankle Alphabet  - 2 x daily - 7 x weekly - 1 sets - 3 reps - Gastroc Stretch on Wall  - 2 x daily - 7 x weekly - 1 sets - 3 reps - 30" hold - Seated Figure 4 Ankle Inversion with Resistance  - 2 x daily - 7 x weekly - 1 sets - 10 reps - 1 hold  ASSESSMENT:  CLINICAL IMPRESSION: Session focus with hip and ankle strengthening and stability exercises.  Pt most limited with SLS and dynamic balance training required intermittent HHA.  Added lateral step machines with cueing for control.  Pt tolerated well to session with no reports of increased pain, was limited by fatigue and hyperhidrosis. OBJECTIVE IMPAIRMENTS: decreased activity tolerance, decreased balance, increased edema, and pain.   ACTIVITY LIMITATIONS: locomotion level and playing tennis   PARTICIPATION LIMITATIONS: shopping, community activity, and tenis   REHAB POTENTIAL: Good  CLINICAL DECISION MAKING: Stable/uncomplicated  EVALUATION COMPLEXITY: Low   GOALS: Goals reviewed with patient? Yes  SHORT TERM GOALS: Target date: 12/31/22 PT to be I in HEP to decrease his Lt ankle pain down to no greater than a 2 Baseline: Goal status: IN PROGRESS  2.  PT to be able to walk for 20 minutes without having increased pain .  Baseline:  Goal status: IN PROGRESS    LONG TERM GOALS: Target date: 01/16/23  Pt to be I in advanced HEP in order to have his pain no greater than a 1/10 in his Lt ankle after a days activity.  Baseline:  Goal status: IN PROGRESS  2.  PT to be able to single leg stance for 10" on each leg to improve balance for safety of returning to play tennis.  Baseline:  Goal status: IN PROGRESS  3.  PT to have weaned himself out of his ankle brace.  Baseline:   Goal status: IN PROGRESS    PLAN:  PT FREQUENCY: 2x/week  PT DURATION: 4 weeks  PLANNED INTERVENTIONS: Therapeutic exercises, Neuromuscular re-education, Balance training, Patient/Family education, Self Care, and Manual therapy  PLAN FOR NEXT SESSION: Progress LE stabilization as able.  Add fast side shuffle, braiding and progress ankle stability with dynamic balance activities.     Becky Sax, LPTA/CLT; CBIS [phone]  Juel Burrow, PTA 12/25/2022, 2:12 PM

## 2022-12-28 ENCOUNTER — Ambulatory Visit (HOSPITAL_COMMUNITY): Payer: 59

## 2022-12-28 ENCOUNTER — Encounter (HOSPITAL_COMMUNITY): Payer: Self-pay

## 2022-12-28 DIAGNOSIS — M25572 Pain in left ankle and joints of left foot: Secondary | ICD-10-CM | POA: Diagnosis not present

## 2022-12-28 NOTE — Therapy (Signed)
OUTPATIENT PHYSICAL THERAPY TREATMENT   Patient Name: [name] Boyle MRN: [id number] DOB:15-Nov-1950, 72 y.o., male Today's Date: 12/28/2022  END OF SESSION:  PT End of Session - 12/28/22 1537     Visit Number 4    Number of Visits 8    Date for PT Re-Evaluation 01/16/23    Authorization Type [employer]    PT Start Time 1447    PT Stop Time 1525    PT Time Calculation (min) 38 min    Activity Tolerance Patient tolerated treatment well    Behavior During Therapy WFL for tasks assessed/performed               Past Medical History:  Diagnosis Date   Diabetes mellitus without complication    GERD (gastroesophageal reflux disease)    High cholesterol    Hypertension    Past Surgical History:  Procedure Laterality Date   COLONOSCOPY N/A 03/26/2019   Procedure: COLONOSCOPY;  Surgeon: Malissa Hippo, MD;  [facility]: [facility];  Service: Endoscopy;  Laterality: N/A;  12:00   ESOPHAGOGASTRODUODENOSCOPY N/A 08/28/2018   Procedure: ESOPHAGOGASTRODUODENOSCOPY (EGD);  Surgeon: Malissa Hippo, MD;  Location: [facility];  Service: Endoscopy;  Laterality: N/A;  2:00   ESOPHAGOGASTRODUODENOSCOPY (EGD) WITH PROPOFOL N/A 03/20/2022   Procedure: ESOPHAGOGASTRODUODENOSCOPY (EGD) WITH PROPOFOL;  Surgeon: Dolores Frame, MD;  Location: [facility];  Service: Gastroenterology;  Laterality: N/A;  155 ASA 2   EXPLORATORY LAPAROTOMY  07/2017   ? partial malrotation- [facility]    KNEE SURGERY     LAPAROTOMY N/A 07/21/2018   Procedure: EXPLORATORY LAPAROTOMY, SMALL BOWEL RESECTION;  Surgeon: [name], MD;  Location: [facility];  Service: General;  Laterality: N/A;   LYSIS OF ADHESION N/A 07/21/2018   Procedure: LYSIS OF ADHESION;  Surgeon: [name], MD;  Location: [facility];  Service: General;  Laterality: N/A;   Patient Active Problem List   Diagnosis Date Noted   Globus sensation 05/24/2022   History of colonic polyps 02/18/2019   Intestinal  adhesions with partial obstruction    Hypertension 07/17/2018   Type 2 diabetes mellitus 07/17/2018   Hyperlipidemia 07/17/2018   SBO (small bowel obstruction) 07/08/2018   Gastroesophageal reflux disease without esophagitis 06/20/2018   Small bowel obstruction 06/09/2018    PCP: Pearson Grippe  REFERRING PROVIDER: Frederico Hamman, MD  REFERRING DIAG:  Diagnosis Description  lt ankle sprain w/posterior tibial tendon pain    THERAPY DIAG:  Pain in left ankle and joints of left foot  Rationale for Evaluation and Treatment: Rehabilitation  ONSET DATE: 08/03/22    SUBJECTIVE STATEMENT: Pt stated he was able to complete 45 minutes on tennis ball machine today without issues.  Has been doing his HEP daily.  Evaluation: PT states that he sprained his ankle while playing tennis last Thanksgiving and has had pain on the inside of his left ankle ever since.  He has avoided all activity including walking.  PERTINENT HISTORY: HTN,  PAIN:  Are you having pain? Yes: NPRS scale: 0/10; worst pain is a 3/10; mornings the pt has significant stiffness and some swelling.  Pain location: medial aspect of Lt ankle.  Pain description: dull aching  Aggravating factors: wt bearing  Relieving factors: getting off it  PRECAUTIONS: None  WEIGHT BEARING RESTRICTIONS: No  FALLS:  Has patient fallen in last 6 months? No  LIVING ENVIRONMENT: Lives with: lives with their family Lives in: House/apartment Stairs: Yes: External: 5 steps; on right  going up OCCUPATION: retired  PLOF: Independent; played tennis 3 x a week has not played since injury   PATIENT GOALS: to play tennis again   NEXT MD VISIT: if needed   OBJECTIVE:   DIAGNOSTIC FINDINGS: done at MD office.   PATIENT SURVEYS:  FOTO 69  COGNITION: Overall cognitive status: Within functional limits for tasks assessed     SENSATION: WFL  EDEMA:  Figure 8: RT 21 1/4 LT:  21 1/2   PALPATION: Tender to palpation along tibiotalar  ligament.   LOWER EXTREMITY ROM: Ankle ROM is equal bilateral but pt feels pain along inside of his ankle.   LOWER EXTREMITY MMT:  MMT Right eval Left eval  Hip flexion    Hip extension    Hip abduction    Hip adduction    Hip internal rotation    Hip external rotation    Knee flexion    Knee extension    Ankle dorsiflexion 5 5  Ankle plantarflexion 5 5  Ankle inversion 5 5  Ankle eversion 5 5   (Blank rows = not tested)   FUNCTIONAL TESTS:  30 seconds chair stand test:  13 average for age and sex.  Single leg stance:  Rt: 3"  , LT" 3"      TODAY'S TREATMENT:                                                                                                                              DATE:  12/28/22 Standing:  Squat to heel raise 15x 3-5" holds each  SLS 5" max of 5  Vector stance 5x 5" on foam with 1 HHA  Tandem stance 3x 30"  Tandem gait 5RT  Tandem gait on foam 5RT  Bodycraft 10 squat fast shuffle sidestep 4Pl both directions  Braiding 5RT  3 Cone tapping 5RT SLS  Jogging and fast turns 3RT down hallway   12/25/22: Standing:  Heel raises 20X incline slope  Toeraises 20X  SLS Lt 6", Rt 5"  Vector stance 5x 5" on foam with 1 HHA  Tandem stance on foam 2x 30", 1 x 5 reps with head rotation  Body craft  lateral step out 5Pl 10x each  BAPS L2 5x each (DF/PF, Inv/Ev, CW/CCW)  Slant board 3x 30"  Squat then heel raise 10x 12/19/22 Goal review Standing: Slant board stretch 3X30"  Plantar fascia stretch 3X30" with 4" box  Heel raises 20X  Toeraises 20X  Tandem stance 2X20" on foam  4" lateral step heel tap 2X10 each with 1 UE assist  4" forward step up 2X10 each with 1 UE assist  4" forward step down heel tap 2X10 each with 1 UE assist  SLS 5 trials each with max 18" Rt, 8" Lt  BAPS level 2 10X each direction with bil UE assist  Vectors 10X3" each LE with 1 UE assist  12/17/22 Evaluation   Gastroc stretch 3 x30" Ankle alphabet x  1 Resisted ankle inversion  with green theraband x 10    PATIENT EDUCATION:  Education details: HEP Person educated: Patient Education method: Explanation, Verbal cues, and Handouts Education comprehension: verbalized understanding  HOME EXERCISE PROGRAM: Access Code: [email or web address] URL: [email or web address] 12/28/22:  increase hold time with vectors and reduce HHA  12/25/22: squat to heel raise  Date 12/19/22:  vectors 10X3" each LE, SLS each LE  Date: 12/17/2022 Prepared by: Virgina Organ Exercises - Seated Ankle Alphabet  - 2 x daily - 7 x weekly - 1 sets - 3 reps - Gastroc Stretch on Wall  - 2 x daily - 7 x weekly - 1 sets - 3 reps - 30" hold - Seated Figure 4 Ankle Inversion with Resistance  - 2 x daily - 7 x weekly - 1 sets - 10 reps - 1 hold  ASSESSMENT:  CLINICAL IMPRESSION: Added dynamic balance activities and ankle stability exercises.  Pt continues to have difficulty with SLS and dynamic stance activities.  Pt reports ability to practice for 45 minutes on tennis ball machine and ability to walk an hour with no pain.  Is no longer wearing brace.    OBJECTIVE IMPAIRMENTS: decreased activity tolerance, decreased balance, increased edema, and pain.   ACTIVITY LIMITATIONS: locomotion level and playing tennis   PARTICIPATION LIMITATIONS: shopping, community activity, and tenis   REHAB POTENTIAL: Good  CLINICAL DECISION MAKING: Stable/uncomplicated  EVALUATION COMPLEXITY: Low   GOALS: Goals reviewed with patient? Yes  SHORT TERM GOALS: Target date: 12/31/22 PT to be I in HEP to decrease his Lt ankle pain down to no greater than a 2 Baseline:  12/28/22:  Reports compliance with HEP Goal status: IN PROGRESS  2.  PT to be able to walk for 20 minutes without having increased pain .  Baseline: 12/28/22: Feels he can walk a mile without pain. Goal status: IN PROGRESS    LONG TERM GOALS: Target date: 01/16/23  Pt to be I in advanced HEP in order to have his pain no greater than a 1/10  in his Lt ankle after a days activity.  Baseline:  Goal status: IN PROGRESS  2.  PT to be able to single leg stance for 10" on each leg to improve balance for safety of returning to play tennis.  Baseline:  12/28/22: 3-5" max Goal status: IN PROGRESS  3.  PT to have weaned himself out of his ankle brace.  Baseline: 12/28/22: Reports completely weaned Goal status: IN PROGRESS    PLAN:  PT FREQUENCY: 2x/week  PT DURATION: 4 weeks  PLANNED INTERVENTIONS: Therapeutic exercises, Neuromuscular re-education, Balance training, Patient/Family education, Self Care, and Manual therapy  PLAN FOR NEXT SESSION: Progress LE stabilization as able. Progress SLS and balance training.  Add paloff in tandem stance with step.  Encourage pt to practice non-aggressive volling.   Becky Sax, LPTA/CLT; CBIS [phone]  Juel Burrow, PTA 12/28/2022, 3:39 PM

## 2023-01-01 ENCOUNTER — Encounter (HOSPITAL_COMMUNITY): Payer: Self-pay

## 2023-01-01 ENCOUNTER — Ambulatory Visit (HOSPITAL_COMMUNITY): Payer: 59

## 2023-01-01 DIAGNOSIS — M25572 Pain in left ankle and joints of left foot: Secondary | ICD-10-CM

## 2023-01-01 NOTE — Therapy (Signed)
OUTPATIENT PHYSICAL THERAPY TREATMENT   Patient Name: [name] Boyle MRN: [id number] DOB:1951-02-07, 72 y.o., male Today's Date: 01/01/2023  END OF SESSION:  PT End of Session - 01/01/23 1344     Visit Number 5    Number of Visits 8    Date for PT Re-Evaluation 01/16/23    Authorization Type [employer]    PT Start Time 1304    PT Stop Time 1344    PT Time Calculation (min) 40 min    Activity Tolerance Patient tolerated treatment well    Behavior During Therapy WFL for tasks assessed/performed                Past Medical History:  Diagnosis Date   Diabetes mellitus without complication    GERD (gastroesophageal reflux disease)    High cholesterol    Hypertension    Past Surgical History:  Procedure Laterality Date   COLONOSCOPY N/A 03/26/2019   Procedure: COLONOSCOPY;  Surgeon: Malissa Hippo, MD;  [facility];  Service: Endoscopy;  Laterality: N/A;  12:00   ESOPHAGOGASTRODUODENOSCOPY N/A 08/28/2018   Procedure: ESOPHAGOGASTRODUODENOSCOPY (EGD);  Surgeon: Malissa Hippo, MD;  [facility]: [facility];  Service: Endoscopy;  Laterality: N/A;  2:00   ESOPHAGOGASTRODUODENOSCOPY (EGD) WITH PROPOFOL N/A 03/20/2022   Procedure: ESOPHAGOGASTRODUODENOSCOPY (EGD) WITH PROPOFOL;  Surgeon: Dolores Frame, MD;  [facility]: [facility];  Service: Gastroenterology;  Laterality: N/A;  155 ASA 2   EXPLORATORY LAPAROTOMY  07/2017   ? partial malrotation- [facility]    KNEE SURGERY     LAPAROTOMY N/A 07/21/2018   Procedure: EXPLORATORY LAPAROTOMY, SMALL BOWEL RESECTION;  Surgeon: [name], MD;  Location: [facility];  Service: General;  Laterality: N/A;   LYSIS OF ADHESION N/A 07/21/2018   Procedure: LYSIS OF ADHESION;  Surgeon: [name], MD;  Location: [facility];  Service: General;  Laterality: N/A;   Patient Active Problem List   Diagnosis Date Noted   Globus sensation 05/24/2022   History of colonic polyps 02/18/2019   Intestinal  adhesions with partial obstruction    Hypertension 07/17/2018   Type 2 diabetes mellitus 07/17/2018   Hyperlipidemia 07/17/2018   SBO (small bowel obstruction) 07/08/2018   Gastroesophageal reflux disease without esophagitis 06/20/2018   Small bowel obstruction 06/09/2018    PCP: Pearson Grippe  REFERRING PROVIDER: Frederico Hamman, MD  REFERRING DIAG:  Diagnosis Description  lt ankle sprain w/posterior tibial tendon pain    THERAPY DIAG:  Pain in left ankle and joints of left foot  Rationale for Evaluation and Treatment: Rehabilitation  ONSET DATE: 08/03/22    SUBJECTIVE STATEMENT: Pt stated he has increased pain and some swelling following the heel raises, stated he has completed 60 at a time.  Balance continues to be difficult.    Evaluation: PT states that he sprained his ankle while playing tennis last Thanksgiving and has had pain on the inside of his left ankle ever since.  He has avoided all activity including walking.  PERTINENT HISTORY: HTN,  PAIN:  Are you having pain? Yes: NPRS scale: 0/10; worst pain is a 2/10; mornings the pt has significant stiffness and some swelling.  Pain location: medial aspect of Lt ankle.  Pain description: dull aching  Aggravating factors: wt bearing  Relieving factors: getting off it  PRECAUTIONS: None  WEIGHT BEARING RESTRICTIONS: No  FALLS:  Has patient fallen in last 6 months? No  LIVING ENVIRONMENT: Lives with: lives with their family Lives in: House/apartment  Stairs: Yes: External: 5 steps; on right going up OCCUPATION: retired  PLOF: Independent; played tennis 3 x a week has not played since injury   PATIENT GOALS: to play tennis again   NEXT MD VISIT: if needed   OBJECTIVE:   DIAGNOSTIC FINDINGS: done at MD office.   PATIENT SURVEYS:  FOTO 69  COGNITION: Overall cognitive status: Within functional limits for tasks assessed     SENSATION: WFL  EDEMA:  Figure 8: RT 21 1/4 LT:  21  1/2   PALPATION: Tender to palpation along tibiotalar ligament.   LOWER EXTREMITY ROM: Ankle ROM is equal bilateral but pt feels pain along inside of his ankle.   LOWER EXTREMITY MMT:  MMT Right eval Left eval  Hip flexion    Hip extension    Hip abduction    Hip adduction    Hip internal rotation    Hip external rotation    Knee flexion    Knee extension    Ankle dorsiflexion 5 5  Ankle plantarflexion 5 5  Ankle inversion 5 5  Ankle eversion 5 5   (Blank rows = not tested)   FUNCTIONAL TESTS:  30 seconds chair stand test:  13 average for age and sex.  Single leg stance:  Rt: 3"  , LT" 3"      TODAY'S TREATMENT:                                                                                                                              DATE:  01/01/23: Standing:  Squat to heel raise 15x 3-5" holds each  SLS Lt 7" Rt 5" max of 5  Palloff with GTB one foot on cone 20x  Tandem stance on foam   Braiding 5RT  Quick shuffle with ball toss to wall 3RT down hallway  Balance beam side step on foam with min A  Tandem gait on foam 3RT  Leg press with plantar flexion 2x 10 3Pl then 5Pl  12/28/22 Standing:  Squat to heel raise 15x 3-5" holds each  SLS 5" max of 5  Vector stance 5x 5" on foam with 1 HHA  Tandem stance 3x 30"  Tandem gait 5RT  Tandem gait on foam 5RT  Bodycraft 10 squat fast shuffle sidestep 4Pl both directions  Braiding 5RT  3 Cone tapping 5RT SLS  Jogging and fast turns 3RT down hallway   12/25/22: Standing:  Heel raises 20X incline slope  Toeraises 20X  SLS Lt 6", Rt 5"  Vector stance 5x 5" on foam with 1 HHA  Tandem stance on foam 2x 30", 1 x 5 reps with head rotation  Body craft  lateral step out 5Pl 10x each  BAPS L2 5x each (DF/PF, Inv/Ev, CW/CCW)  Slant board 3x 30"  Squat then heel raise 10x 12/19/22 Goal review Standing: Slant board stretch 3X30"  Plantar fascia stretch 3X30" with 4" box  Heel raises 20X  Toeraises 20X  Tandem  stance 2X20" on foam  4" lateral step heel tap 2X10 each with 1 UE assist  4" forward step up 2X10 each with 1 UE assist  4" forward step down heel tap 2X10 each with 1 UE assist  SLS 5 trials each with max 18" Rt, 8" Lt  BAPS level 2 10X each direction with bil UE assist  Vectors 10X3" each LE with 1 UE assist  12/17/22 Evaluation   Gastroc stretch 3 x30" Ankle alphabet x 1 Resisted ankle inversion with green theraband x 10    PATIENT EDUCATION:  Education details: HEP Person educated: Patient Education method: Explanation, Verbal cues, and Handouts Education comprehension: verbalized understanding  HOME EXERCISE PROGRAM: Access Code: [email or web address] URL: [email or web address] 12/28/22:  increase hold time with vectors and reduce HHA  12/25/22: squat to heel raise  Date 12/19/22:  vectors 10X3" each LE, SLS each LE  Date: 12/17/2022 Prepared by: Virgina Organ Exercises - Seated Ankle Alphabet  - 2 x daily - 7 x weekly - 1 sets - 3 reps - Gastroc Stretch on Wall  - 2 x daily - 7 x weekly - 1 sets - 3 reps - 30" hold - Seated Figure 4 Ankle Inversion with Resistance  - 2 x daily - 7 x weekly - 1 sets - 10 reps - 1 hold  ASSESSMENT:  CLINICAL IMPRESSION: Encouraged to reduce intensity and reps vs 60 reps of heel raises at once and to monitor pain/swelling.  Educated on RICE techniques to assist with pain and swelling.  Session focus on ankle stability and balance activities with min A required on dynamic surfaces.  Encourage pt to practice non-aggressive volling.  OBJECTIVE IMPAIRMENTS: decreased activity tolerance, decreased balance, increased edema, and pain.   ACTIVITY LIMITATIONS: locomotion level and playing tennis   PARTICIPATION LIMITATIONS: shopping, community activity, and tenis   REHAB POTENTIAL: Good  CLINICAL DECISION MAKING: Stable/uncomplicated  EVALUATION COMPLEXITY: Low   GOALS: Goals reviewed with patient? Yes  SHORT TERM GOALS: Target  date: 12/31/22 PT to be I in HEP to decrease his Lt ankle pain down to no greater than a 2 Baseline:  12/28/22:  Reports compliance with HEP Goal status: IN PROGRESS  2.  PT to be able to walk for 20 minutes without having increased pain .  Baseline: 12/28/22: Feels he can walk a mile without pain. Goal status: IN PROGRESS    LONG TERM GOALS: Target date: 01/16/23  Pt to be I in advanced HEP in order to have his pain no greater than a 1/10 in his Lt ankle after a days activity.  Baseline:  Goal status: IN PROGRESS  2.  PT to be able to single leg stance for 10" on each leg to improve balance for safety of returning to play tennis.  Baseline:  12/28/22: 3-5" max Goal status: IN PROGRESS  3.  PT to have weaned himself out of his ankle brace.  Baseline: 12/28/22: Reports completely weaned Goal status: IN PROGRESS    PLAN:  PT FREQUENCY: 2x/week  PT DURATION: 4 weeks  PLANNED INTERVENTIONS: Therapeutic exercises, Neuromuscular re-education, Balance training, Patient/Family education, Self Care, and Manual therapy  PLAN FOR NEXT SESSION: Progress LE stabilization as able. Progress SLS and balance training.  Assess return to non-aggressive tennis went.   Becky Sax, LPTA/CLT; CBIS [phone]  Juel Burrow, PTA 01/01/2023, 2:20 PM

## 2023-01-04 ENCOUNTER — Ambulatory Visit (HOSPITAL_COMMUNITY): Payer: 59 | Admitting: Physical Therapy

## 2023-01-04 DIAGNOSIS — M25572 Pain in left ankle and joints of left foot: Secondary | ICD-10-CM

## 2023-01-04 NOTE — Therapy (Signed)
OUTPATIENT PHYSICAL THERAPY TREATMENT   Patient Name: [name] Boyle MRN: [id number] DOB:August 10, 1951, 72 y.o., male Today's Date: 01/04/2023  END OF SESSION:  PT End of Session - 01/04/23 1425     Visit Number 6    Number of Visits 8    Date for PT Re-Evaluation 01/16/23    Authorization Type [employer]    PT Start Time 1346    PT Stop Time 1425    PT Time Calculation (min) 39 min    Activity Tolerance Patient tolerated treatment well    Behavior During Therapy WFL for tasks assessed/performed                Past Medical History:  Diagnosis Date   Diabetes mellitus without complication (HCC)    GERD (gastroesophageal reflux disease)    High cholesterol    Hypertension    Past Surgical History:  Procedure Laterality Date   COLONOSCOPY N/A 03/26/2019   Procedure: COLONOSCOPY;  Surgeon: Malissa Hippo, MD;  [facility]: [facility];  Service: Endoscopy;  Laterality: N/A;  12:00   ESOPHAGOGASTRODUODENOSCOPY N/A 08/28/2018   Procedure: ESOPHAGOGASTRODUODENOSCOPY (EGD);  Surgeon: Malissa Hippo, MD;  [facility]: [facility];  Service: Endoscopy;  Laterality: N/A;  2:00   ESOPHAGOGASTRODUODENOSCOPY (EGD) WITH PROPOFOL N/A 03/20/2022   Procedure: ESOPHAGOGASTRODUODENOSCOPY (EGD) WITH PROPOFOL;  Surgeon: Dolores Frame, MD;  [facility]: [facility];  Service: Gastroenterology;  Laterality: N/A;  155 ASA 2   EXPLORATORY LAPAROTOMY  07/2017   ? partial malrotation- [facility]    KNEE SURGERY     LAPAROTOMY N/A 07/21/2018   Procedure: EXPLORATORY LAPAROTOMY, SMALL BOWEL RESECTION;  Surgeon: [name], MD;  Location: [facility];  Service: General;  Laterality: N/A;   LYSIS OF ADHESION N/A 07/21/2018   Procedure: LYSIS OF ADHESION;  Surgeon: [name], MD;  Location: [facility];  Service: General;  Laterality: N/A;   Patient Active Problem List   Diagnosis Date Noted   Globus sensation 05/24/2022   History of colonic polyps 02/18/2019    Intestinal adhesions with partial obstruction (HCC)    Hypertension 07/17/2018   Type 2 diabetes mellitus (HCC) 07/17/2018   Hyperlipidemia 07/17/2018   SBO (small bowel obstruction) (HCC) 07/08/2018   Gastroesophageal reflux disease without esophagitis 06/20/2018   Small bowel obstruction (HCC) 06/09/2018    PCP: Pearson Grippe  REFERRING PROVIDER: Frederico Hamman, MD  REFERRING DIAG:  Diagnosis Description  lt ankle sprain w/posterior tibial tendon pain    THERAPY DIAG:  Pain in left ankle and joints of left foot  Rationale for Evaluation and Treatment: Rehabilitation  ONSET DATE: 08/03/22    SUBJECTIVE STATEMENT:Pt states that he has played tennis but uses his heavy duty brace while doing so.  The soreness and swelling has decreased since he decreased the number of heel raises but does continue to some extent PERTINENT HISTORY: HTN,  PAIN:  Are you having pain? Yes: NPRS scale: 0/10; worst pain is a 2/10; mornings the pt has significant stiffness and some swelling.  Pain location: medial aspect of Lt ankle.  Pain description: dull aching  Aggravating factors: wt bearing  Relieving factors: getting off it  PRECAUTIONS: None  WEIGHT BEARING RESTRICTIONS: No  FALLS:  Has patient fallen in last 6 months? No  LIVING ENVIRONMENT: Lives with: lives with their family Lives in: House/apartment Stairs: Yes: External: 5 steps; on right going up OCCUPATION: retired  PLOF: Independent; played tennis 3 x a week has not played since  injury   PATIENT GOALS: to play tennis again   NEXT MD VISIT: if needed   OBJECTIVE:   DIAGNOSTIC FINDINGS: done at MD office.   PATIENT SURVEYS:  FOTO 69  COGNITION: Overall cognitive status: Within functional limits for tasks assessed     SENSATION: WFL  EDEMA:  Figure 8: RT 21 1/4 LT:  21 1/2   PALPATION: Tender to palpation along tibiotalar ligament.   LOWER EXTREMITY ROM: Ankle ROM is equal bilateral but pt feels pain along  inside of his ankle.   LOWER EXTREMITY MMT:  MMT Right eval Left eval  Hip flexion    Hip extension    Hip abduction    Hip adduction    Hip internal rotation    Hip external rotation    Knee flexion    Knee extension    Ankle dorsiflexion 5 5  Ankle plantarflexion 5 5  Ankle inversion 5 5  Ankle eversion 5 5   (Blank rows = not tested)   FUNCTIONAL TESTS:  30 seconds chair stand test:  13 average for age and sex.  Single leg stance:  Rt: 3"  , LT" 3"      TODAY'S TREATMENT:                                                                                                                              DATE:  01/04/23 Vector stance Tree pose x3 Warrior I x 3 Rocker board anterior/posterior/ RT /LT both x 2 minutes a piece. Baps board level 3 x 10 reps for RT/LT and anterior posterior; sitting rotation clockwise and counter clockwise x 5 each Wt blue ball for resisted rotation for forward and backward swing. In and out x 2 rep  Square diagonal shuffle Leg zig sag across line on floor x 30 seconds. Semi tandem stance with head turns.  .  01/01/23: Standing:  Squat to heel raise 15x 3-5" holds each  SLS Lt 7" Rt 5" max of 5  Palloff with GTB one foot on cone 20x  Tandem stance on foam   Braiding 5RT  Quick shuffle with ball toss to wall 3RT down hallway  Balance beam side step on foam with min A  Tandem gait on foam 3RT  Leg press with plantar flexion 2x 10 3Pl then 5Pl  12/28/22 Standing:  Squat to heel raise 15x 3-5" holds each  SLS 5" max of 5  Vector stance 5x 5" on foam with 1 HHA  Tandem stance 3x 30"  Tandem gait 5RT  Tandem gait on foam 5RT  Bodycraft 10 squat fast shuffle sidestep 4Pl both directions  Braiding 5RT  3 Cone tapping 5RT SLS  Jogging and fast turns 3RT down hallway   12/25/22: Standing:  Heel raises 20X incline slope  Toeraises 20X  SLS Lt 6", Rt 5"  Vector stance 5x 5" on foam with 1 HHA  Tandem stance on foam 2x 30", 1 x  5 reps  with head rotation  Body craft  lateral step out 5Pl 10x each  BAPS L2 5x each (DF/PF, Inv/Ev, CW/CCW)  Slant board 3x 30"  Squat then heel raise 10x 12/19/22 Goal review Standing: Slant board stretch 3X30"  Plantar fascia stretch 3X30" with 4" box  Heel raises 20X  Toeraises 20X  Tandem stance 2X20" on foam  4" lateral step heel tap 2X10 each with 1 UE assist  4" forward step up 2X10 each with 1 UE assist  4" forward step down heel tap 2X10 each with 1 UE assist  SLS 5 trials each with max 18" Rt, 8" Lt  BAPS level 2 10X each direction with bil UE assist  Vectors 10X3" each LE with 1 UE assist  12/17/22 Evaluation   Gastroc stretch 3 x30" Ankle alphabet x 1 Resisted ankle inversion with green theraband x 10    PATIENT EDUCATION:  Education details: HEP Person educated: Patient Education method: Explanation, Verbal cues, and Handouts Education comprehension: verbalized understanding  HOME EXERCISE PROGRAM: Access Code: [email or web address] URL: [email or web address] 01/04/23: warrior I and tree pose  12/28/22:  increase hold time with vectors and reduce HHA  12/25/22: squat to heel raise  Date 12/19/22:  vectors 10X3" each LE, SLS each LE  Date: 12/17/2022 Prepared by: Virgina Organ Exercises - Seated Ankle Alphabet  - 2 x daily - 7 x weekly - 1 sets - 3 reps - Gastroc Stretch on Wall  - 2 x daily - 7 x weekly - 1 sets - 3 reps - 30" hold - Seated Figure 4 Ankle Inversion with Resistance  - 2 x daily - 7 x weekly - 1 sets - 10 reps - 1 hold  ASSESSMENT:  CLINICAL IMPRESSION: Pt improved decreasing to 30 heel raises a day.  Added Tree and Warrior I poses to pt HEP.  Encouraged pt to try volleying tennis ball with lighter brace vs the heavy duty brace.    Session focus on ankle stability and balance activities with min A required on dynamic surfaces.  Encourage pt to practice non-aggressive volling.  OBJECTIVE IMPAIRMENTS: decreased activity tolerance, decreased  balance, increased edema, and pain.   ACTIVITY LIMITATIONS: locomotion level and playing tennis   PARTICIPATION LIMITATIONS: shopping, community activity, and tenis   REHAB POTENTIAL: Good  CLINICAL DECISION MAKING: Stable/uncomplicated  EVALUATION COMPLEXITY: Low   GOALS: Goals reviewed with patient? Yes  SHORT TERM GOALS: Target date: 12/31/22 PT to be I in HEP to decrease his Lt ankle pain down to no greater than a 2 Baseline:  12/28/22:  Reports compliance with HEP Goal status: IN PROGRESS  2.  PT to be able to walk for 20 minutes without having increased pain .  Baseline: 12/28/22: Feels he can walk a mile without pain. Goal status: IN PROGRESS    LONG TERM GOALS: Target date: 01/16/23  Pt to be I in advanced HEP in order to have his pain no greater than a 1/10 in his Lt ankle after a days activity.  Baseline:  Goal status: IN PROGRESS  2.  PT to be able to single leg stance for 10" on each leg to improve balance for safety of returning to play tennis.  Baseline:  12/28/22: 3-5" max Goal status: IN PROGRESS  3.  PT to have weaned himself out of his ankle brace.  Baseline: 12/28/22: Reports completely weaned Goal status: IN PROGRESS    PLAN:  PT FREQUENCY: 2x/week  PT DURATION: 4 weeks  PLANNED INTERVENTIONS:  Therapeutic exercises, Neuromuscular re-education, Balance training, Patient/Family education, Self Care, and Manual therapy  PLAN FOR NEXT SESSION: Progress LE stabilization as able. Progress SLS and balance training.  Assess return to non-aggressive tennis went.    Virgina Organ, PT CLT [phone]  01/04/2023 1425

## 2023-01-08 ENCOUNTER — Ambulatory Visit (HOSPITAL_COMMUNITY): Payer: 59 | Admitting: Physical Therapy

## 2023-01-08 DIAGNOSIS — M25572 Pain in left ankle and joints of left foot: Secondary | ICD-10-CM

## 2023-01-08 NOTE — Therapy (Signed)
OUTPATIENT PHYSICAL THERAPY TREATMENT   Patient Name: [name] Boyle MRN: [id number] DOB:1951/05/30, 72 y.o., male Today's Date: 01/08/2023 PHYSICAL THERAPY DISCHARGE SUMMARY  Visits from Start of Care: 7  Current functional level related to goals / functional outcomes: See below    Remaining deficits: none   Education / Equipment: HEP; continue balance exercises.    Patient agrees to discharge. Patient goals were partially met. Patient is being discharged due to being pleased with the current functional level.  END OF SESSION:  PT End of Session - 01/08/23 1429     Visit Number 7    Number of Visits 7    Date for PT Re-Evaluation 01/16/23    Authorization Type [employer]    PT Start Time 1430    PT Stop Time 1545    PT Time Calculation (min) 15 min    Activity Tolerance Patient tolerated treatment well    Behavior During Therapy WFL for tasks assessed/performed          Past Medical History:  Diagnosis Date   Diabetes mellitus without complication (HCC)    GERD (gastroesophageal reflux disease)    High cholesterol    Hypertension    Past Surgical History:  Procedure Laterality Date   COLONOSCOPY N/A 03/26/2019   Procedure: COLONOSCOPY;  Surgeon: Malissa Hippo, MD;  [facility];  Service: Endoscopy;  Laterality: N/A;  12:00   ESOPHAGOGASTRODUODENOSCOPY N/A 08/28/2018   Procedure: ESOPHAGOGASTRODUODENOSCOPY (EGD);  Surgeon: Malissa Hippo, MD;  [facility];  Service: Endoscopy;  Laterality: N/A;  2:00   ESOPHAGOGASTRODUODENOSCOPY (EGD) WITH PROPOFOL N/A 03/20/2022   Procedure: ESOPHAGOGASTRODUODENOSCOPY (EGD) WITH PROPOFOL;  Surgeon: Dolores Frame, MD;  [facility];  Service: Gastroenterology;  Laterality: N/A;  155 ASA 2   EXPLORATORY LAPAROTOMY  07/2017   ? partial malrotation- [facility]    KNEE SURGERY     LAPAROTOMY N/A 07/21/2018   Procedure: EXPLORATORY LAPAROTOMY, SMALL BOWEL RESECTION;  Surgeon:  [name], MD;  Location: [facility];  Service: General;  Laterality: N/A;   LYSIS OF ADHESION N/A 07/21/2018   Procedure: LYSIS OF ADHESION;  Surgeon: [name], MD;  Location: [facility];  Service: General;  Laterality: N/A;   Patient Active Problem List   Diagnosis Date Noted   Globus sensation 05/24/2022   History of colonic polyps 02/18/2019   Intestinal adhesions with partial obstruction (HCC)    Hypertension 07/17/2018   Type 2 diabetes mellitus (HCC) 07/17/2018   Hyperlipidemia 07/17/2018   SBO (small bowel obstruction) (HCC) 07/08/2018   Gastroesophageal reflux disease without esophagitis 06/20/2018   Small bowel obstruction (HCC) 06/09/2018    PCP: Pearson Grippe  REFERRING PROVIDER: Frederico Hamman, MD  REFERRING DIAG:  Diagnosis Description  lt ankle sprain w/posterior tibial tendon pain    THERAPY DIAG:  Pain in left ankle and joints of left foot  Rationale for Evaluation and Treatment: Rehabilitation  ONSET DATE: 08/03/22    SUBJECTIVE STATEMENT: Pt continues to complete his exercises at home.  PERTINENT HISTORY: HTN,  PAIN:  Are you having pain? Yes: NPRS scale: 0/10; worst pain is a 1/10; mornings the pt has significant stiffness and some swelling.  Pain location: medial aspect of Lt ankle.  Pain description: dull aching  Aggravating factors: wt bearing  Relieving factors: getting off it  PRECAUTIONS: None  WEIGHT BEARING RESTRICTIONS: No  FALLS:  Has patient fallen in last 6 months? No  LIVING ENVIRONMENT: Lives with: lives  with their family Lives in: House/apartment Stairs: Yes: External: 5 steps; on right going up OCCUPATION: retired  PLOF: Independent; played tennis 3 x a week has not played since injury   PATIENT GOALS: to play tennis again   NEXT MD VISIT: if needed   OBJECTIVE:   DIAGNOSTIC FINDINGS: done at MD office.   PATIENT SURVEYS:  FOTO 69  COGNITION: Overall cognitive status: Within functional limits for tasks  assessed     SENSATION: WFL  EDEMA:  Figure 8: RT 21 1/4 LT:  21 1/2   PALPATION: Tender to palpation along tibiotalar ligament.   LOWER EXTREMITY ROM: Ankle ROM is equal bilateral but pt feels pain along inside of his ankle.   LOWER EXTREMITY MMT:  MMT Right eval Left eval  Hip flexion    Hip extension    Hip abduction    Hip adduction    Hip internal rotation    Hip external rotation    Knee flexion    Knee extension    Ankle dorsiflexion 5 5  Ankle plantarflexion 5 5  Ankle inversion 5 5  Ankle eversion 5 5   (Blank rows = not tested)   FUNCTIONAL TESTS:  30 seconds chair stand test:  13 average for age and sex.  01/08/23: 19 which is above excellent for pt age and sex  Single leg stance:  Rt: 3"  , LT" 3"   01/08/23:  RT: 9", LT: 9"     TODAY'S TREATMENT:                                                                                                                              DATE:  01/08/23 Reassess; Single heel lift 10 RT/ 10 Lt Single leg stance x 3 B Braiding down hall Heel walking down hall  01/04/23 Vector stance Tree pose x3 Warrior I x 3 Rocker board anterior/posterior/ RT /LT both x 2 minutes a piece. Baps board level 3 x 10 reps for RT/LT and anterior posterior; sitting rotation clockwise and counter clockwise x 5 each Wt blue ball for resisted rotation for forward and backward swing. In and out x 2 rep  Square diagonal shuffle Leg zig sag across line on floor x 30 seconds. Semi tandem stance with head turns.  .  01/01/23: Standing:  Squat to heel raise 15x 3-5" holds each  SLS Lt 7" Rt 5" max of 5  Palloff with GTB one foot on cone 20x  Tandem stance on foam   Braiding 5RT  Quick shuffle with ball toss to wall 3RT down hallway  Balance beam side step on foam with min A  Tandem gait on foam 3RT  Leg press with plantar flexion 2x 10 3Pl then 5Pl  12/28/22 Standing:  Squat to heel raise 15x 3-5" holds each  SLS 5" max of  5  Vector stance 5x 5" on foam with 1 HHA  Tandem stance 3x 30"  Tandem  gait 5RT  Tandem gait on foam 5RT  Bodycraft 10 squat fast shuffle sidestep 4Pl both directions  Braiding 5RT  3 Cone tapping 5RT SLS  Jogging and fast turns 3RT down hallway   12/25/22: Standing:  Heel raises 20X incline slope  Toeraises 20X  SLS Lt 6", Rt 5"  Vector stance 5x 5" on foam with 1 HHA  Tandem stance on foam 2x 30", 1 x 5 reps with head rotation  Body craft  lateral step out 5Pl 10x each  BAPS L2 5x each (DF/PF, Inv/Ev, CW/CCW)  Slant board 3x 30"  Squat then heel raise 10x 12/19/22 Goal review Standing: Slant board stretch 3X30"  Plantar fascia stretch 3X30" with 4" box  Heel raises 20X  Toeraises 20X  Tandem stance 2X20" on foam  4" lateral step heel tap 2X10 each with 1 UE assist  4" forward step up 2X10 each with 1 UE assist  4" forward step down heel tap 2X10 each with 1 UE assist  SLS 5 trials each with max 18" Rt, 8" Lt  BAPS level 2 10X each direction with bil UE assist  Vectors 10X3" each LE with 1 UE assist  12/17/22 Evaluation   Gastroc stretch 3 x30" Ankle alphabet x 1 Resisted ankle inversion with green theraband x 10    PATIENT EDUCATION:  Education details: HEP Person educated: Patient Education method: Explanation, Verbal cues, and Handouts Education comprehension: verbalized understanding  HOME EXERCISE PROGRAM: Access Code: [email or web address] URL: [email or web address] 01/04/23: warrior I and tree pose  12/28/22:  increase hold time with vectors and reduce HHA  12/25/22: squat to heel raise  Date 12/19/22:  vectors 10X3" each LE, SLS each LE  Date: 12/17/2022 Prepared by: Virgina Organ Exercises - Seated Ankle Alphabet  - 2 x daily - 7 x weekly - 1 sets - 3 reps - Gastroc Stretch on Wall  - 2 x daily - 7 x weekly - 1 sets - 3 reps - 30" hold - Seated Figure 4 Ankle Inversion with Resistance  - 2 x daily - 7 x weekly - 1 sets - 10 reps - 1  hold  ASSESSMENT:  CLINICAL IMPRESSION: Pt improved decreasing to 30 heel raises a day.  Added Tree and Warrior I poses to pt HEP.  Encouraged pt to try volleying tennis ball with lighter brace vs the heavy duty brace.    Session focus on ankle stability and balance activities with min A required on dynamic surfaces.  Encourage pt to practice non-aggressive volling.  OBJECTIVE IMPAIRMENTS: decreased activity tolerance, decreased balance, increased edema, and pain.   ACTIVITY LIMITATIONS: locomotion level and playing tennis   PARTICIPATION LIMITATIONS: shopping, community activity, and tenis   REHAB POTENTIAL: Good  CLINICAL DECISION MAKING: Stable/uncomplicated  EVALUATION COMPLEXITY: Low   GOALS: Goals reviewed with patient? Yes  SHORT TERM GOALS: Target date: 12/31/22 PT to be I in HEP to decrease his Lt ankle pain down to no greater than a 2 Baseline:  12/28/22:  Reports compliance with HEP Goal status: MET  2.  PT to be able to walk for 20 minutes without having increased pain .  Baseline: 12/28/22: Feels he can walk a mile without pain. Goal status: MET    LONG TERM GOALS: Target date: 01/16/23  Pt to be I in advanced HEP in order to have his pain no greater than a 1/10 in his Lt ankle after a days activity.  Baseline:  Goal status: MET  2.  PT to be  able to single leg stance for 10" on each leg to improve balance for safety of returning to play tennis.  Baseline:  12/28/22: 3-5" max Goal status: IN PROGRESS ; able to balance for 9" on each.   3.  PT to have weaned himself out of his ankle brace.  Baseline: 12/28/22: Reports completely weaned Goal status: partially met only uses brace with tennis.     PLAN:  PT FREQUENCY: 2x/week  PT DURATION: 4 weeks  PLANNED INTERVENTIONS: Therapeutic exercises, Neuromuscular re-education, Balance training, Patient/Family education, Self Care, and Manual therapy  PLAN FOR NEXT SESSION: Pt is doing well able to be more  aggressive in tennis.  Pt will be discharged at this time.   Virgina Organ, PT CLT [phone]  01/08/2023 1450

## 2023-01-10 ENCOUNTER — Encounter (HOSPITAL_COMMUNITY): Payer: 59

## 2023-02-14 ENCOUNTER — Ambulatory Visit (INDEPENDENT_AMBULATORY_CARE_PROVIDER_SITE_OTHER): Payer: 59 | Admitting: Gastroenterology

## 2023-03-25 ENCOUNTER — Other Ambulatory Visit (HOSPITAL_COMMUNITY): Payer: Self-pay | Admitting: Podiatry

## 2023-03-25 DIAGNOSIS — S86112A Strain of other muscle(s) and tendon(s) of posterior muscle group at lower leg level, left leg, initial encounter: Secondary | ICD-10-CM

## 2023-04-04 ENCOUNTER — Ambulatory Visit (INDEPENDENT_AMBULATORY_CARE_PROVIDER_SITE_OTHER): Payer: 59 | Admitting: Gastroenterology

## 2023-04-04 ENCOUNTER — Encounter (INDEPENDENT_AMBULATORY_CARE_PROVIDER_SITE_OTHER): Payer: Self-pay | Admitting: Gastroenterology

## 2023-04-04 VITALS — BP 138/76 | Ht 65.0 in | Wt 184.3 lb

## 2023-04-04 DIAGNOSIS — R09A2 Foreign body sensation, throat: Secondary | ICD-10-CM

## 2023-04-04 DIAGNOSIS — K529 Noninfective gastroenteritis and colitis, unspecified: Secondary | ICD-10-CM | POA: Insufficient documentation

## 2023-04-04 DIAGNOSIS — R197 Diarrhea, unspecified: Secondary | ICD-10-CM

## 2023-04-04 NOTE — Patient Instructions (Signed)
Please continue with elavil 25mg  nightly I will obtain recent labs from your PCP for review I would like to check a few other labs in regards to your diarrhea You can try taking over the counter imodium to help slow diarrhea as needed  Follow up 6 months

## 2023-04-04 NOTE — Progress Notes (Addendum)
Referring Provider: Pearson Grippe, MD Primary Care Physician:  Pearson Grippe, MD Primary GI Physician: Dr. Levon Hedger   Chief Complaint  Patient presents with   globus sensation    Follow up on globus sensation. States doing better since starting amitriptyline. Has concerns about having watery stools since stopping omeprazole and famotidine. And concerns about decrease appetite.    HPI:   [name] Boyle is a 72 y.o. male with past medical history of DM, GERD, high cholesterol, HTN.    Patient presenting today for follow up of Globus sensation  Last seen November 2023, at that time, felt globus sensation about 75% improved with initiation of Amitryptiline 10mg  at bedtime. He stopped his PPI and H2B and feeling well.   Recommended to increase amitryptiline to 25mg  at bedtime  Present:  He notes that he feels that his globus sensation is about 90% improved with increased dosage of amitryptiline. He still has to clear his throat at times but much better.  He notes that about 1 week after coming off of his PPI and H2B he began having diarrhea, number 6-7 on bristol stool scale, having 3-4 BMs per day. He notes having some solid stools intermittently, especially if he eats more cheese, reporting he had mac and cheese and a cheeseburger last night and had a formed stool after. Denies fecal urgency or incontinence. No rectal bleeding or melena. He denies any abdominal pain. No nausea or vomiting. No other medication changes other than PPI or H2B. He notes slight decrease in appetite but no weight loss.   He had recent labs in June with his PCP to include thyroid function which he states has been normal.   -pH impedence testing done on PPI, normal suppression of acid exposure and no episodes of nonacidic reflux. There was frequent throat clearing but this was not associated to acid exposure. -Esophageal manometry was also normal.  Last EGD:03/20/22- Normal esophagus. - Gastroesophageal flap valve  classified as Hill Grade II (fold present, opens with respiration). - Normal stomach. - Normal examined duodenum. Last Colonoscopy: 2020 The entire examined colon is normal. - The distal rectum and anal verge are normal on retroflexion view. - No specimens collected.   Past Medical History:  Diagnosis Date   Diabetes mellitus without complication (HCC)    GERD (gastroesophageal reflux disease)    High cholesterol    Hypertension     Past Surgical History:  Procedure Laterality Date   COLONOSCOPY N/A 03/26/2019   Procedure: COLONOSCOPY;  Surgeon: Malissa Hippo, MD;  [facility];  Service: Endoscopy;  Laterality: N/A;  12:00   ESOPHAGOGASTRODUODENOSCOPY N/A 08/28/2018   Procedure: ESOPHAGOGASTRODUODENOSCOPY (EGD);  Surgeon: Malissa Hippo, MD;  [facility];  Service: Endoscopy;  Laterality: N/A;  2:00   ESOPHAGOGASTRODUODENOSCOPY (EGD) WITH PROPOFOL N/A 03/20/2022   Procedure: ESOPHAGOGASTRODUODENOSCOPY (EGD) WITH PROPOFOL;  Surgeon: Dolores Frame, MD;  [facility];  Service: Gastroenterology;  Laterality: N/A;  155 ASA 2   EXPLORATORY LAPAROTOMY  07/2017   ? partial malrotation- [facility]    KNEE SURGERY     LAPAROTOMY N/A 07/21/2018   Procedure: EXPLORATORY LAPAROTOMY, SMALL BOWEL RESECTION;  Surgeon: [name], MD;  Location: [facility];  Service: General;  Laterality: N/A;   LYSIS OF ADHESION N/A 07/21/2018   Procedure: LYSIS OF ADHESION;  Surgeon: [name], MD;  Location: [facility];  Service: General;  Laterality: N/A;    Current Outpatient Medications  Medication Sig Dispense Refill  amitriptyline (ELAVIL) 25 MG tablet TAKE 1 TABLET BY MOUTH AT  BEDTIME 90 tablet 1   amLODipine (NORVASC) 10 MG tablet Take 10 mg by mouth every morning.      Ascorbic Acid (VITAMIN C) 1000 MG tablet Take 1,000 mg by mouth every morning.      aspirin EC 81 MG tablet Take 81 mg by mouth daily.     atorvastatin (LIPITOR) 40 MG  tablet Take 40 mg by mouth every evening.      azelastine (ASTELIN) 0.1 % nasal spray Place 1 spray into both nostrils every morning. Use in each nostril as directed-May use in the evening as needed     B Complex-C (SUPER B COMPLEX PO) Take 1 tablet by mouth daily.     chlorthalidone (HYGROTON) 25 MG tablet Take 25 mg by mouth daily.     Cholecalciferol (VITAMIN D-3) 25 MCG (1000 UT) CAPS Take 1,000 Units by mouth daily.     clobetasol cream (TEMOVATE) 0.05 % Apply 1 application  topically daily as needed (irritation). 2-3 times per week.     Coenzyme Q10 300 MG CAPS Take 300 mg by mouth daily.     enalapril (VASOTEC) 20 MG tablet Take 20 mg by mouth 2 (two) times daily.     fexofenadine (ALLEGRA) 180 MG tablet Take 180 mg by mouth every morning.      fluticasone (CUTIVATE) 0.05 % cream Apply 1 Application topically 2 (two) times daily as needed (irritation).     fluticasone (FLONASE) 50 MCG/ACT nasal spray Place 1 spray into both nostrils daily.     folic acid (FOLVITE) 800 MCG tablet Take 6,400 mcg by mouth daily.     Glucosamine HCl 1500 MG TABS Take 3,000 mg by mouth daily.     hydrALAZINE (APRESOLINE) 10 MG tablet Take 10 mg by mouth 3 (three) times daily.     ketoconazole (NIZORAL) 2 % cream Apply 1 application  topically daily as needed for irritation. 2-3 times per week.     levothyroxine (SYNTHROID) 50 MCG tablet Take 50 mcg by mouth daily before breakfast.     meloxicam (MOBIC) 15 MG tablet Take 15 mg by mouth daily as needed for pain.     metFORMIN (GLUCOPHAGE) 500 MG tablet Take 500 mg by mouth every evening.      Multiple Vitamin (MULTIVITAMIN WITH MINERALS) TABS tablet Take 1 tablet by mouth daily.     naphazoline-pheniramine (NAPHCON-A) 0.025-0.3 % ophthalmic solution Place 1-2 drops into both eyes daily.     Omega-3 Fatty Acids (FISH OIL ULTRA) 1400 MG CAPS Take 1,400 mg by mouth daily.     Polyethyl Glycol-Propyl Glycol 0.4-0.3 % SOLN Place 1-2 drops into both eyes daily.      Turmeric (QC TUMERIC COMPLEX PO) Take 3,000 mg by mouth daily.     No current facility-administered medications for this visit.    Allergies as of 04/04/2023   (No Known Allergies)    Family History  Problem Relation Age of Onset   Diabetes Father    Diabetes Maternal Grandfather     Social History   Socioeconomic History   Marital status: Divorced    Spouse name: Not on file   Number of children: Not on file   Years of education: Not on file   Highest education level: Not on file  Occupational History   Not on file  Tobacco Use   Smoking status: Former    Current packs/day: 0.00    Types: Cigarettes  Start date: 07/23/1964    Quit date: 07/23/2000    Years since quitting: 22.7    Passive exposure: Past   Smokeless tobacco: Never  Vaping Use   Vaping status: Never Used  Substance and Sexual Activity   Alcohol use: Yes    Comment: moderate   Drug use: Never   Sexual activity: Not on file  Other Topics Concern   Not on file  Social History Narrative   Not on file   Social Determinants of Health   Financial Resource Strain: Not on file  Food Insecurity: Not on file  Transportation Needs: Not on file  Physical Activity: Not on file  Stress: Not on file  Social Connections: Not on file    Review of systems General: negative for malaise, night sweats, fever, chills, weight loss Neck: Negative for lumps, goiter, pain and significant neck swelling Resp: Negative for cough, wheezing, dyspnea at rest CV: Negative for chest pain, leg swelling, palpitations, orthopnea GI: denies melena, hematochezia, nausea, vomiting, constipation, dysphagia, odyonophagia, early satiety or unintentional weight loss. +globus sensation +diarrhea  MSK: Negative for joint pain or swelling, back pain, and muscle pain. Derm: Negative for itching or rash Psych: Denies depression, anxiety, memory loss, confusion. No homicidal or suicidal ideation.  Heme: Negative for prolonged bleeding,  bruising easily, and swollen nodes. Endocrine: Negative for cold or heat intolerance, polyuria, polydipsia and goiter. Neuro: negative for tremor, gait imbalance, syncope and seizures. The remainder of the review of systems is noncontributory.  Physical Exam: BP 138/76 (BP Location: Left Arm, Patient Position: Sitting, Cuff Size: Large)   Ht 5\' 5"  (1.651 m)   Wt 184 lb 4.8 oz (83.6 kg)   BMI 30.67 kg/m  General:   Alert and oriented. No distress noted. Pleasant and cooperative.  Head:  Normocephalic and atraumatic. Eyes:  Conjuctiva clear without scleral icterus. Mouth:  Oral mucosa pink and moist. Good dentition. No lesions. Heart: Normal rate and rhythm, s1 and s2 heart sounds present.  Lungs: Clear lung sounds in all lobes. Respirations equal and unlabored. Abdomen:  +BS, soft, non-tender and non-distended. No rebound or guarding. No HSM or masses noted. Derm: No palmar erythema or jaundice Msk:  Symmetrical without gross deformities. Normal posture. Extremities:  Without edema. Neurologic:  Alert and  oriented x4 Psych:  Alert and cooperative. Normal mood and affect.  Invalid input(s): "6 MONTHS"   ASSESSMENT: [name] Boyle is a 72 y.o. male presenting today for follow up of globus sensation and with diarrhea  Globus sensation: 90% improved with amitryptiline 25mg  at bedtime. He denies any associated side effects. Given age, will continue with amitryptiline at current dosage as he has improved with this and risk for potential adverse effects more likely if we increase his dose much higher.   Diarrhea: diarrhea about 1 week after stopping PPI therapy that has been ongoing since November. Reports recent basic labs with PCP have been normal. Can have a formed stool with certain foods like cheese. Query if this is rebound diarrhea after cessation of PPI therapy though given time frame, this seems less likely, will check celiac panel to rule out celiac disease and also check gastrin  levels to get a better idea if this could be potential rebound from stopping PPI therapy. Reassuringly he has had no weight loss, he denies abdominal pain, rectal bleeding or melena.   PLAN:  Obtain labs from PCP  2.  Amitryptilline 25mg  at bedtime  3. Celiac panel, gastrin level 4. Imodium PRN  All questions were answered, patient verbalized understanding and is in agreement with plan as outlined above.    Follow Up: 6 months   Nox Talent L. Jeanmarie Hubert, MSN, APRN, AGNP-C Adult-Gerontology Nurse Practitioner [facility]  I have reviewed the note and agree with the APP's assessment as described in this progress note  Katrinka Blazing, MD Gastroenterology and Hepatology [facility] Gastroenterology

## 2023-04-16 ENCOUNTER — Ambulatory Visit (HOSPITAL_COMMUNITY)
Admission: RE | Admit: 2023-04-16 | Discharge: 2023-04-16 | Disposition: A | Discharge status: Home / Self Care | Payer: 59 | Source: Ambulatory Visit | Attending: Podiatry | Admitting: Podiatry

## 2023-04-16 DIAGNOSIS — S86112A Strain of other muscle(s) and tendon(s) of posterior muscle group at lower leg level, left leg, initial encounter: Secondary | ICD-10-CM | POA: Insufficient documentation

## 2023-04-27 ENCOUNTER — Other Ambulatory Visit (INDEPENDENT_AMBULATORY_CARE_PROVIDER_SITE_OTHER): Payer: Self-pay | Admitting: Gastroenterology

## 2023-07-05 ENCOUNTER — Other Ambulatory Visit (HOSPITAL_COMMUNITY): Payer: Self-pay | Admitting: Orthopedic Surgery

## 2023-07-05 DIAGNOSIS — M25552 Pain in left hip: Secondary | ICD-10-CM

## 2023-07-05 DIAGNOSIS — M545 Low back pain, unspecified: Secondary | ICD-10-CM

## 2023-07-15 ENCOUNTER — Ambulatory Visit (HOSPITAL_COMMUNITY)
Admission: RE | Admit: 2023-07-15 | Discharge: 2023-07-15 | Disposition: A | Discharge status: Home / Self Care | Payer: 59 | Source: Ambulatory Visit | Attending: Orthopedic Surgery | Admitting: Orthopedic Surgery

## 2023-07-15 DIAGNOSIS — M25552 Pain in left hip: Secondary | ICD-10-CM

## 2023-07-15 DIAGNOSIS — M545 Low back pain, unspecified: Secondary | ICD-10-CM | POA: Insufficient documentation

## 2023-09-13 ENCOUNTER — Encounter (HOSPITAL_COMMUNITY): Payer: Self-pay

## 2023-09-13 ENCOUNTER — Other Ambulatory Visit: Payer: Self-pay

## 2023-09-13 ENCOUNTER — Ambulatory Visit (HOSPITAL_COMMUNITY): Payer: 59 | Attending: Physical Medicine and Rehabilitation

## 2023-09-13 DIAGNOSIS — M48061 Spinal stenosis, lumbar region without neurogenic claudication: Secondary | ICD-10-CM | POA: Insufficient documentation

## 2023-09-13 DIAGNOSIS — M6281 Muscle weakness (generalized): Secondary | ICD-10-CM | POA: Insufficient documentation

## 2023-09-13 DIAGNOSIS — M25572 Pain in left ankle and joints of left foot: Secondary | ICD-10-CM | POA: Diagnosis present

## 2023-09-13 DIAGNOSIS — M545 Low back pain, unspecified: Secondary | ICD-10-CM | POA: Insufficient documentation

## 2023-09-13 DIAGNOSIS — M79605 Pain in left leg: Secondary | ICD-10-CM | POA: Diagnosis present

## 2023-09-13 NOTE — Therapy (Signed)
 OUTPATIENT PHYSICAL THERAPY THORACOLUMBAR EVALUATION   Patient Name: [name] Boyle MRN: [id number] DOB:[birth year], 73 y.o., male Today's Date: 09/13/2023  END OF SESSION:  PT End of Session - 09/13/23 0859     Visit Number 1    Date for PT Re-Evaluation 10/25/23    Authorization Type [employer]    Authorization Time Period Seeking auth; 20 visit limit    Authorization - Visit Number 1    Authorization - Number of Visits 20    Progress Note Due on Visit 6    PT Start Time 0800    PT Stop Time 0840    PT Time Calculation (min) 40 min    Activity Tolerance Patient tolerated treatment well    Behavior During Therapy WFL for tasks assessed/performed             Past Medical History:  Diagnosis Date   Diabetes mellitus without complication (HCC)    GERD (gastroesophageal reflux disease)    High cholesterol    Hypertension    Past Surgical History:  Procedure Laterality Date   COLONOSCOPY N/A 03/26/2019   Procedure: COLONOSCOPY;  Surgeon: Golda Claudis PENNER, MD;  [facility];  Service: Endoscopy;  Laterality: N/A;  12:00   ESOPHAGOGASTRODUODENOSCOPY N/A 08/28/2018   Procedure: ESOPHAGOGASTRODUODENOSCOPY (EGD);  Surgeon: Golda Claudis PENNER, MD;  [facility];  Service: Endoscopy;  Laterality: N/A;  2:00   ESOPHAGOGASTRODUODENOSCOPY (EGD) WITH PROPOFOL  N/A 03/20/2022   Procedure: ESOPHAGOGASTRODUODENOSCOPY (EGD) WITH PROPOFOL ;  Surgeon: Eartha Angelia Sieving, MD;  [facility];  Service: Gastroenterology;  Laterality: N/A;  155 ASA 2   EXPLORATORY LAPAROTOMY  07/2017   ? partial malrotation- [facility]    KNEE SURGERY     LAPAROTOMY N/A 07/21/2018   Procedure: EXPLORATORY LAPAROTOMY, SMALL BOWEL RESECTION;  Surgeon: Kallie Manuelita BROCKS, MD;  Location: [facility];  Service: General;  Laterality: N/A;   LYSIS OF ADHESION N/A 07/21/2018   Procedure: LYSIS OF ADHESION;  Surgeon: Kallie Manuelita BROCKS, MD;  Location: [facility];  Service: General;  Laterality: N/A;    Patient Active Problem List   Diagnosis Date Noted   Diarrhea 04/04/2023   Globus sensation 05/24/2022   History of colonic polyps 02/18/2019   Intestinal adhesions with partial obstruction (HCC)    Hypertension 07/17/2018   Type 2 diabetes mellitus (HCC) 07/17/2018   Hyperlipidemia 07/17/2018   SBO (small bowel obstruction) (HCC) 07/08/2018   Gastroesophageal reflux disease without esophagitis 06/20/2018   Small bowel obstruction (HCC) 06/09/2018    PCP: Luke Lynwood MD  REFERRING PROVIDER: Margeret Kotyk, MD  REFERRING DIAG: LEFT LEG RAD & PAIN LT HIP OA, 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 Pt reports injuring initially while playing tennis 4 months ago. Pt reports he is doing something active and sport related daily. Pt reports that pain is most noticeable when walking up hills, running, and playing tennis and prolonged sitting/rest breaks. Intermittent in nature. Worst is 7-8/10 with about two day of consistent low level pain.    PERTINENT HISTORY:  Severe Ankle sprain   PAIN:  Are you having pain? Yes: NPRS scale: 7-8/10 Pain location: Low back-left side into proximal left hamstring Pain description: Constantly sharp Aggravating factors: running, tennis, walking uphill Relieving factors: Rest and prescribed anti-inflammatory  PRECAUTIONS: None  RED FLAGS: None   WEIGHT BEARING RESTRICTIONS: No  FALLS:  Has patient fallen in last 6 months? No  PATIENT GOALS: 'Play  tennis for 3 hours without stopping for pain'   OBJECTIVE:  Note: Objective measures were completed at Evaluation unless otherwise noted.  DIAGNOSTIC FINDINGS:  IMPRESSION: 1. Moderate to moderately severe central canal stenosis and bilateral subarticular recess narrowing at L4-5 due to advanced facet arthropathy, a shallow disc bulge and ligamentum flavum thickening. Mild bilateral foraminal narrowing is also present at this level. 2. Mild bilateral foraminal narrowing at L5-S1 is worse on the left.     Electronically Signed   By: Debby Prader M.D.   On: 08/07/2023 09:51  PATIENT SURVEYS:  FOTO 61.13  COGNITION: Overall cognitive status: Within functional limits for tasks assessed     SENSATION: WFL   POSTURE: decreased lumbar lordosis  PALPATION: Non tender to palpate along Lumbar paraspinals and L gluteus medius  LUMBAR ROM:   AROM eval  Flexion 100%  Extension 100%  Right lateral flexion 75%  Left lateral flexion 75%  Right rotation 60%  Left rotation 60%   (Blank rows = not tested)  LOWER EXTREMITY ROM:     Active  Right eval Left eval  Hip flexion    Hip extension    Hip abduction    Hip adduction    Hip internal rotation 15 15  Hip external rotation WNL WNL  Knee flexion    Knee extension    Ankle dorsiflexion    Ankle plantarflexion    Ankle inversion    Ankle eversion     (Blank rows = not tested)  LOWER EXTREMITY MMT:    MMT Right eval Left eval  Hip flexion 4- 4-  Hip extension 4- 4-  Hip abduction 3+ 3  Hip adduction    Hip internal rotation    Hip external rotation    Knee flexion    Knee extension 4+ 4-  Ankle dorsiflexion    Ankle plantarflexion    Ankle inversion    Ankle eversion     (Blank rows = not tested)  LUMBAR SPECIAL TESTS:  Straight leg raise test: Negative and Slump test: Negative  FUNCTIONAL TESTS:  30 seconds chair stand test: 16x   GAIT: Distance walked: 9ft and 131ft jogging Assistive  device utilized: None Level of assistance: Complete Independence Comments: trendelenberg more noticeable during jogging with increased internal rotation of femur during bilateral swing phase with decreased cadence and lateral LE landing.   TREATMENT DATE:   09/13/2023 PT Evaluation     and HEP below  PATIENT EDUCATION:  Education details: PT Evaluation, findings, prognosis, frequency, attendance policy, and HEP if given.  Person educated: Patient Education method: Explanation, Demonstration, Tactile cues, and Verbal cues Education comprehension: verbalized understanding and returned demonstration  HOME EXERCISE PROGRAM: Access Code: [email or web address] URL: [email or web address] Date: 09/13/2023 Prepared by: Omega Bottcher  Exercises - Supine Posterior Pelvic Tilt  - 1 x daily - 7 x weekly - 3 sets - 10 reps - Supine Bridge  - 1 x daily - 7 x weekly - 3 sets - 10 reps - Supine Piriformis Stretch with Foot on Ground  - 1 x daily - 7 x weekly - 3 sets - 10 reps - 30-60 hold - Clamshell  - 1 x daily - 7 x weekly - 3 sets - 10 reps - Supine Lower Trunk Rotation  - 1 x daily - 7 x weekly - 3 sets - 20-30 reps - Sidelying Reverse Clamshell  - 1 x daily - 7 x weekly - 3 sets - 10 reps  ASSESSMENT:  CLINICAL IMPRESSION: Patient is a 73 y.o. 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 Pt is a very active older adult male who enjoys playing tennis, jogging, and various other recreational activities. Worst pain is exacerbated by prolonged tennis playing with L lumbar and gluteal pain into proximal hamstring. Pt showing lumbar mobility limitations, hip mobility limitations, and bilateral gluteal muscle weakness which contribute and cause of activity and participation limitations below. Pt  will benefit from skilled Physical Therapy services to address deficits/limitations in order to improve functional and QOL.   OBJECTIVE IMPAIRMENTS: Abnormal gait, decreased mobility, difficulty walking, decreased ROM, decreased strength, postural dysfunction, and pain.   ACTIVITY LIMITATIONS: carrying, bending, squatting, and stairs  PARTICIPATION LIMITATIONS: community activity and occupation  PERSONAL FACTORS: Age are also affecting patient's functional outcome.   REHAB POTENTIAL: Excellent  CLINICAL DECISION MAKING: Stable/uncomplicated  EVALUATION COMPLEXITY: Low   GOALS: Goals reviewed with patient? No  SHORT TERM GOALS: Target date: 10/04/2023  Pt will be independent with HEP in order to demonstrate participation in Physical Therapy POC.  Baseline: Goal status: INITIAL  2.  Pt will report 5/10 pain with mobility in order to demonstrate improved pain with functional activities.  Baseline:  Goal status: INITIAL  LONG TERM GOALS: Target date: 10/25/2023  Pt will improve Lumbar rotation ROM by 10% in order to demonstrate improved functional ROM to return to desired activities.  Baseline: See objective.  Goal status: INITIAL  2.  Pt will report tolerating 2 hours of strenous sport activities with reported pain <4/10 pain in order to demonstrate improved functional dynamic sporting capacity in community setting.  Baseline: See objective.  Goal status: INITIAL  3.  Pt will improve FOTO score by 10 points in order to demonstrate improved pain with functional goals and outcomes. Baseline: See objective.  Goal status: INITIAL  4.  Pt will report 2/10 pain with mobility in order to demonstrate reduced pain with overall functional status.  Baseline: See objective.  Goal status: INITIAL  5.  Pt will increased bilateral hip MMT by > 1/2 grade in order to improve musculature endurance during sport-like activities.  Baseline: See objective.  Goal status: INITIAL  PLAN:  PT  FREQUENCY: 1x/week  PT DURATION: 6 weeks  PLANNED INTERVENTIONS: 97164- PT Re-evaluation, 97110-Therapeutic exercises, 97530- Therapeutic activity, [id number]- Neuromuscular re-education, 97535- Self Care, 02859- Manual therapy, [id number]- Gait training, 97014-  Electrical stimulation (unattended), [id number]- Electrical stimulation (manual), and 02987- Traction (mechanical).  PLAN FOR NEXT SESSION: Hip IR ROM, gluteus medius strengthening, progress HEP each session.   Omega JONETTA Donna ALMETA, DPT [facility] [phone] office  [facility] Medicare Auth Request Information  Date of referral: 09/05/2023 Referring provider: Ibazebo, [place], MD Referring diagnosis (ICD 10)? M54.50, M79.605 Treatment diagnosis (ICD 10)? (if different than referring diagnosis) M62.81  Functional Tool Score: 33  What was this (referring dx) caused by? Ongoing Issue  Lysle of Condition: Recurrent (multiple episodes of < 3 months)   Laterality: Lt  Current Functional Measure Score: FOTO 61  Objective measurements identify impairments when they are compared to normal values, the uninvolved extremity, and prior level of function.  [x]  Yes  []  No  Objective assessment of functional ability: Minimal functional limitations   Briefly describe symptoms: See above for details  How did symptoms start: 4 months ago  Average pain intensity:  Last 24 hours: 2/10  Past week: 8/10  How often does the pt experience symptoms? Intermittently  How much have the symptoms interfered with usual daily activities? A little bit  How has condition changed since care began at this facility? No change  In general, how is the patients overall health? Excellent   BACK PAIN (STarT Back Screening Tool) Has pain spread down the leg(s) at some time in the last 2 weeks? Into left buttocks Has the pt only walked short distances because of back pain? No Has patient stopped enjoying things they usually enjoy?  Limits time in enjoyable activity     Omega JONETTA Donna, PT 09/13/2023, 9:01 AM

## 2023-09-20 ENCOUNTER — Ambulatory Visit (HOSPITAL_COMMUNITY): Payer: 59

## 2023-09-20 DIAGNOSIS — M545 Low back pain, unspecified: Secondary | ICD-10-CM | POA: Diagnosis not present

## 2023-09-20 DIAGNOSIS — M6281 Muscle weakness (generalized): Secondary | ICD-10-CM

## 2023-09-20 DIAGNOSIS — M48061 Spinal stenosis, lumbar region without neurogenic claudication: Secondary | ICD-10-CM

## 2023-09-20 DIAGNOSIS — M25572 Pain in left ankle and joints of left foot: Secondary | ICD-10-CM

## 2023-09-20 NOTE — Therapy (Signed)
 OUTPATIENT PHYSICAL THERAPY THORACOLUMBAR EVALUATION   Patient Name: [name] Boyle MRN: [id number] DOB:04-08-1951, 73 y.o., male Today's Date: 09/20/2023  END OF SESSION:  PT End of Session - 09/20/23 0805     Visit Number 2    Date for PT Re-Evaluation 10/25/23    Authorization Type [employer]    Authorization Time Period Seeking auth; 20 visit limit    Authorization - Number of Visits 20    Progress Note Due on Visit 6    PT Start Time 0805    PT Stop Time 0845    PT Time Calculation (min) 40 min    Activity Tolerance Patient tolerated treatment well    Behavior During Therapy WFL for tasks assessed/performed             Past Medical History:  Diagnosis Date   Diabetes mellitus without complication (HCC)    GERD (gastroesophageal reflux disease)    High cholesterol    Hypertension    Past Surgical History:  Procedure Laterality Date   COLONOSCOPY N/A 03/26/2019   Procedure: COLONOSCOPY;  Surgeon: Golda Claudis PENNER, MD;  [facility];  Service: Endoscopy;  Laterality: N/A;  12:00   ESOPHAGOGASTRODUODENOSCOPY N/A 08/28/2018   Procedure: ESOPHAGOGASTRODUODENOSCOPY (EGD);  Surgeon: Golda Claudis PENNER, MD;  [facility];  Service: Endoscopy;  Laterality: N/A;  2:00   ESOPHAGOGASTRODUODENOSCOPY (EGD) WITH PROPOFOL  N/A 03/20/2022   Procedure: ESOPHAGOGASTRODUODENOSCOPY (EGD) WITH PROPOFOL ;  Surgeon: Eartha Angelia Sieving, MD;  [facility];  Service: Gastroenterology;  Laterality: N/A;  155 ASA 2   EXPLORATORY LAPAROTOMY  07/2017   ? partial malrotation- [facility]    KNEE SURGERY     LAPAROTOMY N/A 07/21/2018   Procedure: EXPLORATORY LAPAROTOMY, SMALL BOWEL RESECTION;  Surgeon: Kallie Manuelita BROCKS, MD;  Location: [facility];  Service: General;  Laterality: N/A;   LYSIS OF ADHESION N/A 07/21/2018   Procedure: LYSIS OF ADHESION;  Surgeon: Kallie Manuelita BROCKS, MD;  Location: [facility];  Service: General;  Laterality: N/A;   Patient Active Problem List    Diagnosis Date Noted   Diarrhea 04/04/2023   Globus sensation 05/24/2022   History of colonic polyps 02/18/2019   Intestinal adhesions with partial obstruction (HCC)    Hypertension 07/17/2018   Type 2 diabetes mellitus (HCC) 07/17/2018   Hyperlipidemia 07/17/2018   SBO (small bowel obstruction) (HCC) 07/08/2018   Gastroesophageal reflux disease without esophagitis 06/20/2018   Small bowel obstruction (HCC) 06/09/2018    PCP: Luke Lynwood MD  REFERRING PROVIDER: Margeret Kotyk, MD  REFERRING DIAG: LEFT LEG RAD & PAIN LT HIP OA, 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  ONSET DATE: 4 months  SUBJECTIVE:  SUBJECTIVE STATEMENT: Pt reports Low back pain that radiates into left hip and distally into proximal hamstring. Pt reports injuring initially while playing tennis 4 months ago. Pt reports he is doing something active and sport related daily. Pt reports that pain is most noticeable when walking up hills, running, and playing tennis and prolonged sitting/rest breaks. Intermittent in nature. Worst is 7-8/10 with about two day of consistent low level pain.    PERTINENT HISTORY:  Severe Ankle sprain   PAIN:  Are you having pain? Yes: NPRS scale: 7-8/10 Pain location: Low back-left side into proximal left hamstring Pain description: Constantly sharp Aggravating factors: running, tennis, walking uphill Relieving factors: Rest and prescribed anti-inflammatory  PRECAUTIONS: None  RED FLAGS: None   WEIGHT BEARING RESTRICTIONS: No  FALLS:  Has patient fallen in last 6 months? No  PATIENT GOALS: 'Play tennis for 3 hours without stopping for pain'   OBJECTIVE:  Note: Objective measures were completed at Evaluation  unless otherwise noted.  DIAGNOSTIC FINDINGS:  IMPRESSION: 1. Moderate to moderately severe central canal stenosis and bilateral subarticular recess narrowing at L4-5 due to advanced facet arthropathy, a shallow disc bulge and ligamentum flavum thickening. Mild bilateral foraminal narrowing is also present at this level. 2. Mild bilateral foraminal narrowing at L5-S1 is worse on the left.     Electronically Signed   By: Debby Prader M.D.   On: 08/07/2023 09:51  PATIENT SURVEYS:  FOTO 61.13  COGNITION: Overall cognitive status: Within functional limits for tasks assessed     SENSATION: WFL   POSTURE: decreased lumbar lordosis  PALPATION: Non tender to palpate along Lumbar paraspinals and L gluteus medius  LUMBAR ROM:   AROM eval  Flexion 100%  Extension 100%  Right lateral flexion 75%  Left lateral flexion 75%  Right rotation 60%  Left rotation 60%   (Blank rows = not tested)  LOWER EXTREMITY ROM:     Active  Right eval Left eval  Hip flexion    Hip extension    Hip abduction    Hip adduction    Hip internal rotation 15 15  Hip external rotation WNL WNL  Knee flexion    Knee extension    Ankle dorsiflexion    Ankle plantarflexion    Ankle inversion    Ankle eversion     (Blank rows = not tested)  LOWER EXTREMITY MMT:    MMT Right eval Left eval  Hip flexion 4- 4-  Hip extension 4- 4-  Hip abduction 3+ 3  Hip adduction    Hip internal rotation    Hip external rotation    Knee flexion    Knee extension 4+ 4-  Ankle dorsiflexion    Ankle plantarflexion    Ankle inversion    Ankle eversion     (Blank rows = not tested)  LUMBAR SPECIAL TESTS:  Straight leg raise test: Negative and Slump test: Negative  FUNCTIONAL TESTS:  30 seconds chair stand test: 16x   GAIT: Distance walked: 62ft and 156ft jogging Assistive device utilized: None Level of assistance: Complete Independence Comments: trendelenberg more noticeable during jogging  with increased internal rotation of femur during bilateral swing phase with decreased cadence and lateral LE landing.   TREATMENT DATE:  09/20/23 Supine  Left hip manual therapy: mobilization with movement into flexion, IR to improve ROM  Right sidelying reverse clams with G theraband 2x10  Right single leg clamshells with G theraband 2x10  Supine hip flexion with G theraband 2x10  PT Evaluation and HEP below                                                                                                                            PATIENT EDUCATION:  Education details: PT Evaluation, findings, prognosis, frequency, attendance policy, and HEP if given.  Person educated: Patient Education method: Explanation, Demonstration, Tactile cues, and Verbal cues Education comprehension: verbalized understanding and returned demonstration  HOME EXERCISE PROGRAM: Access Code: [email or web address] URL: [email or web address] Date: 09/20/2023 Prepared by: Deward Ming  Exercises - Supine Posterior Pelvic Tilt  - 1 x daily - 7 x weekly - 3 sets - 10 reps - Supine Hip Flexion with Resistance Loop  - 1 x daily - 7 x weekly - 3 sets - 10 reps - Supine Bridge with Resistance Band  - 1 x daily - 7 x weekly - 3 sets - 10 reps - Clamshell with Resistance  - 1 x daily - 7 x weekly - 3 sets - 10 reps - Sidelying Reverse Clamshell with Resistance  - 1 x daily - 7 x weekly - 3 sets - 10 reps  ASSESSMENT:  CLINICAL IMPRESSION: Today: PT continued to progress patient with lumbar/hip stability. PT further assessed hip active range of motion; MILD limitations in left hip IR/FLEXION. PT incorporated manual therapy to improve left hip active range of motion before continuing to strengthen lower extremity. PT updated HEP, see above.  Patient will continue to benefit from PT to return to prior level of function.    I.E: Patient is a 73 y.o. male who was seen today for physical therapy evaluation and  treatment for LEFT LEG RAD & PAIN LT HIP OA, SPINAL STENOSIS @ L4-5. Pt is a very active older adult male who enjoys playing tennis, jogging, and various other recreational activities. Worst pain is exacerbated by prolonged tennis playing with L lumbar and gluteal pain into proximal hamstring. Pt showing lumbar mobility limitations, hip mobility limitations, and bilateral gluteal muscle weakness which contribute and cause of activity and participation limitations below. Pt will benefit from skilled Physical Therapy services to address deficits/limitations in order to improve functional and QOL.   OBJECTIVE IMPAIRMENTS: Abnormal gait, decreased mobility, difficulty walking, decreased ROM, decreased strength, postural dysfunction, and pain.   ACTIVITY LIMITATIONS: carrying, bending, squatting, and stairs  PARTICIPATION LIMITATIONS: community activity and occupation  PERSONAL FACTORS: Age are also affecting patient's functional outcome.   REHAB POTENTIAL: Excellent  CLINICAL DECISION MAKING: Stable/uncomplicated  EVALUATION COMPLEXITY: Low   GOALS: Goals reviewed with patient? No  SHORT TERM GOALS: Target date: 10/04/2023  Pt will be independent with HEP in order to demonstrate participation in Physical Therapy POC.  Baseline: Goal status: INITIAL  2.  Pt will report 5/10 pain with mobility in order to demonstrate improved pain with functional activities.  Baseline:  Goal status: INITIAL  LONG TERM GOALS: Target date: 10/25/2023  Pt will improve Lumbar rotation ROM by 10% in order to demonstrate improved  functional ROM to return to desired activities.  Baseline: See objective.  Goal status: INITIAL  2.  Pt will report tolerating 2 hours of strenous sport activities with reported pain <4/10 pain in order to demonstrate improved functional dynamic sporting capacity in community setting.  Baseline: See objective.  Goal status: INITIAL  3.  Pt will improve FOTO score by 10 points in  order to demonstrate improved pain with functional goals and outcomes. Baseline: See objective.  Goal status: INITIAL  4.  Pt will report 2/10 pain with mobility in order to demonstrate reduced pain with overall functional status.  Baseline: See objective.  Goal status: INITIAL  5.  Pt will increased bilateral hip MMT by > 1/2 grade in order to improve musculature endurance during sport-like activities.  Baseline: See objective.  Goal status: INITIAL  PLAN:  PT FREQUENCY: 1x/week  PT DURATION: 6 weeks  PLANNED INTERVENTIONS: 97164- PT Re-evaluation, 97110-Therapeutic exercises, 97530- Therapeutic activity, [id number]- Neuromuscular re-education, 97535- Self Care, 02859- Manual therapy, [id number]- Gait training, 97014- Electrical stimulation (unattended), [id number]- Electrical stimulation (manual), and [id number]- Traction (mechanical).  PLAN FOR NEXT SESSION: Hip IR ROM, gluteus medius strengthening, progress HEP each session.   Omega JONETTA Donna ALMETA, DPT [facility]- [facility] [phone] office  [facility] Medicare Auth Request Information  Date of referral: 09/05/2023 Referring provider: Ibazebo, Wales, MD Referring diagnosis (ICD 10)? M54.50, M79.605 Treatment diagnosis (ICD 10)? (if different than referring diagnosis) M62.81  Functional Tool Score: 62  What was this (referring dx) caused by? Ongoing Issue  Lysle of Condition: Recurrent (multiple episodes of < 3 months)   Laterality: Lt  Current Functional Measure Score: FOTO 61  Objective measurements identify impairments when they are compared to normal values, the uninvolved extremity, and prior level of function.  [x]  Yes  []  No  Objective assessment of functional ability: Minimal functional limitations   Briefly describe symptoms: See above for details  How did symptoms start: 4 months ago  Average pain intensity:  Last 24 hours: 2/10  Past week: 8/10  How often does the pt experience symptoms?  Intermittently  How much have the symptoms interfered with usual daily activities? A little bit  How has condition changed since care began at this facility? No change  In general, how is the patients overall health? Excellent   BACK PAIN (STarT Back Screening Tool) Has pain spread down the leg(s) at some time in the last 2 weeks? Into left buttocks Has the pt only walked short distances because of back pain? No Has patient stopped enjoying things they usually enjoy? Limits time in enjoyable activity     Deward Ming, PT 09/20/2023, 8:05 AM

## 2023-09-27 ENCOUNTER — Ambulatory Visit (HOSPITAL_COMMUNITY): Payer: 59

## 2023-09-27 DIAGNOSIS — M545 Low back pain, unspecified: Secondary | ICD-10-CM | POA: Diagnosis not present

## 2023-09-27 DIAGNOSIS — M48061 Spinal stenosis, lumbar region without neurogenic claudication: Secondary | ICD-10-CM

## 2023-09-27 DIAGNOSIS — M6281 Muscle weakness (generalized): Secondary | ICD-10-CM

## 2023-09-27 DIAGNOSIS — M25572 Pain in left ankle and joints of left foot: Secondary | ICD-10-CM

## 2023-09-27 NOTE — Therapy (Signed)
OUTPATIENT PHYSICAL THERAPY THORACOLUMBAR EVALUATION   Patient Name: [name] Boyle MRN: [id number] DOB:December 15, 1950, 73 y.o., male Today's Date: 09/27/2023  END OF SESSION:  PT End of Session - 09/27/23 0758     Visit Number 3    Date for PT Re-Evaluation 10/25/23    Authorization Type [employer]    Authorization Time Period Seeking auth; 20 visit limit    Authorization - Number of Visits 20    Progress Note Due on Visit 6    PT Start Time 0805    PT Stop Time 0845    PT Time Calculation (min) 40 min    Activity Tolerance Patient tolerated treatment well    Behavior During Therapy WFL for tasks assessed/performed             Past Medical History:  Diagnosis Date   Diabetes mellitus without complication (HCC)    GERD (gastroesophageal reflux disease)    High cholesterol    Hypertension    Past Surgical History:  Procedure Laterality Date   COLONOSCOPY N/A 03/26/2019   Procedure: COLONOSCOPY;  Surgeon: Malissa Hippo, MD;  [facility];  Service: Endoscopy;  Laterality: N/A;  12:00   ESOPHAGOGASTRODUODENOSCOPY N/A 08/28/2018   Procedure: ESOPHAGOGASTRODUODENOSCOPY (EGD);  Surgeon: Malissa Hippo, MD;  [facility];  Service: Endoscopy;  Laterality: N/A;  2:00   ESOPHAGOGASTRODUODENOSCOPY (EGD) WITH PROPOFOL N/A 03/20/2022   Procedure: ESOPHAGOGASTRODUODENOSCOPY (EGD) WITH PROPOFOL;  Surgeon: Dolores Frame, MD;  [facility];  Service: Gastroenterology;  Laterality: N/A;  155 ASA 2   EXPLORATORY LAPAROTOMY  07/2017   ? partial malrotation- [facility]    KNEE SURGERY     LAPAROTOMY N/A 07/21/2018   Procedure: EXPLORATORY LAPAROTOMY, SMALL BOWEL RESECTION;  Surgeon: [name], MD;  Location: [facility];  Service: General;  Laterality: N/A;   LYSIS OF ADHESION N/A 07/21/2018   Procedure: LYSIS OF ADHESION;  Surgeon: [name], MD;  Location: [facility];  Service: General;  Laterality: N/A;   Patient Active Problem List    Diagnosis Date Noted   Diarrhea 04/04/2023   Globus sensation 05/24/2022   History of colonic polyps 02/18/2019   Intestinal adhesions with partial obstruction (HCC)    Hypertension 07/17/2018   Type 2 diabetes mellitus (HCC) 07/17/2018   Hyperlipidemia 07/17/2018   SBO (small bowel obstruction) (HCC) 07/08/2018   Gastroesophageal reflux disease without esophagitis 06/20/2018   Small bowel obstruction (HCC) 06/09/2018    PCP: Pearson Grippe MD  REFERRING PROVIDER: Romero Belling, MD  REFERRING DIAG: LEFT LEG RAD & PAIN LT HIP OA, 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 Pt reports injuring initially while playing tennis 4 months ago. Pt reports he is doing something active and sport related daily. Pt reports that pain is most noticeable when walking up hills, running, and playing tennis and prolonged sitting/rest breaks. Intermittent in nature. Worst is 7-8/10 with about two day of consistent low level pain.    PERTINENT HISTORY:  Severe Ankle sprain  Bilateral quad tendon rupture repair 2008   PAIN:  Are you having pain? Yes: NPRS scale: 7-8/10 Pain location: Low back-left side into proximal left hamstring Pain description: Constantly sharp Aggravating factors: running, tennis, walking uphill Relieving factors: Rest and prescribed  anti-inflammatory  PRECAUTIONS: None  RED FLAGS: None   WEIGHT BEARING RESTRICTIONS: No  FALLS:  Has patient fallen in last 6 months? No  PATIENT GOALS: 'Play tennis for 3 hours without stopping for pain'   OBJECTIVE:  Note: Objective measures were completed at Evaluation unless otherwise noted.  DIAGNOSTIC FINDINGS:  IMPRESSION: 1. Moderate to moderately severe central canal stenosis and bilateral subarticular recess narrowing at L4-5 due to advanced facet arthropathy, a shallow disc bulge and ligamentum flavum thickening. Mild bilateral foraminal narrowing is also present at this level. 2. Mild bilateral foraminal narrowing at L5-S1 is worse on the left.     Electronically Signed   By: Drusilla Kanner M.D.   On: 08/07/2023 09:51  PATIENT SURVEYS:  FOTO 61.13  COGNITION: Overall cognitive status: Within functional limits for tasks assessed     SENSATION: WFL   POSTURE: decreased lumbar lordosis  PALPATION: Non tender to palpate along Lumbar paraspinals and L gluteus medius  LUMBAR ROM:   AROM eval  Flexion 100%  Extension 100%  Right lateral flexion 75%  Left lateral flexion 75%  Right rotation 60%  Left rotation 60%   (Blank rows = not tested)  LOWER EXTREMITY ROM:     Active  Right eval Left eval  Hip flexion    Hip extension    Hip abduction    Hip adduction    Hip internal rotation 15 15  Hip external rotation WNL WNL  Knee flexion    Knee extension    Ankle dorsiflexion    Ankle plantarflexion    Ankle inversion    Ankle eversion     (Blank rows = not tested)  LOWER EXTREMITY MMT:    MMT Right eval Left eval  Hip flexion 4- 4-  Hip extension 4- 4-  Hip abduction 3+ 3  Hip adduction    Hip internal rotation    Hip external rotation    Knee flexion    Knee extension 4+ 4-  Ankle dorsiflexion    Ankle plantarflexion    Ankle inversion    Ankle eversion     (Blank rows = not tested)  LUMBAR SPECIAL TESTS:  Straight  leg raise test: Negative and Slump test: Negative  FUNCTIONAL TESTS:  30 seconds chair stand test: 16x   GAIT: Distance walked: 33ft and 178ft jogging Assistive device utilized: None Level of assistance: Complete Independence Comments: trendelenberg more noticeable during jogging with increased internal rotation of femur during bilateral swing phase with decreased cadence and lateral LE landing.   TREATMENT DATE:  09/27/23 Supine  Single leg hip bridges 2x10 each Standing Double leg hip hinges  with Tidal Tank @ 10# 2x10 Staggered stance hip hinges with Tidal Tank @ 10# x10 Hip abduction/ extension with 3# 2x10  Elliptical(retro) cooldown 2-3 min    09/20/23 Supine  Left hip manual therapy: mobilization with movement into flexion, IR to improve ROM  Right sidelying reverse clams with G theraband 2x10  Right single leg clamshells with G theraband 2x10  Supine hip flexion with G theraband 2x10   PT Evaluation and HEP below                                                                                                                            PATIENT EDUCATION:  Education details: PT Evaluation, findings, prognosis, frequency, attendance policy, and HEP if given.  Person educated: Patient Education method: Explanation, Demonstration, Tactile cues, and Verbal cues Education comprehension: verbalized understanding and returned demonstration  HOME EXERCISE PROGRAM: Access Code: [email or web address] URL: [email or web address] Date: 09/27/2023 Prepared by: Seymour Bars  Exercises - Supine Posterior Pelvic Tilt  - 1 x daily - 7 x weekly - 3 sets - 10 reps - Supine Hip Flexion with Resistance Loop  - 1 x daily - 7 x weekly - 3 sets - 10 reps - Supine Bridge with Resistance Band  - 1 x daily - 7 x weekly - 3 sets - 10 reps - Clamshell with Resistance  - 1 x daily - 7 x weekly - 3 sets - 10 reps - Sidelying Reverse Clamshell with Resistance  - 1 x daily - 7 x weekly - 3  sets - 10 reps - Figure 4 Bridge  - 1 x daily - 3 sets - 10 reps  ASSESSMENT:  CLINICAL IMPRESSION: Today: PT continued to progress patient with lumbar/hip stability. PT introduced standing position with double/leg and staggered stance hip hinges to work on bending/lifting/stair negotiation. Patient requires minimal verbal cueing for proper therapeutic exercise progression with no increase in pain levels. HEP updated/reviewed.  Patient will continue to benefit from PT to return to prior level of function.    I.E: Patient is a 73 y.o. 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 Pt is a very active older adult male who enjoys playing tennis, jogging, and various other recreational activities. Worst pain is exacerbated by prolonged tennis playing with L lumbar and gluteal pain into proximal hamstring. Pt showing lumbar mobility limitations, hip mobility limitations, and bilateral gluteal muscle weakness which contribute and cause of activity and participation limitations below. Pt will benefit from skilled Physical Therapy services to address deficits/limitations in order to improve functional and QOL.   OBJECTIVE IMPAIRMENTS: Abnormal gait, decreased mobility, difficulty walking, decreased ROM, decreased strength, postural dysfunction, and pain.   ACTIVITY LIMITATIONS: carrying, bending, squatting, and stairs  PARTICIPATION LIMITATIONS: community activity and occupation  PERSONAL FACTORS: Age are also affecting patient's functional outcome.   REHAB POTENTIAL: Excellent  CLINICAL DECISION MAKING: Stable/uncomplicated  EVALUATION COMPLEXITY: Low   GOALS: Goals reviewed with patient? No  SHORT TERM GOALS: Target date: 10/04/2023  Pt will be independent with HEP in order to demonstrate participation in Physical Therapy POC.  Baseline: Goal status: INITIAL  2.  Pt will report 5/10 pain with mobility in  order to demonstrate improved pain with functional activities.  Baseline:  Goal status: INITIAL  LONG TERM GOALS: Target date: 10/25/2023  Pt will improve Lumbar rotation ROM by 10% in order to demonstrate improved functional ROM to return to desired activities.  Baseline: See objective.  Goal status: INITIAL  2.  Pt will report tolerating 2 hours of strenous sport activities with reported pain <4/10 pain in order to demonstrate improved functional dynamic sporting capacity in community setting.  Baseline: See objective.  Goal status: INITIAL  3.  Pt will improve FOTO score by 10 points in order to demonstrate improved pain with functional goals and outcomes. Baseline: See objective.  Goal status: INITIAL  4.  Pt will report 2/10 pain with mobility in order to demonstrate reduced pain with overall functional status.  Baseline: See objective.  Goal status: INITIAL  5.  Pt will increased bilateral hip MMT by > 1/2 grade in order to improve musculature endurance during sport-like activities.  Baseline: See objective.  Goal status: INITIAL  PLAN:  PT FREQUENCY: 1x/week  PT DURATION: 6 weeks  PLANNED INTERVENTIONS: 97164- PT Re-evaluation, 97110-Therapeutic exercises, 97530- Therapeutic activity, [id number]- Neuromuscular re-education, 97535- Self Care, 82956- Manual therapy, [id number]- Gait training, 97014- Electrical stimulation (unattended), [id number]- Electrical stimulation (manual), and [id number]- Traction (mechanical).  PLAN FOR NEXT SESSION: Hip IR ROM, gluteus medius strengthening Seymour Bars PT, DPT  [facility]-  [phone] office    Seymour Bars, PT 09/27/2023, 8:07 AM

## 2023-10-04 ENCOUNTER — Ambulatory Visit (HOSPITAL_COMMUNITY): Payer: 59

## 2023-10-04 ENCOUNTER — Encounter (HOSPITAL_COMMUNITY): Payer: Self-pay

## 2023-10-04 DIAGNOSIS — M545 Low back pain, unspecified: Secondary | ICD-10-CM | POA: Diagnosis not present

## 2023-10-04 DIAGNOSIS — M48061 Spinal stenosis, lumbar region without neurogenic claudication: Secondary | ICD-10-CM

## 2023-10-04 NOTE — Therapy (Addendum)
OUTPATIENT PHYSICAL THERAPY THORACOLUMBAR EVALUATION   Patient Name: [name] Boyle MRN: [id number] DOB:Mar 07, 1951, 73 y.o., male Today's Date: 10/04/2023  END OF SESSION:  PT End of Session - 10/04/23 0841     Visit Number 4    Date for PT Re-Evaluation 10/25/23    Authorization Type UHC    Authorization Time Period Federal UHC- no auth required    Authorization - Number of Visits 20    Progress Note Due on Visit 6    PT Start Time 0803    PT Stop Time 0841    PT Time Calculation (min) 38 min    Activity Tolerance Patient tolerated treatment well    Behavior During Therapy WFL for tasks assessed/performed              Past Medical History:  Diagnosis Date   Diabetes mellitus without complication (HCC)    GERD (gastroesophageal reflux disease)    High cholesterol    Hypertension    Past Surgical History:  Procedure Laterality Date   COLONOSCOPY N/A 03/26/2019   Procedure: COLONOSCOPY;  Surgeon: Malissa Hippo, MD;  [facility];  Service: Endoscopy;  Laterality: N/A;  12:00   ESOPHAGOGASTRODUODENOSCOPY N/A 08/28/2018   Procedure: ESOPHAGOGASTRODUODENOSCOPY (EGD);  Surgeon: Malissa Hippo, MD;  [facility];  Service: Endoscopy;  Laterality: N/A;  2:00   ESOPHAGOGASTRODUODENOSCOPY (EGD) WITH PROPOFOL N/A 03/20/2022   Procedure: ESOPHAGOGASTRODUODENOSCOPY (EGD) WITH PROPOFOL;  Surgeon: Dolores Frame, MD;  [facility];  Service: Gastroenterology;  Laterality: N/A;  155 ASA 2   EXPLORATORY LAPAROTOMY  07/2017   ? partial malrotation- [facility]    KNEE SURGERY     LAPAROTOMY N/A 07/21/2018   Procedure: EXPLORATORY LAPAROTOMY, SMALL BOWEL RESECTION;  Surgeon: [name], MD;  Location: [facility];  Service: General;  Laterality: N/A;   LYSIS OF ADHESION N/A 07/21/2018   Procedure: LYSIS OF ADHESION;  Surgeon: [name], MD;  Location: [facility];  Service: General;  Laterality: N/A;   Patient Active Problem List    Diagnosis Date Noted   Diarrhea 04/04/2023   Globus sensation 05/24/2022   History of colonic polyps 02/18/2019   Intestinal adhesions with partial obstruction (HCC)    Hypertension 07/17/2018   Type 2 diabetes mellitus (HCC) 07/17/2018   Hyperlipidemia 07/17/2018   SBO (small bowel obstruction) (HCC) 07/08/2018   Gastroesophageal reflux disease without esophagitis 06/20/2018   Small bowel obstruction (HCC) 06/09/2018    PCP: Pearson Grippe MD  REFERRING PROVIDER: Romero Belling, MD  REFERRING DIAG: LEFT LEG RAD & PAIN LT HIP OA, SPINAL STENOSIS @ L4-5   Rationale for Evaluation and Treatment: Rehabilitation  THERAPY DIAG:  Lumbar pain with radiation down left leg  Spinal stenosis at L4-L5 level  ONSET DATE: 4 months  SUBJECTIVE:  SUBJECTIVE STATEMENT: Pt reporting no pain today. Pt is reporting small improvement with extreme pain during sporting events, was initially 9/10 now down to 7/10.  Pt reports HEP has been main reason of pain improvements.  Eval: Pt reports Low back pain that radiates into left hip and distally into proximal hamstring. Pt reports injuring initially while playing tennis 4 months ago. Pt reports he is doing something active and sport related daily. Pt reports that pain is most noticeable when walking up hills, running, and playing tennis and prolonged sitting/rest breaks. Intermittent in nature. Worst is 7-8/10 with about two day of consistent low level pain.    PERTINENT HISTORY:  Severe Ankle sprain  Bilateral quad tendon rupture repair 2008   PAIN:  Are you having pain? Yes: NPRS scale: 7-8/10 Pain location: Low back-left side into proximal left hamstring Pain description: Constantly sharp Aggravating factors: running, tennis, walking uphill Relieving factors:  Rest and prescribed anti-inflammatory  PRECAUTIONS: None  RED FLAGS: None   WEIGHT BEARING RESTRICTIONS: No  FALLS:  Has patient fallen in last 6 months? No  PATIENT GOALS: 'Play tennis for 3 hours without stopping for pain'   OBJECTIVE:  Note: Objective measures were completed at Evaluation unless otherwise noted.  DIAGNOSTIC FINDINGS:  IMPRESSION: 1. Moderate to moderately severe central canal stenosis and bilateral subarticular recess narrowing at L4-5 due to advanced facet arthropathy, a shallow disc bulge and ligamentum flavum thickening. Mild bilateral foraminal narrowing is also present at this level. 2. Mild bilateral foraminal narrowing at L5-S1 is worse on the left.     Electronically Signed   By: Drusilla Kanner M.D.   On: 08/07/2023 09:51  PATIENT SURVEYS:  FOTO 61.13 FOTO: 70.35 COGNITION: Overall cognitive status: Within functional limits for tasks assessed     SENSATION: WFL   POSTURE: decreased lumbar lordosis  PALPATION: Non tender to palpate along Lumbar paraspinals and L gluteus medius  LUMBAR ROM:   AROM eval 10/04/2023 Re-evaluation  Flexion 100%   Extension 100%   Right lateral flexion 75% 90%  Left lateral flexion 75% 90%  Right rotation 60% 75%  Left rotation 60% 75%   (Blank rows = not tested)  LOWER EXTREMITY ROM:     Active  Right eval Left eval Right 10/04/2023 Left 10/04/2023  Hip flexion      Hip extension      Hip abduction      Hip adduction      Hip internal rotation 15 15 20 20   Hip external rotation WNL WNL    Knee flexion      Knee extension      Ankle dorsiflexion      Ankle plantarflexion      Ankle inversion      Ankle eversion       (Blank rows = not tested)  LOWER EXTREMITY MMT:    MMT Right eval Left eval Right 10/04/2023 Left 10/04/2023  Hip flexion 4- 4- 4 4  Hip extension 4- 4- 4- 4-  Hip abduction 3+ 3 4- 4-  Hip adduction      Hip internal rotation      Hip external rotation       Knee flexion      Knee extension 4+ 4- 5 5  Ankle dorsiflexion      Ankle plantarflexion      Ankle inversion      Ankle eversion       (Blank rows = not tested)  LUMBAR SPECIAL TESTS:  Straight leg raise test: Negative and Slump test: Negative  FUNCTIONAL TESTS:  30 seconds chair stand test: 16x   GAIT: Distance walked: 84ft and 170ft jogging Assistive device utilized: None Level of assistance: Complete Independence Comments: trendelenberg more noticeable during jogging with increased internal rotation of femur during bilateral swing phase with decreased cadence and lateral LE landing.   TREATMENT DATE: 10/04/2023  -FOTO -MMT -ROM -Hip IR standing excursions @ // bars 3 x 30'' -Half kneeling hip mobility IR in multiplanar movements 3 x30'' bilaterally -bodycraft walkouts multiplanar x 3 with #4 plate  1/61/09 Supine  Single leg hip bridges 2x10 each Standing Double leg hip hinges with Tidal Tank @ 10# 2x10 Staggered stance hip hinges with Tidal Tank @ 10# x10 Hip abduction/ extension with 3# 2x10  Elliptical(retro) cooldown 2-3 min    09/20/23 Supine  Left hip manual therapy: mobilization with movement into flexion, IR to improve ROM  Right sidelying reverse clams with G theraband 2x10  Right single leg clamshells with G theraband 2x10  Supine hip flexion with G theraband 2x10   PT Evaluation and HEP below                                                                                                                            PATIENT EDUCATION:  Education details: PT Evaluation, findings, prognosis, frequency, attendance policy, and HEP if given.  Person educated: Patient Education method: Explanation, Demonstration, Tactile cues, and Verbal cues Education comprehension: verbalized understanding and returned demonstration  HOME EXERCISE PROGRAM: Access Code: [email or web address] URL: [email or web address] Date: 09/27/2023 Prepared by: Seymour Bars  Exercises - Supine Posterior Pelvic Tilt  - 1 x daily - 7 x weekly - 3 sets - 10 reps - Supine Hip Flexion with Resistance Loop  - 1 x daily - 7 x weekly - 3 sets - 10 reps - Supine Bridge with Resistance Band  - 1 x daily - 7 x weekly - 3 sets - 10 reps - Clamshell with Resistance  - 1 x daily - 7 x weekly - 3 sets - 10 reps - Sidelying Reverse Clamshell with Resistance  - 1 x daily - 7 x weekly - 3 sets - 10 reps - Figure 4 Bridge  - 1 x daily - 3 sets - 10 reps Access Code: [email or web address] URL: [email or web address] Date: 10/04/2023 Prepared by: Starling Manns  Exercises - Standing Hip Internal Rotation Stretch  - 1 x daily - 7 x weekly - 3 sets - 10 reps - Half Kneeling Dorsiflexion Stretch at Wall  - 1 x daily - 7 x weekly - 3 sets - 10 reps - Prone Hip Internal and External Rotation AROM  - 1 x daily - 7 x weekly - 3 sets - 10 reps  ASSESSMENT:  CLINICAL IMPRESSION: Pt tolerating session well. Re-evaluation completed today due to pt's reported progress and potential DC. Pt reporting improvements from interventions in daily activities but has not had chance to  assess during sporting activities. Pt showing improvements in MMT and ROM but continues to be limited in IR of bilateral hips, this is primary limiting factor in pain due to pt's position during swinging and playing tennis. Plan is to DC after next two visits.  Patient will continue to benefit from PT to return to prior level of function.      OBJECTIVE IMPAIRMENTS: Abnormal gait, decreased mobility, difficulty walking, decreased ROM, decreased strength, postural dysfunction, and pain.   ACTIVITY LIMITATIONS: carrying, bending, squatting, and stairs  PARTICIPATION LIMITATIONS: community activity and occupation  PERSONAL FACTORS: Age are also affecting patient's functional outcome.   REHAB POTENTIAL: Excellent  CLINICAL DECISION MAKING: Stable/uncomplicated  EVALUATION COMPLEXITY:  Low   GOALS: Goals reviewed with patient? No  SHORT TERM GOALS: Target date: 10/04/2023  Pt will be independent with HEP in order to demonstrate participation in Physical Therapy POC.  Baseline: Goal status: INITIAL  2.  Pt will report 5/10 pain with mobility in order to demonstrate improved pain with functional activities.  Baseline:  Goal status: INITIAL  LONG TERM GOALS: Target date: 10/25/2023  Pt will improve Lumbar rotation ROM by 10% in order to demonstrate improved functional ROM to return to desired activities.  Baseline: See objective.  Goal status: INITIAL  2.  Pt will report tolerating 2 hours of strenous sport activities with reported pain <4/10 pain in order to demonstrate improved functional dynamic sporting capacity in community setting.  Baseline: See objective.  Goal status: INITIAL  3.  Pt will improve FOTO score by 10 points in order to demonstrate improved pain with functional goals and outcomes. Baseline: See objective.  Goal status: INITIAL  4.  Pt will report 2/10 pain with mobility in order to demonstrate reduced pain with overall functional status.  Baseline: See objective.  Goal status: INITIAL  5.  Pt will increased bilateral hip MMT by > 1/2 grade in order to improve musculature endurance during sport-like activities.  Baseline: See objective.  Goal status: INITIAL  PLAN:  PT FREQUENCY: 1x/week  PT DURATION: 6 weeks  PLANNED INTERVENTIONS: 97164- PT Re-evaluation, 97110-Therapeutic exercises, 97530- Therapeutic activity, [id number]- Neuromuscular re-education, 97535- Self Care, 40981- Manual therapy, [id number]- Gait training, 97014- Electrical stimulation (unattended), [id number]- Electrical stimulation (manual), and [id number]- Traction (mechanical).  PLAN FOR NEXT SESSION: Hip IR ROM, gluteus medius strengthening  Elie Goody, DPT [facility] [place] [address] [phone] office   Nelida Meuse, PT 10/04/2023, 9:02 AM

## 2023-10-07 ENCOUNTER — Ambulatory Visit (INDEPENDENT_AMBULATORY_CARE_PROVIDER_SITE_OTHER): Payer: 59 | Admitting: Gastroenterology

## 2023-10-07 ENCOUNTER — Encounter (INDEPENDENT_AMBULATORY_CARE_PROVIDER_SITE_OTHER): Payer: Self-pay | Admitting: Gastroenterology

## 2023-10-07 VITALS — BP 133/75 | HR 68 | Temp 98.4°F | Ht 65.0 in | Wt 189.5 lb

## 2023-10-07 DIAGNOSIS — R09A2 Foreign body sensation, throat: Secondary | ICD-10-CM

## 2023-10-07 DIAGNOSIS — R197 Diarrhea, unspecified: Secondary | ICD-10-CM | POA: Diagnosis not present

## 2023-10-07 MED ORDER — NA SULFATE-K SULFATE-MG SULF 17.5-3.13-1.6 GM/177ML PO SOLN: ORAL | 0 refills | Status: DC

## 2023-10-07 NOTE — Progress Notes (Signed)
Referring Provider: Pearson Grippe, MD Primary Care Physician:  Pearson Grippe, MD Primary GI Physician: Dr. Levon Hedger   Chief Complaint  Patient presents with   Diarrhea    Follow up on diarrhea. Still having to take 2 -3 imodium tablets per week to stop diarrhea.    HPI:   [name] Boyle is a 73 y.o. male with past medical history of  DM, GERD, high cholesterol, HTN.    Patient presenting today for follow up of globus sensation and diarrhea  Last seen July 2024, at that time feeling globus sensation is about 90% improved with increase in dosage of amitriptyline.  He did note about 1 week after stopping PPI and H2 blocker began having diarrhea.  Having 3-4 BMs per day.  Recent thyroid function was normal  Recommended continue amitriptyline 25 mg at bedtime, check celiac panel and gastrin level, Imodium as needed  Gastrin level in July 2024 was normal CRP in July 2024 was 8, celiac panel negative, celiac panel negative  Present: Patient denies abdominal pain, nausea, rectal bleeding. Still having diarrhea, taking imodium which will keep stools more solid for a few days then he will repeat the dose, taking imodium about 2-3 times per week and having watery stools if he does not take this. He reports if he does not take imodium he will still only have about 2 BMs per day.  No weight loss or change in appetite  Globus sensation is still about 90% improved on elavil. He notes still needing to clear his throat at times but no dysphagia. Denies heartburn or acid regurgitation.   -pH impedence testing done on PPI, normal suppression of acid exposure and no episodes of nonacidic reflux. There was frequent throat clearing but this was not associated to acid exposure. -Esophageal manometry was also normal.  Last EGD:03/20/22- Normal esophagus. - Gastroesophageal flap valve classified as Hill Grade II (fold present, opens with respiration). - Normal stomach. - Normal examined duodenum. Last  Colonoscopy: 03/2019 The entire examined colon is normal. - The distal rectum and anal verge are normal on retroflexion view. - No specimens collected.   Past Medical History:  Diagnosis Date   Diabetes mellitus without complication (HCC)    GERD (gastroesophageal reflux disease)    High cholesterol    Hypertension     Past Surgical History:  Procedure Laterality Date   COLONOSCOPY N/A 03/26/2019   Procedure: COLONOSCOPY;  Surgeon: Malissa Hippo, MD;  [facility];  Service: Endoscopy;  Laterality: N/A;  12:00   ESOPHAGOGASTRODUODENOSCOPY N/A 08/28/2018   Procedure: ESOPHAGOGASTRODUODENOSCOPY (EGD);  Surgeon: Malissa Hippo, MD;  [facility];  Service: Endoscopy;  Laterality: N/A;  2:00   ESOPHAGOGASTRODUODENOSCOPY (EGD) WITH PROPOFOL N/A 03/20/2022   Procedure: ESOPHAGOGASTRODUODENOSCOPY (EGD) WITH PROPOFOL;  Surgeon: Dolores Frame, MD;  [facility];  Service: Gastroenterology;  Laterality: N/A;  155 ASA 2   EXPLORATORY LAPAROTOMY  07/2017   ? partial malrotation- [facility]    KNEE SURGERY     LAPAROTOMY N/A 07/21/2018   Procedure: EXPLORATORY LAPAROTOMY, SMALL BOWEL RESECTION;  Surgeon: [name], MD;  Location: [facility];  Service: General;  Laterality: N/A;   LYSIS OF ADHESION N/A 07/21/2018   Procedure: LYSIS OF ADHESION;  Surgeon: [name], MD;  Location: [facility];  Service: General;  Laterality: N/A;    Current Outpatient Medications  Medication Sig Dispense Refill   atorvastatin (LIPITOR) 40 MG tablet Take 40 mg by mouth every  evening.      amitriptyline (ELAVIL) 25 MG tablet TAKE 1 TABLET BY MOUTH AT  BEDTIME 90 tablet 3   amLODipine (NORVASC) 10 MG tablet Take 10 mg by mouth every morning.      Ascorbic Acid (VITAMIN C) 1000 MG tablet Take 1,000 mg by mouth every morning.      aspirin EC 81 MG tablet Take 81 mg by mouth daily.     azelastine (ASTELIN) 0.1 % nasal spray Place 1 spray into both nostrils  every morning. Use in each nostril as directed-May use in the evening as needed     B Complex-C (SUPER B COMPLEX PO) Take 1 tablet by mouth daily.     chlorthalidone (HYGROTON) 25 MG tablet Take 25 mg by mouth daily.     Cholecalciferol (VITAMIN D-3) 25 MCG (1000 UT) CAPS Take 1,000 Units by mouth daily.     clobetasol cream (TEMOVATE) 0.05 % Apply 1 application  topically daily as needed (irritation). 2-3 times per week.     Coenzyme Q10 300 MG CAPS Take 300 mg by mouth daily.     enalapril (VASOTEC) 20 MG tablet Take 20 mg by mouth 2 (two) times daily.     fexofenadine (ALLEGRA) 180 MG tablet Take 180 mg by mouth every morning.      fluticasone (CUTIVATE) 0.05 % cream Apply 1 Application topically 2 (two) times daily as needed (irritation).     fluticasone (FLONASE) 50 MCG/ACT nasal spray Place 1 spray into both nostrils daily.     folic acid (FOLVITE) 800 MCG tablet Take 6,400 mcg by mouth daily.     Glucosamine HCl 1500 MG TABS Take 3,000 mg by mouth daily.     hydrALAZINE (APRESOLINE) 10 MG tablet Take 10 mg by mouth 3 (three) times daily.     ketoconazole (NIZORAL) 2 % cream Apply 1 application  topically daily as needed for irritation. 2-3 times per week.     levothyroxine (SYNTHROID) 50 MCG tablet Take 50 mcg by mouth daily before breakfast.     meloxicam (MOBIC) 15 MG tablet Take 15 mg by mouth daily as needed for pain.     metFORMIN (GLUCOPHAGE) 500 MG tablet Take 500 mg by mouth every evening.      Multiple Vitamin (MULTIVITAMIN WITH MINERALS) TABS tablet Take 1 tablet by mouth daily.     naphazoline-pheniramine (NAPHCON-A) 0.025-0.3 % ophthalmic solution Place 1-2 drops into both eyes daily.     Omega-3 Fatty Acids (FISH OIL ULTRA) 1400 MG CAPS Take 1,400 mg by mouth daily.     Polyethyl Glycol-Propyl Glycol 0.4-0.3 % SOLN Place 1-2 drops into both eyes daily.     Turmeric (QC TUMERIC COMPLEX PO) Take 3,000 mg by mouth daily.     No current facility-administered medications for  this visit.    Allergies as of 10/07/2023   (No Known Allergies)    Family History  Problem Relation Age of Onset   Diabetes Father    Diabetes Maternal Grandfather     Social History   Socioeconomic History   Marital status: Divorced    Spouse name: Not on file   Number of children: Not on file   Years of education: Not on file   Highest education level: Not on file  Occupational History   Not on file  Tobacco Use   Smoking status: Former    Current packs/day: 0.00    Types: Cigarettes    Start date: 07/23/1964    Quit date:  07/23/2000    Years since quitting: 23.2    Passive exposure: Past   Smokeless tobacco: Never  Vaping Use   Vaping status: Never Used  Substance and Sexual Activity   Alcohol use: Yes    Comment: moderate   Drug use: Never   Sexual activity: Not on file  Other Topics Concern   Not on file  Social History Narrative   Not on file   Social Drivers of Health   Financial Resource Strain: Not on file  Food Insecurity: Not on file  Transportation Needs: Not on file  Physical Activity: Not on file  Stress: Not on file  Social Connections: Not on file    Review of systems General: negative for malaise, night sweats, fever, chills, weight loss Neck: Negative for lumps, goiter, pain and significant neck swelling Resp: Negative for cough, wheezing, dyspnea at rest CV: Negative for chest pain, leg swelling, palpitations, orthopnea GI: denies melena, hematochezia, nausea, vomiting,  constipation, dysphagia, odyonophagia, early satiety or unintentional weight loss.  + Diarrhea + globus sensation The remainder of the review of systems is noncontributory.  Physical Exam: BP 133/75   Pulse 68   Temp 98.4 F (36.9 C)   Ht 5\' 5"  (1.651 m)   Wt 189 lb 8 oz (86 kg)   BMI 31.53 kg/m  General:   Alert and oriented. No distress noted. Pleasant and cooperative.  Head:  Normocephalic and atraumatic. Eyes:  Conjuctiva clear without scleral  icterus. Mouth:  Oral mucosa pink and moist. Good dentition. No lesions. Heart: Normal rate and rhythm, s1 and s2 heart sounds present.  Lungs: Clear lung sounds in all lobes. Respirations equal and unlabored. Abdomen:  +BS, soft, non-tender and non-distended. No rebound or guarding. No HSM or masses noted. Neurologic:  Alert and  oriented x4 Psych:  Alert and cooperative. Normal mood and affect.  Invalid input(s): "6 MONTHS"   ASSESSMENT: [name] Boyle is a 73 y.o. male presenting today for follow-up of globus sensation and diarrhea  Globus sensation: Remains about 90% improved with amitriptyline 25 mg at bedtime.  As of now he has no associated side effects with the medication.  Will continue with current regimen.  He did inquire if there is anything else that can be done to resolve the symptoms completely, we discussed referral to ENT though not sure they would be able to provide much to him in this case as I feel that globus sensation is more functional given the above testing.  At this time he declines referral to ENT.  Diarrhea: Onset July after stopping PPI therapy.  Initially greater this was rebound diarrhea after cessation of PPI though gastrin level after last visit was normal, diarrhea has persisted and he is not having to take Imodium a few times a week to keep stools more solid.  He has no abdominal pain, rectal bleeding, melena, weight loss.  At this time etiology of his diarrhea is unclear, cannot rule out presence of microscopic colitis especially in presence of TCA use which can sometimes precipitate this, would recommend proceeding with colonoscopy due to change in bowel habits. Indications, risks and benefits of procedure discussed in detail with patient. Patient verbalized understanding and is in agreement to proceed with colonoscopy   PLAN:  -continue with elavil 25 mg nightly -schedule colonoscopy ASA 3 -continue with imodium PRN   All questions were answered, patient  verbalized understanding and is in agreement with plan as outlined above.   Follow Up: 4 months  Ceceilia Cephus L. Jeanmarie Hubert, MSN, APRN, AGNP-C Adult-Gerontology Nurse Practitioner [facility] for [facility]  I have reviewed the note and agree with the APP's assessment as described in this progress note  Katrinka Blazing, MD Gastroenterology and Hepatology [facility]

## 2023-10-07 NOTE — H&P (View-Only) (Signed)
 Referring Provider: Pearson Grippe, MD Primary Care Physician:  Pearson Grippe, MD Primary GI Physician: Dr. Levon Hedger   Chief Complaint  Patient presents with   Diarrhea    Follow up on diarrhea. Still having to take 2 -3 imodium tablets per week to stop diarrhea.    HPI:   [name] Boyle is a 73 y.o. male with past medical history of  DM, GERD, high cholesterol, HTN.    Patient presenting today for follow up of globus sensation and diarrhea  Last seen July 2024, at that time feeling globus sensation is about 90% improved with increase in dosage of amitriptyline.  He did note about 1 week after stopping PPI and H2 blocker began having diarrhea.  Having 3-4 BMs per day.  Recent thyroid function was normal  Recommended continue amitriptyline 25 mg at bedtime, check celiac panel and gastrin level, Imodium as needed  Gastrin level in July 2024 was normal CRP in July 2024 was 8, celiac panel negative, celiac panel negative  Present: Patient denies abdominal pain, nausea, rectal bleeding. Still having diarrhea, taking imodium which will keep stools more solid for a few days then he will repeat the dose, taking imodium about 2-3 times per week and having watery stools if he does not take this. He reports if he does not take imodium he will still only have about 2 BMs per day.  No weight loss or change in appetite  Globus sensation is still about 90% improved on elavil. He notes still needing to clear his throat at times but no dysphagia. Denies heartburn or acid regurgitation.   -pH impedence testing done on PPI, normal suppression of acid exposure and no episodes of nonacidic reflux. There was frequent throat clearing but this was not associated to acid exposure. -Esophageal manometry was also normal.  Last EGD:03/20/22- Normal esophagus. - Gastroesophageal flap valve classified as Hill Grade II (fold present, opens with respiration). - Normal stomach. - Normal examined duodenum. Last  Colonoscopy: 03/2019 The entire examined colon is normal. - The distal rectum and anal verge are normal on retroflexion view. - No specimens collected.   Past Medical History:  Diagnosis Date   Diabetes mellitus without complication (HCC)    GERD (gastroesophageal reflux disease)    High cholesterol    Hypertension     Past Surgical History:  Procedure Laterality Date   COLONOSCOPY N/A 03/26/2019   Procedure: COLONOSCOPY;  Surgeon: Malissa Hippo, MD;  [facility];  Service: Endoscopy;  Laterality: N/A;  12:00   ESOPHAGOGASTRODUODENOSCOPY N/A 08/28/2018   Procedure: ESOPHAGOGASTRODUODENOSCOPY (EGD);  Surgeon: Malissa Hippo, MD;  [facility];  Service: Endoscopy;  Laterality: N/A;  2:00   ESOPHAGOGASTRODUODENOSCOPY (EGD) WITH PROPOFOL N/A 03/20/2022   Procedure: ESOPHAGOGASTRODUODENOSCOPY (EGD) WITH PROPOFOL;  Surgeon: Dolores Frame, MD;  [facility];  Service: Gastroenterology;  Laterality: N/A;  155 ASA 2   EXPLORATORY LAPAROTOMY  07/2017   ? partial malrotation- [facility]    KNEE SURGERY     LAPAROTOMY N/A 07/21/2018   Procedure: EXPLORATORY LAPAROTOMY, SMALL BOWEL RESECTION;  Surgeon: [name], MD;  Location: [facility];  Service: General;  Laterality: N/A;   LYSIS OF ADHESION N/A 07/21/2018   Procedure: LYSIS OF ADHESION;  Surgeon: [name], MD;  Location: [facility];  Service: General;  Laterality: N/A;    Current Outpatient Medications  Medication Sig Dispense Refill   atorvastatin (LIPITOR) 40 MG tablet Take 40 mg by mouth every  evening.      amitriptyline (ELAVIL) 25 MG tablet TAKE 1 TABLET BY MOUTH AT  BEDTIME 90 tablet 3   amLODipine (NORVASC) 10 MG tablet Take 10 mg by mouth every morning.      Ascorbic Acid (VITAMIN C) 1000 MG tablet Take 1,000 mg by mouth every morning.      aspirin EC 81 MG tablet Take 81 mg by mouth daily.     azelastine (ASTELIN) 0.1 % nasal spray Place 1 spray into both nostrils  every morning. Use in each nostril as directed-May use in the evening as needed     B Complex-C (SUPER B COMPLEX PO) Take 1 tablet by mouth daily.     chlorthalidone (HYGROTON) 25 MG tablet Take 25 mg by mouth daily.     Cholecalciferol (VITAMIN D-3) 25 MCG (1000 UT) CAPS Take 1,000 Units by mouth daily.     clobetasol cream (TEMOVATE) 0.05 % Apply 1 application  topically daily as needed (irritation). 2-3 times per week.     Coenzyme Q10 300 MG CAPS Take 300 mg by mouth daily.     enalapril (VASOTEC) 20 MG tablet Take 20 mg by mouth 2 (two) times daily.     fexofenadine (ALLEGRA) 180 MG tablet Take 180 mg by mouth every morning.      fluticasone (CUTIVATE) 0.05 % cream Apply 1 Application topically 2 (two) times daily as needed (irritation).     fluticasone (FLONASE) 50 MCG/ACT nasal spray Place 1 spray into both nostrils daily.     folic acid (FOLVITE) 800 MCG tablet Take 6,400 mcg by mouth daily.     Glucosamine HCl 1500 MG TABS Take 3,000 mg by mouth daily.     hydrALAZINE (APRESOLINE) 10 MG tablet Take 10 mg by mouth 3 (three) times daily.     ketoconazole (NIZORAL) 2 % cream Apply 1 application  topically daily as needed for irritation. 2-3 times per week.     levothyroxine (SYNTHROID) 50 MCG tablet Take 50 mcg by mouth daily before breakfast.     meloxicam (MOBIC) 15 MG tablet Take 15 mg by mouth daily as needed for pain.     metFORMIN (GLUCOPHAGE) 500 MG tablet Take 500 mg by mouth every evening.      Multiple Vitamin (MULTIVITAMIN WITH MINERALS) TABS tablet Take 1 tablet by mouth daily.     naphazoline-pheniramine (NAPHCON-A) 0.025-0.3 % ophthalmic solution Place 1-2 drops into both eyes daily.     Omega-3 Fatty Acids (FISH OIL ULTRA) 1400 MG CAPS Take 1,400 mg by mouth daily.     Polyethyl Glycol-Propyl Glycol 0.4-0.3 % SOLN Place 1-2 drops into both eyes daily.     Turmeric (QC TUMERIC COMPLEX PO) Take 3,000 mg by mouth daily.     No current facility-administered medications for  this visit.    Allergies as of 10/07/2023   (No Known Allergies)    Family History  Problem Relation Age of Onset   Diabetes Father    Diabetes Maternal Grandfather     Social History   Socioeconomic History   Marital status: Divorced    Spouse name: Not on file   Number of children: Not on file   Years of education: Not on file   Highest education level: Not on file  Occupational History   Not on file  Tobacco Use   Smoking status: Former    Current packs/day: 0.00    Types: Cigarettes    Start date: 07/23/1964    Quit date:  07/23/2000    Years since quitting: 23.2    Passive exposure: Past   Smokeless tobacco: Never  Vaping Use   Vaping status: Never Used  Substance and Sexual Activity   Alcohol use: Yes    Comment: moderate   Drug use: Never   Sexual activity: Not on file  Other Topics Concern   Not on file  Social History Narrative   Not on file   Social Drivers of Health   Financial Resource Strain: Not on file  Food Insecurity: Not on file  Transportation Needs: Not on file  Physical Activity: Not on file  Stress: Not on file  Social Connections: Not on file    Review of systems General: negative for malaise, night sweats, fever, chills, weight loss Neck: Negative for lumps, goiter, pain and significant neck swelling Resp: Negative for cough, wheezing, dyspnea at rest CV: Negative for chest pain, leg swelling, palpitations, orthopnea GI: denies melena, hematochezia, nausea, vomiting,  constipation, dysphagia, odyonophagia, early satiety or unintentional weight loss.  + Diarrhea + globus sensation The remainder of the review of systems is noncontributory.  Physical Exam: BP 133/75   Pulse 68   Temp 98.4 F (36.9 C)   Ht 5\' 5"  (1.651 m)   Wt 189 lb 8 oz (86 kg)   BMI 31.53 kg/m  General:   Alert and oriented. No distress noted. Pleasant and cooperative.  Head:  Normocephalic and atraumatic. Eyes:  Conjuctiva clear without scleral  icterus. Mouth:  Oral mucosa pink and moist. Good dentition. No lesions. Heart: Normal rate and rhythm, s1 and s2 heart sounds present.  Lungs: Clear lung sounds in all lobes. Respirations equal and unlabored. Abdomen:  +BS, soft, non-tender and non-distended. No rebound or guarding. No HSM or masses noted. Neurologic:  Alert and  oriented x4 Psych:  Alert and cooperative. Normal mood and affect.  Invalid input(s): "6 MONTHS"   ASSESSMENT: [name] Boyle is a 73 y.o. male presenting today for follow-up of globus sensation and diarrhea  Globus sensation: Remains about 90% improved with amitriptyline 25 mg at bedtime.  As of now he has no associated side effects with the medication.  Will continue with current regimen.  He did inquire if there is anything else that can be done to resolve the symptoms completely, we discussed referral to ENT though not sure they would be able to provide much to him in this case as I feel that globus sensation is more functional given the above testing.  At this time he declines referral to ENT.  Diarrhea: Onset July after stopping PPI therapy.  Initially greater this was rebound diarrhea after cessation of PPI though gastrin level after last visit was normal, diarrhea has persisted and he is not having to take Imodium a few times a week to keep stools more solid.  He has no abdominal pain, rectal bleeding, melena, weight loss.  At this time etiology of his diarrhea is unclear, cannot rule out presence of microscopic colitis especially in presence of TCA use which can sometimes precipitate this, would recommend proceeding with colonoscopy due to change in bowel habits. Indications, risks and benefits of procedure discussed in detail with patient. Patient verbalized understanding and is in agreement to proceed with colonoscopy   PLAN:  -continue with elavil 25 mg nightly -schedule colonoscopy ASA 3 -continue with imodium PRN   All questions were answered, patient  verbalized understanding and is in agreement with plan as outlined above.   Follow Up: 4 months  Ceceilia Cephus L. Jeanmarie Hubert, MSN, APRN, AGNP-C Adult-Gerontology Nurse Practitioner [facility] for GI Diseases  I have reviewed the note and agree with the APP's assessment as described in this progress note  Katrinka Blazing, MD Gastroenterology and Hepatology [facility]astro[facility]

## 2023-10-07 NOTE — Patient Instructions (Signed)
Please continue amitriptyline 25 mg at night for now As I am unsure of the source of your diarrhea, Recommend proceeding colonoscopy for further evaluation to rule out underlying causes that need further treatment  Follow-up 4 months  It was a pleasure to see you today. I want to create trusting relationships with patients and provide genuine, compassionate, and quality care. I truly value your feedback! please be on the lookout for a survey regarding your visit with me today. I appreciate your input about our visit and your time in completing this!    Kendarius Vigen L. Jeanmarie Hubert, MSN, APRN, AGNP-C Adult-Gerontology Nurse Practitioner [facility] at [facility]

## 2023-10-08 ENCOUNTER — Encounter (INDEPENDENT_AMBULATORY_CARE_PROVIDER_SITE_OTHER): Payer: Self-pay

## 2023-10-09 ENCOUNTER — Telehealth (INDEPENDENT_AMBULATORY_CARE_PROVIDER_SITE_OTHER): Payer: Self-pay | Admitting: Gastroenterology

## 2023-10-09 NOTE — Telephone Encounter (Signed)
Pt contacted office and states he needs to change his TCS appt. Pt originally scheduled for 10/22/23 at 9:45. Pt has been rescheduled to 11/05/23 at 1:30pm. Pt would like new instructions emailed to him. Will email new instructions with new pre op appt once received.

## 2023-10-10 NOTE — Telephone Encounter (Signed)
Emailed and mailed new instructions to pt.

## 2023-10-11 ENCOUNTER — Ambulatory Visit (HOSPITAL_COMMUNITY): Payer: 59

## 2023-10-11 DIAGNOSIS — M6281 Muscle weakness (generalized): Secondary | ICD-10-CM

## 2023-10-11 DIAGNOSIS — M48061 Spinal stenosis, lumbar region without neurogenic claudication: Secondary | ICD-10-CM

## 2023-10-11 DIAGNOSIS — M545 Low back pain, unspecified: Secondary | ICD-10-CM

## 2023-10-11 NOTE — Therapy (Signed)
OUTPATIENT PHYSICAL THERAPY THORACOLUMBAR EVALUATION   Patient Name: [name] Boyle MRN: [id number] DOB:1950-11-11, 73 y.o., male Today's Date: 10/11/2023  END OF SESSION:  PT End of Session - 10/11/23 0804     Visit Number 5    Date for PT Re-Evaluation 10/25/23    Authorization Type UHC    Authorization Time Period Federal [employer]- no auth required    Authorization - Number of Visits 20    Progress Note Due on Visit 6    PT Start Time 0804    PT Stop Time 0844    PT Time Calculation (min) 40 min    Activity Tolerance Patient tolerated treatment well    Behavior During Therapy WFL for tasks assessed/performed              Past Medical History:  Diagnosis Date   Diabetes mellitus without complication (HCC)    GERD (gastroesophageal reflux disease)    High cholesterol    Hypertension    Past Surgical History:  Procedure Laterality Date   COLONOSCOPY N/A 03/26/2019   Procedure: COLONOSCOPY;  Surgeon: Malissa Hippo, MD;  [facility];  Service: Endoscopy;  Laterality: N/A;  12:00   ESOPHAGOGASTRODUODENOSCOPY N/A 08/28/2018   Procedure: ESOPHAGOGASTRODUODENOSCOPY (EGD);  Surgeon: Malissa Hippo, MD;  [facility];  Service: Endoscopy;  Laterality: N/A;  2:00   ESOPHAGOGASTRODUODENOSCOPY (EGD) WITH PROPOFOL N/A 03/20/2022   Procedure: ESOPHAGOGASTRODUODENOSCOPY (EGD) WITH PROPOFOL;  Surgeon: Dolores Frame, MD;  [facility];  Service: Gastroenterology;  Laterality: N/A;  155 ASA 2   EXPLORATORY LAPAROTOMY  07/2017   ? partial malrotation- [facility]    KNEE SURGERY     LAPAROTOMY N/A 07/21/2018   Procedure: EXPLORATORY LAPAROTOMY, SMALL BOWEL RESECTION;  Surgeon: [name], MD;  Location: [facility];  Service: General;  Laterality: N/A;   LYSIS OF ADHESION N/A 07/21/2018   Procedure: LYSIS OF ADHESION;  Surgeon: [name], MD;  Location: [facility];  Service: General;  Laterality: N/A;   Patient Active Problem List    Diagnosis Date Noted   Diarrhea 04/04/2023   Globus sensation 05/24/2022   History of colonic polyps 02/18/2019   Intestinal adhesions with partial obstruction (HCC)    Hypertension 07/17/2018   Type 2 diabetes mellitus (HCC) 07/17/2018   Hyperlipidemia 07/17/2018   SBO (small bowel obstruction) (HCC) 07/08/2018   Gastroesophageal reflux disease without esophagitis 06/20/2018   Small bowel obstruction (HCC) 06/09/2018    PCP: Pearson Grippe MD  REFERRING PROVIDER: Romero Belling, MD  REFERRING DIAG: LEFT LEG RAD & PAIN LT HIP OA, 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  ONSET DATE: 4 months  SUBJECTIVE:  SUBJECTIVE STATEMENT: Today: Patient with no increase in pain today.   Eval: Pt reports Low back pain that radiates into left hip and distally into proximal hamstring. Pt reports injuring initially while playing tennis 4 months ago. Pt reports he is doing something active and sport related daily. Pt reports that pain is most noticeable when walking up hills, running, and playing tennis and prolonged sitting/rest breaks. Intermittent in nature. Worst is 7-8/10 with about two day of consistent low level pain.    PERTINENT HISTORY:  Severe Ankle sprain  Bilateral quad tendon rupture repair 2008   PAIN:  Are you having pain? Yes: NPRS scale: 7-8/10 Pain location: Low back-left side into proximal left hamstring Pain description: Constantly sharp Aggravating factors: running, tennis, walking uphill Relieving factors: Rest and prescribed anti-inflammatory  PRECAUTIONS: None  RED FLAGS: None   WEIGHT BEARING RESTRICTIONS: No  FALLS:  Has patient fallen in last 6 months? No  PATIENT GOALS: 'Play tennis for 3 hours  without stopping for pain'   OBJECTIVE:  Note: Objective measures were completed at Evaluation unless otherwise noted.  DIAGNOSTIC FINDINGS:  IMPRESSION: 1. Moderate to moderately severe central canal stenosis and bilateral subarticular recess narrowing at L4-5 due to advanced facet arthropathy, a shallow disc bulge and ligamentum flavum thickening. Mild bilateral foraminal narrowing is also present at this level. 2. Mild bilateral foraminal narrowing at L5-S1 is worse on the left.     Electronically Signed   By: Drusilla Kanner M.D.   On: 08/07/2023 09:51  PATIENT SURVEYS:  FOTO 61.13 FOTO: 70.35 COGNITION: Overall cognitive status: Within functional limits for tasks assessed     SENSATION: WFL   POSTURE: decreased lumbar lordosis  PALPATION: Non tender to palpate along Lumbar paraspinals and L gluteus medius  LUMBAR ROM:   AROM eval 10/04/2023 Re-evaluation  Flexion 100%   Extension 100%   Right lateral flexion 75% 90%  Left lateral flexion 75% 90%  Right rotation 60% 75%  Left rotation 60% 75%   (Blank rows = not tested)  LOWER EXTREMITY ROM:     Active  Right eval Left eval Right 10/04/2023 Left 10/04/2023  Hip flexion      Hip extension      Hip abduction      Hip adduction      Hip internal rotation 15 15 20 20   Hip external rotation WNL WNL    Knee flexion      Knee extension      Ankle dorsiflexion      Ankle plantarflexion      Ankle inversion      Ankle eversion       (Blank rows = not tested)  LOWER EXTREMITY MMT:    MMT Right eval Left eval Right 10/04/2023 Left 10/04/2023  Hip flexion 4- 4- 4 4  Hip extension 4- 4- 4- 4-  Hip abduction 3+ 3 4- 4-  Hip adduction      Hip internal rotation      Hip external rotation      Knee flexion      Knee extension 4+ 4- 5 5  Ankle dorsiflexion      Ankle plantarflexion      Ankle inversion      Ankle eversion       (Blank rows = not tested)  LUMBAR SPECIAL TESTS:  Straight leg raise  test: Negative and Slump test: Negative  FUNCTIONAL TESTS:  30 seconds chair stand test: 16x   GAIT: Distance walked: 72ft  and 141ft jogging Assistive device utilized: None Level of assistance: Complete Independence Comments: trendelenberg more noticeable during jogging with increased internal rotation of femur during bilateral swing phase with decreased cadence and lateral LE landing.   TREATMENT DATE:  10/11/23 HEP modification/review-see below 1/2 kneel hip flexor stretch on chair Seated hip IR with red theraband Hip hinges with Tidal Tank to facilitate bending Hip hinges with Tidal Tank + lo to hi chop to facilitate bending/swinging     10/04/2023  -FOTO -MMT -ROM -Hip IR standing excursions @ // bars 3 x 30'' -Half kneeling hip mobility IR in multiplanar movements 3 x30'' bilaterally -bodycraft walkouts multiplanar x 3 with #4 plate  1/61/09 Supine  Single leg hip bridges 2x10 each Standing Double leg hip hinges with Tidal Tank @ 10# 2x10 Staggered stance hip hinges with Tidal Tank @ 10# x10 Hip abduction/ extension with 3# 2x10  Elliptical(retro) cooldown 2-3 min    09/20/23 Supine  Left hip manual therapy: mobilization with movement into flexion, IR to improve ROM  Right sidelying reverse clams with G theraband 2x10  Right single leg clamshells with G theraband 2x10  Supine hip flexion with G theraband 2x10   PT Evaluation and HEP below                                                                                                                            PATIENT EDUCATION:  Education details: PT Evaluation, findings, prognosis, frequency, attendance policy, and HEP if given.  Person educated: Patient Education method: Explanation, Demonstration, Tactile cues, and Verbal cues Education comprehension: verbalized understanding and returned demonstration  HOME EXERCISE PROGRAM: Access Code: [email or web address] URL: [email or web address] Date:  10/11/2023 Prepared by: Seymour Bars  Exercises - Supine Hip Flexion with Resistance Loop  - 3-5 x weekly - 3 sets - 10 reps - Supine Bridge with Resistance Band  - 3-5 x weekly - 3 sets - 10 reps - Clamshell with Resistance  - 3-5 x weekly - 3 sets - 10 reps - Sidelying Reverse Clamshell with Resistance  - 3-5 x weekly - 3 sets - 10 reps - Seated Hip Flexor Stretch  - 3-5 x weekly - 3 sets - 20 seconds hold - Seated Hip Internal Rotation with Resistance  - 3-5 x weekly - 3 sets - 10 reps  ASSESSMENT:  CLINICAL IMPRESSION: Pt tolerating session well. Patient reports being unable to do kneeling hip flexor stretch for HEP due to knee pain; also states he "does not feel" prone hip IR active range of motion. PT modified hip flexor stretch using a chair; PT modified hip IR active range of motion to be seated with theraband. PT consolidated/finalized HEP-see below.  Patient will continue to benefit from PT to return to prior level of function.      OBJECTIVE IMPAIRMENTS: Abnormal gait, decreased mobility, difficulty walking, decreased ROM, decreased strength, postural dysfunction, and pain.   ACTIVITY LIMITATIONS: carrying, bending, squatting, and stairs  PARTICIPATION LIMITATIONS:  community activity and occupation  PERSONAL FACTORS: Age are also affecting patient's functional outcome.   REHAB POTENTIAL: Excellent  CLINICAL DECISION MAKING: Stable/uncomplicated  EVALUATION COMPLEXITY: Low   GOALS: Goals reviewed with patient? No  SHORT TERM GOALS: Target date: 10/04/2023  Pt will be independent with HEP in order to demonstrate participation in Physical Therapy POC.  Baseline: Goal status: INITIAL  2.  Pt will report 5/10 pain with mobility in order to demonstrate improved pain with functional activities.  Baseline:  Goal status: INITIAL  LONG TERM GOALS: Target date: 10/25/2023  Pt will improve Lumbar rotation ROM by 10% in order to demonstrate improved functional ROM to  return to desired activities.  Baseline: See objective.  Goal status: INITIAL  2.  Pt will report tolerating 2 hours of strenous sport activities with reported pain <4/10 pain in order to demonstrate improved functional dynamic sporting capacity in community setting.  Baseline: See objective.  Goal status: INITIAL  3.  Pt will improve FOTO score by 10 points in order to demonstrate improved pain with functional goals and outcomes. Baseline: See objective.  Goal status: INITIAL  4.  Pt will report 2/10 pain with mobility in order to demonstrate reduced pain with overall functional status.  Baseline: See objective.  Goal status: INITIAL  5.  Pt will increased bilateral hip MMT by > 1/2 grade in order to improve musculature endurance during sport-like activities.  Baseline: See objective.  Goal status: INITIAL  PLAN:  PT FREQUENCY: 1x/week  PT DURATION: 6 weeks  PLANNED INTERVENTIONS: 97164- PT Re-evaluation, 97110-Therapeutic exercises, 97530- Therapeutic activity, [id number]- Neuromuscular re-education, 97535- Self Care, 96045- Manual therapy, [id number]- Gait training, 97014- Electrical stimulation (unattended), [id number]- Electrical stimulation (manual), and [id number]- Traction (mechanical).  PLAN FOR NEXT SESSION: HEP is good; work on Chief of Staff such as hi-lo/ lo-hi chops, anti-rtion; t  Seymour Bars PT, DPT  [facility]- [place] [phone] office   Seymour Bars, PT 10/11/2023, 8:04 AM

## 2023-10-18 ENCOUNTER — Other Ambulatory Visit (HOSPITAL_COMMUNITY): Payer: 59

## 2023-10-18 ENCOUNTER — Encounter (HOSPITAL_COMMUNITY): Payer: 59

## 2023-10-25 ENCOUNTER — Ambulatory Visit (HOSPITAL_COMMUNITY): Payer: 59 | Attending: Physical Medicine and Rehabilitation

## 2023-10-25 ENCOUNTER — Encounter (HOSPITAL_COMMUNITY): Payer: Self-pay

## 2023-10-25 DIAGNOSIS — M545 Low back pain, unspecified: Secondary | ICD-10-CM | POA: Insufficient documentation

## 2023-10-25 DIAGNOSIS — M48061 Spinal stenosis, lumbar region without neurogenic claudication: Secondary | ICD-10-CM | POA: Insufficient documentation

## 2023-10-25 DIAGNOSIS — M79605 Pain in left leg: Secondary | ICD-10-CM | POA: Insufficient documentation

## 2023-10-25 NOTE — Therapy (Signed)
OUTPATIENT PHYSICAL THERAPY THORACOLUMBAR TREATMENT PHYSICAL THERAPY DISCHARGE SUMMARY  Visits from Start of Care: 6  Current functional level related to goals / functional outcomes: See below   Remaining deficits: Pain   Education / Equipment: HEP Pathoanatomy of stenosis Goals for aging athlete with mobility  Patient agrees to discharge. Patient goals were partially met. Patient is being discharged due to being pleased with the current functional level.   Patient Name: [name] Boyle MRN: [id number] DOB:1950/10/06, 73 y.o., male Today's Date: 10/25/2023  END OF SESSION:  PT End of Session - 10/25/23 0833     Visit Number 6    Date for PT Re-Evaluation 10/25/23    Authorization Type UHC    Authorization Time Period Federal U[employer]- no auth required    Progress Note Due on Visit 6    PT Start Time 0804    PT Stop Time 0833    PT Time Calculation (min) 29 min    Activity Tolerance Patient tolerated treatment well    Behavior During Therapy WFL for tasks assessed/performed               Past Medical History:  Diagnosis Date   Diabetes mellitus without complication (HCC)    GERD (gastroesophageal reflux disease)    High cholesterol    Hypertension    Past Surgical History:  Procedure Laterality Date   COLONOSCOPY N/A 03/26/2019   Procedure: COLONOSCOPY;  Surgeon: Malissa Hippo, MD;  [facility]: [facility];  Service: Endoscopy;  Laterality: N/A;  12:00   ESOPHAGOGASTRODUODENOSCOPY N/A 08/28/2018   Procedure: ESOPHAGOGASTRODUODENOSCOPY (EGD);  Surgeon: Malissa Hippo, MD;  Location: [facility];  Service: Endoscopy;  Laterality: N/A;  2:00   ESOPHAGOGASTRODUODENOSCOPY (EGD) WITH PROPOFOL N/A 03/20/2022   Procedure: ESOPHAGOGASTRODUODENOSCOPY (EGD) WITH PROPOFOL;  Surgeon: Dolores Frame, MD;  Location: [facility];  Service: Gastroenterology;  Laterality: N/A;  155 ASA 2   EXPLORATORY LAPAROTOMY  07/2017   ? partial malrotation- [facility]     KNEE SURGERY     LAPAROTOMY N/A 07/21/2018   Procedure: EXPLORATORY LAPAROTOMY, SMALL BOWEL RESECTION;  Surgeon: [name], MD;  Location: [facility];  Service: General;  Laterality: N/A;   LYSIS OF ADHESION N/A 07/21/2018   Procedure: LYSIS OF ADHESION;  Surgeon: [name], MD;  Location: [facility];  Service: General;  Laterality: N/A;   Patient Active Problem List   Diagnosis Date Noted   Diarrhea 04/04/2023   Globus sensation 05/24/2022   History of colonic polyps 02/18/2019   Intestinal adhesions with partial obstruction (HCC)    Hypertension 07/17/2018   Type 2 diabetes mellitus (HCC) 07/17/2018   Hyperlipidemia 07/17/2018   SBO (small bowel obstruction) (HCC) 07/08/2018   Gastroesophageal reflux disease without esophagitis 06/20/2018   Small bowel obstruction (HCC) 06/09/2018    PCP: Pearson Grippe MD  REFERRING PROVIDER: Romero Belling, MD  REFERRING DIAG: LEFT LEG RAD & PAIN LT HIP OA, SPINAL STENOSIS @ L4-5   Rationale for Evaluation and Treatment: Rehabilitation  THERAPY DIAG:  Lumbar pain with radiation down left leg  Spinal stenosis at L4-L5 level  ONSET DATE: 4 months  SUBJECTIVE:  SUBJECTIVE STATEMENT: Today: Pt reporting no pain currently. Played tennis for 45 minutes last week, worst pain got was 4/10.  Eval: Pt reports Low back pain that radiates into left hip and distally into proximal hamstring. Pt reports injuring initially while playing tennis 4 months ago. Pt reports he is doing something active and sport related daily. Pt reports that pain is most noticeable when walking up hills, running, and playing tennis and prolonged sitting/rest breaks. Intermittent in nature. Worst is 7-8/10 with about two day of consistent low level pain.    PERTINENT HISTORY:  Severe  Ankle sprain  Bilateral quad tendon rupture repair 2008   PAIN:  Are you having pain? Yes: NPRS scale: 7-8/10 Pain location: Low back-left side into proximal left hamstring Pain description: Constantly sharp Aggravating factors: running, tennis, walking uphill Relieving factors: Rest and prescribed anti-inflammatory  PRECAUTIONS: None  RED FLAGS: None   WEIGHT BEARING RESTRICTIONS: No  FALLS:  Has patient fallen in last 6 months? No  PATIENT GOALS: 'Play tennis for 3 hours without stopping for pain'   OBJECTIVE:  Note: Objective measures were completed at Evaluation unless otherwise noted.  DIAGNOSTIC FINDINGS:  IMPRESSION: 1. Moderate to moderately severe central canal stenosis and bilateral subarticular recess narrowing at L4-5 due to advanced facet arthropathy, a shallow disc bulge and ligamentum flavum thickening. Mild bilateral foraminal narrowing is also present at this level. 2. Mild bilateral foraminal narrowing at L5-S1 is worse on the left.     Electronically Signed   By: Drusilla Kanner M.D.   On: 08/07/2023 09:51  PATIENT SURVEYS:  FOTO 61.13 FOTO: 70.35 COGNITION: Overall cognitive status: Within functional limits for tasks assessed     SENSATION: WFL   POSTURE: decreased lumbar lordosis  PALPATION: Non tender to palpate along Lumbar paraspinals and L gluteus medius  LUMBAR ROM:   AROM eval 10/04/2023 Re-evaluation  Flexion 100%   Extension 100%   Right lateral flexion 75% 90%  Left lateral flexion 75% 90%  Right rotation 60% 75%  Left rotation 60% 75%   (Blank rows = not tested)  LOWER EXTREMITY ROM:     Active  Right eval Left eval Right 10/04/2023 Left 10/04/2023  Hip flexion      Hip extension      Hip abduction      Hip adduction      Hip internal rotation 15 15 20 20   Hip external rotation WNL WNL    Knee flexion      Knee extension      Ankle dorsiflexion      Ankle plantarflexion      Ankle inversion      Ankle  eversion       (Blank rows = not tested)  LOWER EXTREMITY MMT:    MMT Right eval Left eval Right 10/04/2023 Left 10/04/2023 10/25/2023 Right 10/25/2023 Left  Hip flexion 4- 4- 4 4 4+ 4+  Hip extension 4- 4- 4- 4- 4 4  Hip abduction 3+ 3 4- 4- 4 4  Hip adduction        Hip internal rotation        Hip external rotation        Knee flexion        Knee extension 4+ 4- 5 5 5 5   Ankle dorsiflexion        Ankle plantarflexion        Ankle inversion        Ankle eversion         (  Blank rows = not tested)  LUMBAR SPECIAL TESTS:  Straight leg raise test: Negative and Slump test: Negative  FUNCTIONAL TESTS:  30 seconds chair stand test: 16x   GAIT: Distance walked: 78ft and 138ft jogging Assistive device utilized: None Level of assistance: Complete Independence Comments: trendelenberg more noticeable during jogging with increased internal rotation of femur during bilateral swing phase with decreased cadence and lateral LE landing.   TREATMENT DATE: 10/25/2023  -FOTO: 76.16 -MMT -30 Second Chair Stand Test: 24x -Education regarding inclusion of finalized HEP into daily activities, modifications for daily stretches. Education regarding pathoanatomy of stenosis and mobility goals for the aging athlete.   10/11/23 HEP modification/review-see below 1/2 kneel hip flexor stretch on chair Seated hip IR with red theraband Hip hinges with Tidal Tank to facilitate bending Hip hinges with Tidal Tank + lo to hi chop to facilitate bending/swinging     10/04/2023  -FOTO -MMT -ROM -Hip IR standing excursions @ // bars 3 x 30'' -Half kneeling hip mobility IR in multiplanar movements 3 x30'' bilaterally -bodycraft walkouts multiplanar x 3 with #4 plate    PATIENT EDUCATION:  Education details: PT Evaluation, findings, prognosis, frequency, attendance policy, and HEP if given.  Person educated: Patient Education method: Explanation, Demonstration, Tactile cues, and Verbal  cues Education comprehension: verbalized understanding and returned demonstration  HOME EXERCISE PROGRAM: Access Code: [email or web address] URL: [email or web address] Date: 10/11/2023 Prepared by: Seymour Bars  Exercises - Supine Hip Flexion with Resistance Loop  - 3-5 x weekly - 3 sets - 10 reps - Supine Bridge with Resistance Band  - 3-5 x weekly - 3 sets - 10 reps - Clamshell with Resistance  - 3-5 x weekly - 3 sets - 10 reps - Sidelying Reverse Clamshell with Resistance  - 3-5 x weekly - 3 sets - 10 reps - Seated Hip Flexor Stretch  - 3-5 x weekly - 3 sets - 20 seconds hold - Seated Hip Internal Rotation with Resistance  - 3-5 x weekly - 3 sets - 10 reps  ASSESSMENT:  CLINICAL IMPRESSION: Pt with moderate progress toward all goals, ultimately, pain levels remains elevated but do not anticipate for pain to be 0/10 as for pt's x-ray findings. Pt understood and no complaints, pt reports no concerns with managing advanced HEP. Pt is pleased with current progress. Pt to be discharged from skilled PT services at this time.     OBJECTIVE IMPAIRMENTS: Abnormal gait, decreased mobility, difficulty walking, decreased ROM, decreased strength, postural dysfunction, and pain.   ACTIVITY LIMITATIONS: carrying, bending, squatting, and stairs  PARTICIPATION LIMITATIONS: community activity and occupation  PERSONAL FACTORS: Age are also affecting patient's functional outcome.   REHAB POTENTIAL: Excellent  CLINICAL DECISION MAKING: Stable/uncomplicated  EVALUATION COMPLEXITY: Low   GOALS: Goals reviewed with patient? No  SHORT TERM GOALS: Target date: 10/04/2023  Pt will be independent with HEP in order to demonstrate participation in Physical Therapy POC.  Baseline: Goal status: INITIAL  2.  Pt will report 5/10 pain with mobility in order to demonstrate improved pain with functional activities.  Baseline:  Goal status: INITIAL  LONG TERM GOALS: Target date: 10/25/2023  Pt  will improve Lumbar rotation ROM by 10% in order to demonstrate improved functional ROM to return to desired activities.  Baseline: See objective.  Goal status: INITIAL  2.  Pt will report tolerating 2 hours of strenous sport activities with reported pain <4/10 pain in order to demonstrate improved functional dynamic sporting capacity in community setting.  Baseline: See objective.  Goal status: INITIAL  3.  Pt will improve FOTO score by 10 points in order to demonstrate improved pain with functional goals and outcomes. Baseline: See objective.  Goal status: INITIAL  4.  Pt will report 2/10 pain with mobility in order to demonstrate reduced pain with overall functional status.  Baseline: See objective.  Goal status: INITIAL  5.  Pt will increased bilateral hip MMT by > 1/2 grade in order to improve musculature endurance during sport-like activities.  Baseline: See objective.  Goal status: INITIAL  PLAN:  PT FREQUENCY: 1x/week  PT DURATION: 6 weeks  PLANNED INTERVENTIONS: 97164- PT Re-evaluation, 97110-Therapeutic exercises, 97530- Therapeutic activity, [id number]- Neuromuscular re-education, 97535- Self Care, 82956- Manual therapy, [id number]- Gait training, 97014- Electrical stimulation (unattended), [id number]- Electrical stimulation (manual), and [id number]- Traction (mechanical).  PLAN FOR NEXT SESSION: DC.   Elie Goody, DPT [facility] [place] [phone] office   Nelida Meuse, PT 10/25/2023, 8:34 AM

## 2023-10-29 ENCOUNTER — Encounter (HOSPITAL_COMMUNITY): Payer: Self-pay

## 2023-10-29 ENCOUNTER — Encounter (HOSPITAL_COMMUNITY)
Admission: RE | Admit: 2023-10-29 | Discharge: 2023-10-29 | Disposition: A | Discharge status: Home / Self Care | Payer: 59 | Source: Ambulatory Visit | Attending: Gastroenterology

## 2023-10-29 DIAGNOSIS — I10 Essential (primary) hypertension: Secondary | ICD-10-CM | POA: Insufficient documentation

## 2023-10-29 DIAGNOSIS — Z01818 Encounter for other preprocedural examination: Secondary | ICD-10-CM | POA: Diagnosis present

## 2023-10-29 DIAGNOSIS — D649 Anemia, unspecified: Secondary | ICD-10-CM | POA: Diagnosis not present

## 2023-10-29 DIAGNOSIS — Z79899 Other long term (current) drug therapy: Secondary | ICD-10-CM | POA: Diagnosis not present

## 2023-10-29 DIAGNOSIS — E119 Type 2 diabetes mellitus without complications: Secondary | ICD-10-CM | POA: Insufficient documentation

## 2023-10-29 LAB — BASIC METABOLIC PANEL
Anion gap: 12 (ref 5–15)
BUN: 23 mg/dL (ref 8–23)
CO2: 22 mmol/L (ref 22–32)
Calcium: 9.6 mg/dL (ref 8.9–10.3)
Chloride: 105 mmol/L (ref 98–111)
Creatinine, Ser: 1.33 mg/dL — ABNORMAL HIGH (ref 0.61–1.24)
GFR, Estimated: 56 mL/min — ABNORMAL LOW (ref >60)
Glucose, Bld: 104 mg/dL — ABNORMAL HIGH (ref 70–99)
Potassium: 4.1 mmol/L (ref 3.5–5.1)
Sodium: 139 mmol/L (ref 135–145)

## 2023-10-29 LAB — CBC WITH DIFFERENTIAL/PLATELET
Abs Immature Granulocytes: 0.01 10*3/uL (ref 0.00–0.07)
Basophils Absolute: 0.1 10*3/uL (ref 0.0–0.1)
Basophils Relative: 1 %
Eosinophils Absolute: 0.2 10*3/uL (ref 0.0–0.5)
Eosinophils Relative: 3 %
HCT: 35.3 % — ABNORMAL LOW (ref 39.0–52.0)
Hemoglobin: 12.6 g/dL — ABNORMAL LOW (ref 13.0–17.0)
Immature Granulocytes: 0 %
Lymphocytes Relative: 22 %
Lymphs Abs: 1.2 10*3/uL (ref 0.7–4.0)
MCH: 32.6 pg (ref 26.0–34.0)
MCHC: 35.7 g/dL (ref 30.0–36.0)
MCV: 91.5 fL (ref 80.0–100.0)
Monocytes Absolute: 0.6 10*3/uL (ref 0.1–1.0)
Monocytes Relative: 12 %
Neutro Abs: 3.4 10*3/uL (ref 1.7–7.7)
Neutrophils Relative %: 62 %
Platelets: 295 10*3/uL (ref 150–400)
RBC: 3.86 MIL/uL — ABNORMAL LOW (ref 4.22–5.81)
RDW: 13.4 % (ref 11.5–15.5)
WBC: 5.6 10*3/uL (ref 4.0–10.5)
nRBC: 0 % (ref 0.0–0.2)

## 2023-10-29 NOTE — Patient Instructions (Signed)
 [name] Boyle  10/29/2023     @PREFPERIOPPHARMACY @   Your procedure is scheduled on  11/05/2023.    Report to [facility] at  1200  P.M.   Call this number if you have problems the morning of surgery:  [phone]  If you experience any cold or flu symptoms such as cough, fever, chills, shortness of breath, etc. between now and your scheduled surgery, please notify us at the above number.   Remember:  Follow the diet and prep instructions given to you by the office.    You may drink clear liquids until 0955 am on 11/05/2023.     Clear liquids allowed are:                    Water, Juice (No red color; non-citric and without pulp; diabetics please choose diet or no sugar options), Carbonated beverages (diabetics please choose diet or no sugar options), Clear Tea (No creamer, milk, or cream, including half & half and powdered creamer), Black Coffee Only (No creamer, milk or cream, including half & half and powdered creamer), Plain Jell-O Only (No red color; diabetics please choose no sugar options), Clear Sports drink (No red color; diabetics please choose diet or no sugar options), and Plain Popsicles Only (No red color; diabetics please choose no sugar options)     Take these medicines the morning of surgery with A SIP OF WATER           amlodipine, voltaren (if needed and tolerated), levothyroxine.     Do not wear jewelry, make-up or nail polish, including gel polish,  artificial nails, or any other type of covering on natural nails (fingers and  toes).  Do not wear lotions, powders, or perfumes, or deodorant.  Do not shave 48 hours prior to surgery.  Men may shave face and neck.  Do not bring valuables to the hospital.  [facility] is not responsible for any belongings or valuables.  Contacts, dentures or bridgework may not be worn into surgery.  Leave your suitcase in the car.  After surgery it may be brought to your room.  For patients admitted to the hospital,  discharge time will be determined by your treatment team.  Patients discharged the day of surgery will not be allowed to drive home and must have someone with them for 24 hours.     Special instructions:   DO NOT smoke tobacco or vape for 24 hours before your procedure.  Please read over the following fact sheets that you were given. Anesthesia Post-op Instructions and Care and Recovery After Surgery       Colonoscopy, Adult, Care After The following information offers guidance on how to care for yourself after your procedure. Your health care provider may also give you more specific instructions. If you have problems or questions, contact your health care provider. What can I expect after the procedure? After the procedure, it is common to have: A small amount of blood in your stool for 24 hours after the procedure. Some gas. Mild cramping or bloating of your abdomen. Follow these instructions at home: Eating and drinking  Drink enough fluid to keep your urine pale yellow. Follow instructions from your health care provider about eating or drinking restrictions. Resume your normal diet as told by your health care provider. Avoid heavy or fried foods that are hard to digest. Activity Rest as told by your health care provider. Avoid sitting for a long time  without moving. Get up to take short walks every 1-2 hours. This is important to improve blood flow and breathing. Ask for help if you feel weak or unsteady. Return to your normal activities as told by your health care provider. Ask your health care provider what activities are safe for you. Managing cramping and bloating  Try walking around when you have cramps or feel bloated. If directed, apply heat to your abdomen as told by your health care provider. Use the heat source that your health care provider recommends, such as a moist heat pack or a heating pad. Place a towel between your skin and the heat source. Leave the heat on  for 20-30 minutes. Remove the heat if your skin turns bright red. This is especially important if you are unable to feel pain, heat, or cold. You have a greater risk of getting burned. General instructions If you were given a sedative during the procedure, it can affect you for several hours. Do not drive or operate machinery until your health care provider says that it is safe. For the first 24 hours after the procedure: Do not sign important documents. Do not drink alcohol. Do your regular daily activities at a slower pace than normal. Eat soft foods that are easy to digest. Take over-the-counter and prescription medicines only as told by your health care provider. Keep all follow-up visits. This is important. Contact a health care provider if: You have blood in your stool 2-3 days after the procedure. Get help right away if: You have more than a small spotting of blood in your stool. You have large blood clots in your stool. You have swelling of your abdomen. You have nausea or vomiting. You have a fever. You have increasing pain in your abdomen that is not relieved with medicine. These symptoms may be an emergency. Get help right away. Call 911. Do not wait to see if the symptoms will go away. Do not drive yourself to the hospital. Summary After the procedure, it is common to have a small amount of blood in your stool. You may also have mild cramping and bloating of your abdomen. If you were given a sedative during the procedure, it can affect you for several hours. Do not drive or operate machinery until your health care provider says that it is safe. Get help right away if you have a lot of blood in your stool, nausea or vomiting, a fever, or increased pain in your abdomen. This information is not intended to replace advice given to you by your health care provider. Make sure you discuss any questions you have with your health care provider. Document Revised: 10/09/2022 Document  Reviewed: 04/19/2021 Elsevier Patient Education  2024 [employer]Monitored Anesthesia Care, Care After The following information offers guidance on how to care for yourself after your procedure. Your health care provider may also give you more specific instructions. If you have problems or questions, contact your health care provider. What can I expect after the procedure? After the procedure, it is common to have: Tiredness. Little or no memory about what happened during or after the procedure. Impaired judgment when it comes to making decisions. Nausea or vomiting. Some trouble with balance. Follow these instructions at home: For the time period you were told by your health care provider:  Rest. Do not participate in activities where you could fall or become injured. Do not drive or use machinery. Do not drink alcohol. Do not take sleeping pills or medicines that  cause drowsiness. Do not make important decisions or sign legal documents. Do not take care of children on your own. Medicines Take over-the-counter and prescription medicines only as told by your health care provider. If you were prescribed antibiotics, take them as told by your health care provider. Do not stop using the antibiotic even if you start to feel better. Eating and drinking Follow instructions from your health care provider about what you may eat and drink. Drink enough fluid to keep your urine pale yellow. If you vomit: Drink clear fluids slowly and in small amounts as you are able. Clear fluids include water, ice chips, low-calorie sports drinks, and fruit juice that has water added to it (diluted fruit juice). Eat light and bland foods in small amounts as you are able. These foods include bananas, applesauce, rice, lean meats, toast, and crackers. General instructions  Have a responsible adult stay with you for the time you are told. It is important to have someone help care for you until you are awake and  alert. If you have sleep apnea, surgery and some medicines can increase your risk for breathing problems. Follow instructions from your health care provider about wearing your sleep device: When you are sleeping. This includes during daytime naps. While taking prescription pain medicines, sleeping medicines, or medicines that make you drowsy. Do not use any products that contain nicotine or tobacco. These products include cigarettes, chewing tobacco, and vaping devices, such as e-cigarettes. If you need help quitting, ask your health care provider. Contact a health care provider if: You feel nauseous or vomit every time you eat or drink. You feel light-headed. You are still sleepy or having trouble with balance after 24 hours. You get a rash. You have a fever. You have redness or swelling around the IV site. Get help right away if: You have trouble breathing. You have new confusion after you get home. These symptoms may be an emergency. Get help right away. Call 911. Do not wait to see if the symptoms will go away. Do not drive yourself to the hospital. This information is not intended to replace advice given to you by your health care provider. Make sure you discuss any questions you have with your health care provider. Document Revised: 01/22/2022 Document Reviewed: 01/22/2022 Elsevier Patient Education  2024 [employer].

## 2023-11-01 ENCOUNTER — Encounter (HOSPITAL_COMMUNITY): Payer: 59

## 2023-11-05 ENCOUNTER — Encounter (INDEPENDENT_AMBULATORY_CARE_PROVIDER_SITE_OTHER): Payer: Self-pay | Admitting: *Deleted

## 2023-11-05 ENCOUNTER — Encounter (HOSPITAL_COMMUNITY)
Admission: RE | Disposition: A | Discharge status: Home / Self Care | Payer: Self-pay | Source: Home / Self Care | Attending: Gastroenterology

## 2023-11-05 ENCOUNTER — Ambulatory Visit (HOSPITAL_COMMUNITY): Payer: 59 | Admitting: Certified Registered"

## 2023-11-05 ENCOUNTER — Ambulatory Visit (HOSPITAL_COMMUNITY)
Admission: RE | Admit: 2023-11-05 | Discharge: 2023-11-05 | Disposition: A | Discharge status: Home / Self Care | Payer: 59 | Attending: Gastroenterology | Admitting: Gastroenterology

## 2023-11-05 DIAGNOSIS — E119 Type 2 diabetes mellitus without complications: Secondary | ICD-10-CM | POA: Insufficient documentation

## 2023-11-05 DIAGNOSIS — Z7984 Long term (current) use of oral hypoglycemic drugs: Secondary | ICD-10-CM | POA: Insufficient documentation

## 2023-11-05 DIAGNOSIS — K621 Rectal polyp: Secondary | ICD-10-CM

## 2023-11-05 DIAGNOSIS — K648 Other hemorrhoids: Secondary | ICD-10-CM

## 2023-11-05 DIAGNOSIS — I10 Essential (primary) hypertension: Secondary | ICD-10-CM | POA: Insufficient documentation

## 2023-11-05 DIAGNOSIS — D649 Anemia, unspecified: Secondary | ICD-10-CM

## 2023-11-05 DIAGNOSIS — K219 Gastro-esophageal reflux disease without esophagitis: Secondary | ICD-10-CM | POA: Diagnosis not present

## 2023-11-05 DIAGNOSIS — D128 Benign neoplasm of rectum: Secondary | ICD-10-CM | POA: Diagnosis not present

## 2023-11-05 DIAGNOSIS — K635 Polyp of colon: Secondary | ICD-10-CM | POA: Diagnosis not present

## 2023-11-05 DIAGNOSIS — R197 Diarrhea, unspecified: Secondary | ICD-10-CM

## 2023-11-05 DIAGNOSIS — K529 Noninfective gastroenteritis and colitis, unspecified: Secondary | ICD-10-CM | POA: Diagnosis not present

## 2023-11-05 DIAGNOSIS — Z79899 Other long term (current) drug therapy: Secondary | ICD-10-CM | POA: Diagnosis not present

## 2023-11-05 DIAGNOSIS — Z87891 Personal history of nicotine dependence: Secondary | ICD-10-CM | POA: Insufficient documentation

## 2023-11-05 HISTORY — PX: POLYPECTOMY: SHX149

## 2023-11-05 HISTORY — PX: BIOPSY: SHX5522

## 2023-11-05 HISTORY — PX: COLONOSCOPY WITH PROPOFOL: SHX5780

## 2023-11-05 LAB — HM COLONOSCOPY

## 2023-11-05 LAB — GLUCOSE, CAPILLARY: Glucose-Capillary: 111 mg/dL — ABNORMAL HIGH (ref 70–99)

## 2023-11-05 SURGERY — COLONOSCOPY WITH PROPOFOL
Anesthesia: General

## 2023-11-05 MED ORDER — PROPOFOL 10 MG/ML IV BOLUS
INTRAVENOUS | Status: DC | PRN
  Administered 2023-11-05: 80 mg via INTRAVENOUS

## 2023-11-05 MED ORDER — LACTATED RINGERS IV SOLN: INTRAVENOUS | Status: DC | PRN

## 2023-11-05 MED ORDER — SODIUM CHLORIDE 0.9% FLUSH: 3.0000 mL | Freq: Two times a day (BID) | INTRAVENOUS | Status: DC

## 2023-11-05 MED ORDER — PROPOFOL 500 MG/50ML IV EMUL
INTRAVENOUS | Status: DC | PRN
  Administered 2023-11-05: 125 ug/kg/min via INTRAVENOUS

## 2023-11-05 MED ORDER — LIDOCAINE HCL (CARDIAC) PF 100 MG/5ML IV SOSY
PREFILLED_SYRINGE | INTRAVENOUS | Status: DC | PRN
  Administered 2023-11-05: 60 mg via INTRAVENOUS

## 2023-11-05 MED ORDER — SODIUM CHLORIDE 0.9% FLUSH: 3.0000 mL | INTRAVENOUS | Status: DC | PRN

## 2023-11-05 NOTE — Op Note (Signed)
 [facility] Patient Name: [name] Boyle Procedure Date: 11/05/2023 1:12 PM MRN: [id number] Date of Birth: 11-Sep-1950 Attending MD: Katrinka Blazing , , [id number] CSN: [id number] Age: 73 Admit Type: Outpatient Procedure:                Colonoscopy Indications:              Clinically significant diarrhea of unexplained                            origin Providers:                Katrinka Blazing, Nena Polio, RN, Dyann Ruddle Referring MD:              Medicines:                Monitored Anesthesia Care Complications:            No immediate complications. Estimated Blood Loss:     Estimated blood loss: none. Procedure:                Pre-Anesthesia Assessment:                           - Prior to the procedure, a History and Physical                            was performed, and patient medications, allergies                            and sensitivities were reviewed. The patient's                            tolerance of previous anesthesia was reviewed.                           - The risks and benefits of the procedure and the                            sedation options and risks were discussed with the                            patient. All questions were answered and informed                            consent was obtained.                           - ASA Grade Assessment: II - A patient with mild                            systemic disease.                           After obtaining informed consent, the colonoscope                            was passed under direct vision. Throughout the  procedure, the patient's blood pressure, pulse, and                            oxygen saturations were monitored continuously. The                            [id number]-HQ190L ([id number]) scope was introduced through                            the anus and advanced to the the terminal ileum.                            The colonoscopy was performed without difficulty.                             The patient tolerated the procedure well. The                            quality of the bowel preparation was good. Scope In: 1:37:38 PM Scope Out: 1:58:10 PM Scope Withdrawal Time: 0 hours 16 minutes 48 seconds  Total Procedure Duration: 0 hours 20 minutes 32 seconds  Findings:      The perianal and digital rectal examinations were normal.      The terminal ileum appeared normal.      The colon (entire examined portion) appeared normal. Biopsies for       histology were taken with a cold forceps from the right colon and left       colon for evaluation of microscopic colitis.      A 3 mm polyp was found in the rectum. The polyp was sessile. The polyp       was removed with a cold snare. Resection and retrieval were complete.      Non-bleeding internal hemorrhoids were found during retroflexion. The       hemorrhoids were small. Impression:               - The examined portion of the ileum was normal.                           - The entire examined colon is normal. Biopsied.                           - One 3 mm polyp in the rectum, removed with a cold                            snare. Resected and retrieved.                           - Non-bleeding internal hemorrhoids. Moderate Sedation:      Per Anesthesia Care Recommendation:           - Discharge patient to home (ambulatory).                           - Resume previous diet.                           -  Repeat colonoscopy for surveillance based on                            pathology results.                           - Await pathology results.                           - Start taking Benefiber 1 tablespoon daily to                            increase the bulk of the stool. Procedure Code(s):        --- Professional ---                           [phone], Colonoscopy, flexible; with removal of                            tumor(s), polyp(s), or other lesion(s) by snare                            technique                            45380, 59, Colonoscopy, flexible; with biopsy,                            single or multiple Diagnosis Code(s):        --- Professional ---                           K64.8, Other hemorrhoids                           D12.8, Benign neoplasm of rectum                           R19.7, Diarrhea, unspecified CPT copyright 2022 American Medical Association. All rights reserved. The codes documented in this report are preliminary and upon coder review may  be revised to meet current compliance requirements. Katrinka Blazing, MD Katrinka Blazing,  11/05/2023 2:08:38 PM This report has been signed electronically. Number of Addenda: 0

## 2023-11-05 NOTE — Anesthesia Preprocedure Evaluation (Addendum)
 Anesthesia Evaluation  Patient identified by MRN, date of birth, ID band Patient awake    Reviewed: Allergy & Precautions, NPO status , Patient's Chart, lab work & pertinent test results  Airway Mallampati: II  TM Distance: >3 FB Neck ROM: Full    Dental  (+) Dental Advisory Given, Caps All upper front teeth are capped:   Pulmonary former smoker   Pulmonary exam normal breath sounds clear to auscultation       Cardiovascular Exercise Tolerance: Good METS: 3 - Mets hypertension, Normal cardiovascular exam Rhythm:Regular Rate:Normal     Neuro/Psych negative neurological ROS     GI/Hepatic Neg liver ROS,GERD  ,,  Endo/Other  diabetes    Renal/GU negative Renal ROS     Musculoskeletal negative musculoskeletal ROS (+)    Abdominal Normal abdominal exam  (+)   Peds  Hematology negative hematology ROS (+)   Anesthesia Other Findings   Reproductive/Obstetrics                             Anesthesia Physical Anesthesia Plan  ASA: 2  Anesthesia Plan: General   Post-op Pain Management: Minimal or no pain anticipated   Induction: Intravenous  PONV Risk Score and Plan: 1 and Propofol infusion  Airway Management Planned: Nasal Cannula and Natural Airway  Additional Equipment: None  Intra-op Plan:   Post-operative Plan:   Informed Consent: I have reviewed the patients History and Physical, chart, labs and discussed the procedure including the risks, benefits and alternatives for the proposed anesthesia with the patient or authorized representative who has indicated his/her understanding and acceptance.     Dental advisory given  Plan Discussed with: CRNA  Anesthesia Plan Comments:         Anesthesia Quick Evaluation

## 2023-11-05 NOTE — Anesthesia Postprocedure Evaluation (Signed)
 Anesthesia Post Note  Patient: [name] Boyle  Procedure(s) Performed: COLONOSCOPY WITH PROPOFOL BIOPSY POLYPECTOMY INTESTINAL  Patient location during evaluation: PACU Anesthesia Type: General Level of consciousness: awake and alert Pain management: pain level controlled Vital Signs Assessment: post-procedure vital signs reviewed and stable Respiratory status: spontaneous breathing, nonlabored ventilation, respiratory function stable and patient connected to nasal cannula oxygen Cardiovascular status: blood pressure returned to baseline and stable Postop Assessment: no apparent nausea or vomiting Anesthetic complications: no   There were no known notable events for this encounter.   Last Vitals:  Vitals:   11/05/23 1229 11/05/23 1402  BP: (!) 171/83 102/73  Pulse: 81 92  Resp: 18 20  Temp: 36.8 C 36.5 C  SpO2: 98% 100%    Last Pain:  Vitals:   11/05/23 1402  TempSrc: Oral  PainSc: 0-No pain                 Roshad Hack L Jlee Harkless

## 2023-11-05 NOTE — Interval H&P Note (Signed)
 History and Physical Interval Note:  11/05/2023 1:27 PM  [name] Boyle  has presented today for surgery, with the diagnosis of DIARRHEA.  The various methods of treatment have been discussed with the patient and family. After consideration of risks, benefits and other options for treatment, the patient has consented to  Procedure(s) with comments: COLONOSCOPY WITH PROPOFOL (N/A) - 9:45AM;ASA 3 as a surgical intervention.  The patient's history has been reviewed, patient examined, no change in status, stable for surgery.  I have reviewed the patient's chart and labs.  Questions were answered to the patient's satisfaction.     Katrinka Blazing Mayorga

## 2023-11-05 NOTE — Transfer of Care (Signed)
 Immediate Anesthesia Transfer of Care Note  Patient: [name] Boyle  Procedure(s) Performed: COLONOSCOPY WITH PROPOFOL BIOPSY POLYPECTOMY INTESTINAL  Patient Location: PACU  Anesthesia Type:General  Level of Consciousness: awake, alert , oriented, and patient cooperative  Airway & Oxygen Therapy: Patient Spontanous Breathing  Post-op Assessment: Report given to RN, Post -op Vital signs reviewed and stable, and Patient moving all extremities X 4  Post vital signs: Reviewed and stable  Last Vitals:  Vitals Value Taken Time  BP 102/73 1404  Temp 97.7 1404  Pulse 86 1404  Resp 20 1404  SpO2 100 1404    Last Pain:  Vitals:   11/05/23 1330  PainSc: 0-No pain         Complications: No notable events documented.

## 2023-11-05 NOTE — Discharge Instructions (Addendum)
 You are being discharged to home.  Resume your previous diet.  Your physician has recommended a repeat colonoscopy for surveillance based on pathology results.  We are waiting for your pathology results.  Start taking Benefiber 1-2 tablespoons mixed with water daily

## 2023-11-06 ENCOUNTER — Encounter (HOSPITAL_COMMUNITY): Payer: Self-pay | Admitting: Gastroenterology

## 2023-11-07 LAB — SURGICAL PATHOLOGY

## 2023-11-08 ENCOUNTER — Encounter (INDEPENDENT_AMBULATORY_CARE_PROVIDER_SITE_OTHER): Payer: Self-pay | Admitting: *Deleted

## 2024-01-14 ENCOUNTER — Encounter (INDEPENDENT_AMBULATORY_CARE_PROVIDER_SITE_OTHER): Payer: Self-pay | Admitting: Gastroenterology

## 2024-02-17 ENCOUNTER — Encounter (INDEPENDENT_AMBULATORY_CARE_PROVIDER_SITE_OTHER): Payer: Self-pay | Admitting: Gastroenterology

## 2024-02-17 ENCOUNTER — Ambulatory Visit (INDEPENDENT_AMBULATORY_CARE_PROVIDER_SITE_OTHER): Admitting: Gastroenterology

## 2024-02-17 VITALS — BP 133/67 | HR 71 | Temp 97.8°F | Ht 65.0 in | Wt 186.2 lb

## 2024-02-17 DIAGNOSIS — R09A2 Foreign body sensation, throat: Secondary | ICD-10-CM | POA: Diagnosis not present

## 2024-02-17 DIAGNOSIS — R194 Change in bowel habit: Secondary | ICD-10-CM

## 2024-02-17 DIAGNOSIS — R0989 Other specified symptoms and signs involving the circulatory and respiratory systems: Secondary | ICD-10-CM

## 2024-02-17 NOTE — Progress Notes (Signed)
 Samantha Cress, M.D. Gastroenterology & Hepatology [facility] Gastroenterology [address] [place], [place] [address]  Primary Care Physician: Vanita Gens, MD (Inactive) [address] [place] [address]  I will communicate my assessment and recommendations to the referring MD via EMR.  Problems: Globus sensation on Elavil  Constant throat clearing  History of Present Illness: Gregary Blackard is a 73 y.o. male with past medical history of  DM, GERD, high cholesterol, HTN, who presents for follow up of globus sensation.  The patient was last seen on 10/07/23. At that time, the patient was continued on Elavil  25 mg at night and was scheduled for colonoscopy given changes in bowel habits.  Patient reports that he has had 80% improvement with Elavil , but still has presented constant throat clearing through the day. He has not felt any heartburn or dysphagia.Patient uses azelastine  and Nasacort regularly. He feels constantly congested in his nose when he coughs. He has not seen his allergist for a while. Patient was seen by ENT on 01/28/2024, who performed a flexible laryngoscopy which was within normal limits.  Based on his note, it was suggested that his symptoms could be related to LPR.  The patient denies having any nausea, vomiting, fever, chills, hematochezia, melena, hematemesis, abdominal distention, abdominal pain, diarrhea, jaundice, pruritus or weight loss.  He is having 2 Bms per day, occasionally has 3 Bms per day. Stools are usually solid. No longer taking Imodium or Benefiber.  -pH impedence testing done on PPI, normal suppression of acid exposure and no episodes of nonacidic reflux. There was frequent throat clearing but this was not associated to acid exposure. -Esophageal manometry was also normal.  Last EGD:03/20/22- Normal esophagus. - Gastroesophageal flap valve classified as Hill Grade II (fold present, opens with respiration). -  Normal stomach. - Normal examined duodenum. Last Colonoscopy:11/05/23 - The examined portion of the ileum was normal. - The entire examined colon is normal. Biopsied. - One 3 mm polyp in the rectum, removed with a cold snare. Resected and retrieved. - Non- bleeding internal hemorrhoids. Path: A. COLON, RANDOM, BIOPSY:  Benign colonic mucosa with no diagnostic abnormality   B. RECTUM, POLYPECTOMY:  Compatible hyperplastic polyp   Repeat colonoscopy in 10 years, if medically fit.   Past Medical History: Past Medical History:  Diagnosis Date   Diabetes mellitus without complication (HCC)    GERD (gastroesophageal reflux disease)    High cholesterol    Hypertension     Past Surgical History: Past Surgical History:  Procedure Laterality Date   BIOPSY  11/05/2023   Procedure: BIOPSY;  Surgeon: Urban Garden, MD;  [facility];  Service: Gastroenterology;;   COLONOSCOPY N/A 03/26/2019   Procedure: COLONOSCOPY;  Surgeon: Ruby Corporal, MD;  [facility];  Service: Endoscopy;  Laterality: N/A;  12:00   COLONOSCOPY WITH PROPOFOL  N/A 11/05/2023   Procedure: COLONOSCOPY WITH PROPOFOL ;  Surgeon: Urban Garden, MD;  [facility];  Service: Gastroenterology;  Laterality: N/A;  9:45AM;ASA 3   ESOPHAGOGASTRODUODENOSCOPY N/A 08/28/2018   Procedure: ESOPHAGOGASTRODUODENOSCOPY (EGD);  Surgeon: Ruby Corporal, MD;  [facility];  Service: Endoscopy;  Laterality: N/A;  2:00   ESOPHAGOGASTRODUODENOSCOPY (EGD) WITH PROPOFOL  N/A 03/20/2022   Procedure: ESOPHAGOGASTRODUODENOSCOPY (EGD) WITH PROPOFOL ;  Surgeon: Urban Garden, MD;  [facility];  Service: Gastroenterology;  Laterality: N/A;  155 ASA 2   EXPLORATORY [facility]OTOMY  07[facility]2017   [facility]     LAPAROTOMY N/A 07/21/2018   Procedure: EXPLORATORY LAPAROTOMY, SMALL BOWEL RESECTION;  Surgeon: Awilda Bogus, MD;   Location: [facility];  Service: General;  Laterality: N/A;   LYSIS OF ADHESION N/A 07/21/2018   Procedure: LYSIS OF ADHESION;  Surgeon: Awilda Bogus, MD;  Location: [facility];  Service: General;  Laterality: N/A;   POLYPECTOMY  11/05/2023   Procedure: POLYPECTOMY INTESTINAL;  Surgeon: Umberto Ganong, Bearl Limes, MD;  [facility];  Service: Gastroenterology;;    Family History: Family History  Problem Relation Age of Onset   Diabetes Father    Diabetes Maternal Grandfather     Social History: Social History   Tobacco Use  Smoking Status Former   Current packs/day: 0.00   Types: Cigarettes   Start date: 07/23/1964   Quit date: 07/23/2000   Years since quitting: 23.5   Passive exposure: Past  Smokeless Tobacco Never   Social History   Substance and Sexual Activity  Alcohol Use Yes   Comment: moderate   Social History   Substance and Sexual Activity  Drug Use Never    Allergies: No Known Allergies  Medications: Current Outpatient Medications  Medication Sig Dispense Refill   amitriptyline  (ELAVIL ) 25 MG tablet TAKE 1 TABLET BY MOUTH AT  BEDTIME 90 tablet 3   amLODipine  (NORVASC ) 10 MG tablet Take 10 mg by mouth every morning.      Ascorbic Acid (VITAMIN C) 1000 MG tablet Take 1,000 mg by mouth every morning.      aspirin  EC 81 MG tablet Take 81 mg by mouth daily.     atorvastatin  (LIPITOR) 40 MG tablet Take 40 mg by mouth every evening.      azelastine  (ASTELIN ) 0.1 % nasal spray Place 1 spray into both nostrils every morning. Use in each nostril as directed-May use in the evening as needed     B Complex-C (SUPER B COMPLEX PO) Take 1 tablet by mouth daily.     chlorthalidone (HYGROTON) 25 MG tablet Take 25 mg by mouth daily.     Cholecalciferol (VITAMIN D-3) 25 MCG (1000 UT) CAPS Take 1,000 Units by mouth daily.     clobetasol cream (TEMOVATE) 0.05 % Apply 1 application  topically daily as needed (irritation). 2-3 times per week.     Coenzyme Q10 300 MG  CAPS Take 300 mg by mouth daily.     diclofenac (VOLTAREN) 75 MG EC tablet Take 75 mg by mouth. Takes 2 daily     enalapril  (VASOTEC ) 20 MG tablet Take 20 mg by mouth 2 (two) times daily.     ferrous sulfate 325 (65 FE) MG tablet Take 325 mg by mouth daily with breakfast.     fexofenadine (ALLEGRA) 180 MG tablet Take 180 mg by mouth every morning.      fluticasone  (FLONASE ) 50 MCG/ACT nasal spray Place 1 spray into both nostrils daily.     folic acid (FOLVITE) 800 MCG tablet Take 6,400 mcg by mouth daily.     Glucosamine HCl 1500 MG TABS Take 3,000 mg by mouth daily.     hydrALAZINE  (APRESOLINE ) 10 MG tablet Take 10 mg by mouth 3 (three) times daily.     ketoconazole (NIZORAL) 2 % cream Apply 1 application  topically daily as needed for irritation. 2-3 times per week.     levothyroxine (SYNTHROID) 75 MCG tablet Take 75 mcg by mouth daily before breakfast.     metFORMIN (GLUCOPHAGE) 500 MG tablet Take 500 mg  by mouth every evening.     Multiple Vitamin (MULTIVITAMIN WITH MINERALS) TABS tablet Take 1 tablet by mouth daily.     naphazoline-pheniramine (NAPHCON-A) 0.025-0.3 % ophthalmic solution Place 1-2 drops into both eyes daily.     Omega-3 Fatty Acids (FISH OIL ULTRA) 1400 MG CAPS Take 1,400 mg by mouth daily.     Polyethyl Glycol-Propyl Glycol (SYSTANE OP) Apply to eye. Propylene glycol 0.6%     Turmeric (QC TUMERIC COMPLEX PO) Take 3,000 mg by mouth daily.     No current facility-administered medications for this visit.    Review of Systems: GENERAL: negative for malaise, night sweats HEENT: No changes in hearing or vision, no nose bleeds or other nasal problems. NECK: Negative for lumps, goiter, pain and significant neck swelling RESPIRATORY: Negative for cough, wheezing CARDIOVASCULAR: Negative for chest pain, leg swelling, palpitations, orthopnea GI: SEE HPI MUSCULOSKELETAL: Negative for joint pain or swelling, back pain, and muscle pain. SKIN: Negative for lesions, rash PSYCH:  Negative for sleep disturbance, mood disorder and recent psychosocial stressors. HEMATOLOGY Negative for prolonged bleeding, bruising easily, and swollen nodes. ENDOCRINE: Negative for cold or heat intolerance, polyuria, polydipsia and goiter. NEURO: negative for tremor, gait imbalance, syncope and seizures. The remainder of the review of systems is noncontributory.   Physical Exam: BP 133/67 (BP Location: Left Arm, Patient Position: Sitting, Cuff Size: Normal)   Pulse 71   Temp 97.8 F (36.6 C) (Temporal)   Ht 5\' 5"  (1.651 m)   Wt 186 lb 3.2 oz (84.5 kg)   BMI 30.99 kg/m  GENERAL: The patient is AO x3, in no acute distress. HEENT: Head is normocephalic and atraumatic. EOMI are intact. Mouth is well hydrated and without lesions. NECK: Supple. No masses LUNGS: Clear to auscultation. No presence of rhonchi/wheezing/rales. Adequate chest expansion HEART: RRR, normal s1 and s2. ABDOMEN: Soft, nontender, no guarding, no peritoneal signs, and nondistended. BS +. No masses. EXTREMITIES: Without any cyanosis, clubbing, rash, lesions or edema. NEUROLOGIC: AOx3, no focal motor deficit. SKIN: no jaundice, no rashes  Imaging/Labs: as above  I personally reviewed and interpreted the available labs, imaging and endoscopic files.  Impression and Plan: Deni Lefever is a 73 y.o. male with past medical history of  DM, GERD, high cholesterol, HTN, who presents for follow up of globus sensation.  Patient has presented persistent symptoms of throat clearing despite taking Elavil  compliantly.  He feels better with this medication but is still endorsing ongoing milder symptoms.  He has had negative pH impedance testing for GERD and he had normal laryngoscopy recently.  It is possible that his symptoms are related to ongoing postnasal drip or allergies, for which I encouraged him to follow closely with an allergist.  Will continue with the same doses of Elavil  and may consider increasing this in the future if  he is allergy workup is negative.  Bowel habits have improved after his last colonoscopy and he is having bowel habits similar to his baseline.  He can consider taking Benefiber if he presents recurrent diarrhea.  -Continue Elavil  25 mg every day - Patient will check with his insurance and schedule an appointment with allergist covered under his insurance plan -If presenting issues with recurring diarrhea, go back on Benefiber daily  All questions were answered.      Samantha Cress, MD Gastroenterology and Hepatology [facility] Gastroenterology

## 2024-02-17 NOTE — Patient Instructions (Addendum)
 Continue Elavil  25 mg every day Please schedule an appointment with allergist If presenting issues with recurring diarrhea, go back on Benefiber daily

## 2024-03-23 ENCOUNTER — Ambulatory Visit: Payer: Self-pay

## 2024-03-23 NOTE — Telephone Encounter (Signed)
 No triage. Pt only wanted Dr antonetta and she is not taking new pts.Pt asked for referral to allergy clinic and pt was told to follow up with his PCP. Pt had no further questions.        Copied from CRM [id number]. Topic: Clinical - Medical Advice >> Mar 23, 2024  2:33 PM Antwanette L wrote: Reason for CRM: Patient is having excessive post nasal drip. The patient is not looking to change pcp but would like to see Dr. antonetta for his allergies. Please contact the patient at [phone].

## 2024-04-02 ENCOUNTER — Ambulatory Visit: Admitting: Family Medicine

## 2024-04-22 ENCOUNTER — Ambulatory Visit (INDEPENDENT_AMBULATORY_CARE_PROVIDER_SITE_OTHER): Admitting: Allergy & Immunology

## 2024-04-22 ENCOUNTER — Other Ambulatory Visit: Payer: Self-pay

## 2024-04-22 ENCOUNTER — Encounter: Payer: Self-pay | Admitting: Allergy & Immunology

## 2024-04-22 VITALS — BP 132/70 | HR 88 | Temp 97.6°F | Resp 18 | Ht 64.17 in | Wt 185.8 lb

## 2024-04-22 DIAGNOSIS — L2089 Other atopic dermatitis: Secondary | ICD-10-CM | POA: Diagnosis not present

## 2024-04-22 DIAGNOSIS — J310 Chronic rhinitis: Secondary | ICD-10-CM

## 2024-04-22 MED ORDER — CARBINOXAMINE MALEATE 4 MG PO TABS: 4.0000 mg | ORAL_TABLET | Freq: Two times a day (BID) | ORAL | 5 refills | Status: DC

## 2024-04-22 MED ORDER — IPRATROPIUM BROMIDE 0.03 % NA SOLN
1.0000 | Freq: Three times a day (TID) | NASAL | 6 refills | Status: DC
Start: 2024-04-22 — End: 2024-05-20

## 2024-04-22 NOTE — Progress Notes (Signed)
 NEW PATIENT  Date of Service/Encounter:  04/22/24  Consult requested by: Luke Agent, MD (Inactive)   Assessment:   Chronic rhinitis - getting blood work  Eczema - treated ith over the counter ointment  Previous history of GERD - had negative testing with a pH probe  Plan/Recommendations:   1. Chronic rhinitis - Because of insurance stipulations, we cannot do skin testing on the same day as your first visit. - We are all working to fight this, but for now we need to do two separate visits.  - We will do some blood work to look for environmental allergies since we cannot do skin testing today. - Let's make an appointment for skin testing to follow up on the blood work if needed (sometimes people are negative on the blood work and positive on skin testing and vice versa). - In the meantime, start carbinoxamine  4mg  twice daily (better at DRYING up the nose). - Continue with the fluticasone  and azelastine  twice daily since this is helping with the congestion. - Start taking nasal Atrovent  (ipratropium) one spray per nostril up to three times daily (CAN BE OVER DRYING)  - STOP your antihistamines for 3 days before the next appointment in case we need to skin test.   2. Eczema - Continue with your moisturizing as you are doing.   3. Return in about 4 weeks (around 05/20/2024) for SKIN TESTING. You can have the follow up appointment with Dr. Iva or a Nurse Practicioner (our Nurse Practitioners are excellent and always have Physician oversight!).    This note in its entirety was forwarded to the Provider who requested this consultation.  Subjective:   [name] Boyle is a 73 y.o. male presenting today for evaluation of  Chief Complaint  Patient presents with   Sinusitis    Excessive sinus drainage, constantly blowing nose, and clearing his throat. Coughing up clear mucus. He has been dealing with this issue 2017.    [name] Boyle has a history of the following: Patient Active  Problem List   Diagnosis Date Noted   Throat clearing 02/17/2024   Change in bowel habits 02/17/2024   Globus sensation 05/24/2022   History of colonic polyps 02/18/2019   Intestinal adhesions with partial obstruction (HCC)    Hypertension 07/17/2018   Type 2 diabetes mellitus (HCC) 07/17/2018   Hyperlipidemia 07/17/2018   SBO (small bowel obstruction) (HCC) 07/08/2018   Gastroesophageal reflux disease without esophagitis 06/20/2018   Small bowel obstruction (HCC) 06/09/2018    History obtained from: chart review and patient.  Discussed the use of AI scribe software for clinical note transcription with the patient and/or guardian, who gave verbal consent to proceed.  [name] Boyle was referred by Luke Agent, MD (Inactive).     [name] Boyle is a 73 y.o. male presenting for an evaluation of postnasal drip and throat clearing.  Allergic Rhinitis Symptom History: In 2010, he was diagnosed with a dust mite allergy and was prescribed azelastine  and Flonase , which initially resolved his nasal congestion. However, the excessive sinus drainage began in 2017. He has been evaluated by multiple allergists and ENT specialists. A 24-hour acid reflux test in 2024 ruled out acid reflux as a cause. He has tried various medications, including Claritin and Zyrtec, without relief.  He has experienced excessive sinus drainage and postnasal drip since 2017, characterized by constant throat clearing and coughing up clear mucus. There is a persistent sensation of mucus coating at the top of his throat, affecting his vocal cords. Despite  being able to blow some mucus out of his nose, the issue remains unresolved. He continues to use azelastine  and Flonase , which help with nasal congestion but do not alleviate the sinus drainage.    He quit smoking in 2001 and has no history of asthma, pneumonias, or ear infections.   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 He has been cautious about COVID-19,  avoiding restaurants and wearing a mask in public places.   Otherwise, there is no history of other atopic diseases, including drug allergies, stinging insect allergies, or contact dermatitis. There is no significant infectious history. Vaccinations are up to date.    Past Medical History: Patient Active Problem List   Diagnosis Date Noted   Throat clearing 02/17/2024   Change in bowel habits 02/17/2024   Globus sensation 05/24/2022   History of colonic polyps 02/18/2019   Intestinal adhesions with partial obstruction (HCC)    Hypertension 07/17/2018   Type 2 diabetes mellitus (HCC) 07/17/2018   Hyperlipidemia 07/17/2018   SBO (small bowel obstruction) (HCC) 07/08/2018   Gastroesophageal reflux disease without esophagitis 06/20/2018   Small bowel obstruction (HCC) 06/09/2018    Medication List:  Allergies as of 04/22/2024   No Known Allergies      Medication List        Accurate as of April 22, 2024 10:52 AM. If you have any questions, ask your nurse or doctor.          amitriptyline  25 MG tablet Commonly known as: ELAVIL  TAKE 1 TABLET BY MOUTH AT  BEDTIME   amLODipine  10 MG tablet Commonly known as: NORVASC  Take 10 mg by mouth every morning.   aspirin  EC 81 MG tablet Take 81 mg by mouth daily.   atorvastatin  40 MG tablet Commonly known as: LIPITOR Take 40 mg by mouth every evening.   azelastine  0.1 % nasal spray Commonly known as: ASTELIN  Place 1 spray into both nostrils every morning. Use in each nostril as directed-May use in the evening as needed   Carbinoxamine  Maleate 4 MG Tabs Take 1 tablet (4 mg total) by mouth in the morning and at bedtime. Started by: Marty Morton Shaggy   chlorthalidone 25 MG tablet Commonly known as: HYGROTON Take 25 mg by mouth daily.   clobetasol cream 0.05 % Commonly known as: TEMOVATE Apply 1 application  topically daily as needed (irritation). 2-3 times per week.   Coenzyme Q10 300 MG Caps Take 300 mg by mouth  daily.   diclofenac 75 MG EC tablet Commonly known as: VOLTAREN Take 75 mg by mouth. Takes 2 daily   enalapril  20 MG tablet Commonly known as: VASOTEC  Take 20 mg by mouth 2 (two) times daily.   ferrous sulfate 325 (65 FE) MG tablet Take 325 mg by mouth daily with breakfast.   fexofenadine 180 MG tablet Commonly known as: ALLEGRA Take 180 mg by mouth every morning.   Fish Oil Ultra 1400 MG Caps Take 1,400 mg by mouth daily.   fluticasone  50 MCG/ACT nasal spray Commonly known as: FLONASE  Place 1 spray into both nostrils daily.   folic acid 800 MCG tablet Commonly known as: FOLVITE Take 6,400 mcg by mouth daily.   Glucosamine HCl 1500 MG Tabs Take 3,000 mg by mouth daily.   hydrALAZINE  10 MG tablet Commonly known as: APRESOLINE  Take 10 mg by mouth 3 (three) times daily.   ipratropium 0.03 % nasal spray Commonly known as: ATROVENT  Place 1 spray into both nostrils 3 (three) times  daily. Started by: Marty Morton Shaggy   ketoconazole 2 % cream Commonly known as: NIZORAL Apply 1 application  topically daily as needed for irritation. 2-3 times per week.   levothyroxine 75 MCG tablet Commonly known as: SYNTHROID Take 75 mcg by mouth daily before breakfast.   metFORMIN 500 MG tablet Commonly known as: GLUCOPHAGE Take 500 mg by mouth every evening.   multivitamin with minerals Tabs tablet Take 1 tablet by mouth daily.   naphazoline-pheniramine 0.025-0.3 % ophthalmic solution Commonly known as: NAPHCON-A Place 1-2 drops into both eyes daily.   QC TUMERIC COMPLEX PO Take 3,000 mg by mouth daily.   SUPER B COMPLEX PO Take 1 tablet by mouth daily.   SYSTANE OP Apply to eye. Propylene glycol 0.6%   vitamin C 1000 MG tablet Take 1,000 mg by mouth every morning.   Vitamin D-3 25 MCG (1000 UT) Caps Take 1,000 Units by mouth daily.        Birth History: non-contributory  Developmental History: non-contributory  Past Surgical History: Past Surgical  History:  Procedure Laterality Date   BIOPSY  11/05/2023   Procedure: BIOPSY;  Surgeon: Eartha Angelia Sieving, MD;  [facility]: [facility];  Service: Gastroenterology;;   COLONOSCOPY N/A 03/26/2019   Procedure: COLONOSCOPY;  Surgeon: Golda Claudis PENNER, MD;  [facility]: [facility];  Service: Endoscopy;  Laterality: N/A;  12:00   COLONOSCOPY WITH PROPOFOL  N/A 11/05/2023   Procedure: COLONOSCOPY WITH PROPOFOL ;  Surgeon: Eartha Angelia Sieving, MD;  Location: [facility];  Service: Gastroenterology;  Laterality: N/A;  9:45AM;ASA 3   ESOPHAGOGASTRODUODENOSCOPY N/A 08/28/2018   Procedure: ESOPHAGOGASTRODUODENOSCOPY (EGD);  Surgeon: Golda Claudis PENNER, MD;  [facility]: [facility];  Service: Endoscopy;  Laterality: N/A;  2:00   ESOPHAGOGASTRODUODENOSCOPY (EGD) WITH PROPOFOL  N/A 03/20/2022   Procedure: ESOPHAGOGASTRODUODENOSCOPY (EGD) WITH PROPOFOL ;  Surgeon: Eartha Angelia Sieving, MD;  [facility]: [facility];  Service: Gastroenterology;  Laterality: N/A;  155 ASA 2   EXPLORATORY LAPAROTOMY  07/2017   ? partial malrotation- [facility]    KNEE SURGERY     LAPAROTOMY N/A 07/21/2018   Procedure: EXPLORATORY LAPAROTOMY, SMALL BOWEL RESECTION;  Surgeon: Kallie Manuelita BROCKS, MD;  Location: [facility];  Service: General;  Laterality: N/A;   LYSIS OF ADHESION N/A 07/21/2018   Procedure: LYSIS OF ADHESION;  Surgeon: Kallie Manuelita BROCKS, MD;  Location: [facility];  Service: General;  Laterality: N/A;   POLYPECTOMY  11/05/2023   Procedure: POLYPECTOMY INTESTINAL;  Surgeon: Eartha Angelia Sieving, MD;  [facility]: [facility];  Service: Gastroenterology;;     Family History: Family History  Problem Relation Age of Onset   Diabetes Father    Diabetes Maternal Grandfather      Social History: Dejaun lives at home.  He lives in a house.  It is 75 years old.  There is laminate flooring throughout the home.  He has oil heating and central cooling.  There are no indoor animals.  He has wildlife outside of the  home.  There are no dust mite covers on the bedding.  There is no tobacco exposure.  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  He is not a smoker.  There is no fume, chemical, or dust exposures.  They do not like air in interstate or industrial areas.   Review of systems otherwise negative other than that mentioned in the HPI.    Objective:   Blood pressure 132/70, pulse 88, temperature 97.6 F (36.4 C), temperature  source Temporal, resp. rate 18, height 5' 4.17 (1.63 m), weight 185 lb 12.8 oz (84.3 kg), SpO2 97%. Body mass index is 31.72 kg/m.     Physical Exam Vitals reviewed.  Constitutional:      Appearance: He is well-developed.  HENT:     Head: Normocephalic and atraumatic.     Right Ear: Tympanic membrane, ear canal and external ear normal. No drainage, swelling or tenderness. Tympanic membrane is not injected, scarred, erythematous, retracted or bulging.     Left Ear: Tympanic membrane, ear canal and external ear normal. No drainage, swelling or tenderness. Tympanic membrane is not injected, scarred, erythematous, retracted or bulging.     Nose: No nasal deformity, septal deviation, mucosal edema or rhinorrhea.     Right Turbinates: Enlarged, swollen and pale.     Left Turbinates: Enlarged, swollen and pale.     Right Sinus: No maxillary sinus tenderness or frontal sinus tenderness.     Left Sinus: No maxillary sinus tenderness or frontal sinus tenderness.     Mouth/Throat:     Mouth: Mucous membranes are not pale and not dry.     Pharynx: Uvula midline.  Eyes:     General:        Right eye: No discharge.        Left eye: No discharge.     Conjunctiva/sclera: Conjunctivae normal.     Right eye: Right conjunctiva is not injected. No chemosis.    Left eye: Left conjunctiva is not injected. No chemosis.    Pupils: Pupils are equal, round, and reactive to light.  Cardiovascular:     Rate and Rhythm: Normal rate and regular rhythm.     Heart sounds:  Normal heart sounds.  Pulmonary:     Effort: Pulmonary effort is normal. No tachypnea, accessory muscle usage or respiratory distress.     Breath sounds: Normal breath sounds. No wheezing, rhonchi or rales.  Chest:     Chest wall: No tenderness.  Abdominal:     Tenderness: There is no abdominal tenderness. There is no guarding or rebound.  Lymphadenopathy:     Head:     Right side of head: No submandibular, tonsillar or occipital adenopathy.     Left side of head: No submandibular, tonsillar or occipital adenopathy.     Cervical: No cervical adenopathy.  Skin:    Coloration: Skin is not pale.     Findings: No abrasion, erythema, petechiae or rash. Rash is not papular, urticarial or vesicular.  Neurological:     Mental Status: He is alert.  Psychiatric:        Behavior: Behavior is cooperative.      Diagnostic studies: deferred due to insurance stipulations that require a separate visit for testing, but I did get blood work since he was very adamant about getting testing done today.          Marty Shaggy, MD [facility] 

## 2024-04-22 NOTE — Patient Instructions (Addendum)
 1. Chronic rhinitis - Because of insurance stipulations, we cannot do skin testing on the same day as your first visit. - We are all working to fight this, but for now we need to do two separate visits.  - We will do some blood work to look for environmental allergies since we cannot do skin testing today. - Let's make an appointment for skin testing to follow up on the blood work if needed (sometimes people are negative on the blood work and positive on skin testing and vice versa). - In the meantime, start carbinoxamine  4mg  twice daily (better at DRYING up the nose). - Continue with the fluticasone  and azelastine  twice daily since this is helping with the congestion. - Start taking nasal Atrovent  (ipratropium) one spray per nostril up to three times daily (CAN BE OVER DRYING)  - STOP your antihistamines for 3 days before the next appointment in case we need to skin test.   2. Eczema - Continue with your moisturizing as you are doing.   3. Return in about 4 weeks (around 05/20/2024) for SKIN TESTING. You can have the follow up appointment with Dr. Iva or a Nurse Practicioner (our Nurse Practitioners are excellent and always have Physician oversight!).    Please inform us  of any Emergency Department visits, hospitalizations, or changes in symptoms. Call us  before going to the ED for breathing or allergy symptoms since we might be able to fit you in for a sick visit. Feel free to contact us  anytime with any questions, problems, or concerns.  It was a pleasure to meet you today!  Websites that have reliable patient information: 1. American Academy of Asthma, Allergy, and Immunology: www.aaaai.org 2. Food Allergy Research and Education (FARE): foodallergy.org 3. Mothers of Asthmatics: http://www.asthmacommunitynetwork.org 4. American College of Allergy, Asthma, and Immunology: www.acaai.org      "Like" us  on Facebook and Instagram for our latest updates!      A healthy democracy  works best when [employer] participate! Make sure you are registered to vote! If you have moved or changed any of your contact information, you will need to get this updated before voting! Scan the QR codes below to learn more!

## 2024-04-23 ENCOUNTER — Other Ambulatory Visit (INDEPENDENT_AMBULATORY_CARE_PROVIDER_SITE_OTHER): Payer: Self-pay | Admitting: Gastroenterology

## 2024-04-25 LAB — ALLERGENS W/COMP RFLX AREA 2
Alternaria Alternata IgE: <0.1 kU/L
Aspergillus Fumigatus IgE: <0.1 kU/L
Bermuda Grass IgE: <0.1 kU/L
Cedar, Mountain IgE: <0.1 kU/L
Cladosporium Herbarum IgE: <0.1 kU/L
Cockroach, German IgE: <0.1 kU/L
Common Silver Birch IgE: <0.1 kU/L
Cottonwood IgE: <0.1 kU/L
D Farinae IgE: <0.1 kU/L
D Pteronyssinus IgE: <0.1 kU/L
E001-IgE Cat Dander: <0.1 kU/L
E005-IgE Dog Dander: <0.1 kU/L
Elm, American IgE: <0.1 kU/L
IgE (Immunoglobulin E), Serum: 11 [IU]/mL (ref 6–495)
Johnson Grass IgE: <0.1 kU/L
Maple/Box Elder IgE: <0.1 kU/L
Mouse Urine IgE: <0.1 kU/L
Oak, White IgE: <0.1 kU/L
Pecan, Hickory IgE: <0.1 kU/L
Penicillium Chrysogen IgE: <0.1 kU/L
Pigweed, Rough IgE: <0.1 kU/L
Ragweed, Short IgE: <0.1 kU/L
Sheep Sorrel IgE Qn: <0.1 kU/L
Timothy Grass IgE: <0.1 kU/L
White Mulberry IgE: <0.1 kU/L

## 2024-04-29 ENCOUNTER — Ambulatory Visit: Payer: Self-pay | Admitting: Allergy & Immunology

## 2024-05-20 ENCOUNTER — Encounter: Payer: Self-pay | Admitting: Allergy & Immunology

## 2024-05-20 ENCOUNTER — Ambulatory Visit (INDEPENDENT_AMBULATORY_CARE_PROVIDER_SITE_OTHER): Admitting: Allergy & Immunology

## 2024-05-20 DIAGNOSIS — J310 Chronic rhinitis: Secondary | ICD-10-CM

## 2024-05-20 MED ORDER — IPRATROPIUM BROMIDE 0.06 % NA SOLN: 2.0000 | Freq: Three times a day (TID) | NASAL | 5 refills | Status: DC

## 2024-05-20 NOTE — Progress Notes (Signed)
 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 Chronic rhinitis - Testing today showed: grasses, ragweed, and outdoor molds - Copy of test results provided.  - Avoidance measures provided. - However, these do not explain YEAR ROUND symptoms since they are seasonal allergens. - This may be what is known as local allergic rhinitis, which occurs when your immune system makes localized IgE (allergy  antibodies) to various allergens (as opposed to systemic) IgE - Therefore, since IgE is not make systemically, allergic sensitizations are not picked up on routine skin testing. - There are specialized academic research centers that can test for IgE in the nasal cavity, but this is not done in the clinical setting at this time.  - It can still be treated with nasal sprays and antihistamines.  - Continue with carbinoxamine  4mg  twice daily (better at DRYING up the nose). - Continue with the fluticasone  and azelastine  twice daily since this is helping with the congestion. - Continue with Atrovent  (ipratropium) one spray per nostril up to three times daily (CAN BE OVER DRYING)  - We are sending in a strong Atrovent  script.  - You can use an extra dose of the antihistamine, if needed, for breakthrough symptoms.  - Consider nasal saline rinses 1-2 times daily to remove allergens from the nasal cavities as well as help with mucous clearance (this is especially helpful to do before the nasal sprays are given)  2. Eczema - Continue with your moisturizing as you are doing.   3. Return in about 6 months (around 11/17/2024). You can have the follow up appointment with Dr. Iva or a Nurse Practicioner (our Nurse Practitioners are excellent and always have Physician oversight!).      Subjective:   [name] Boyle is a 73 y.o. male presenting today for follow up of No  chief complaint on file.   [name] Boyle has a history of the following: Patient Active Problem List   Diagnosis Date Noted   Throat clearing 02/17/2024   Change in bowel habits 02/17/2024   Globus sensation 05/24/2022   History of colonic polyps 02/18/2019   Intestinal adhesions with partial obstruction (HCC)    Hypertension 07/17/2018   Type 2 diabetes mellitus (HCC) 07/17/2018   Hyperlipidemia 07/17/2018   SBO (small bowel obstruction) (HCC) 07/08/2018   Gastroesophageal reflux disease without esophagitis 06/20/2018   Small bowel obstruction (HCC) 06/09/2018    History obtained from: chart review and patient.  Discussed the use of AI scribe software for clinical note transcription with the patient and/or guardian, who gave verbal consent to proceed.  [name] Boyle is a 73 y.o. male presenting for skin testing. He was last seen on August 13th. We could not do testing because his insurance company does not cover testing on the same day as a New Patient visit. He has been off of all antihistamines 3 days in anticipation of the testing.   He reports that he is doing better with the medications.  He estimates that he is 80% improved but the nose spray which he is using 3-4 times a day as well as the antihistamine.  Otherwise, there have been no changes to his past medical history, surgical history, family history, or social history.    Review of systems otherwise negative other than that mentioned in the HPI.    Objective:   There were no vitals taken for  this visit. There is no height or weight on file to calculate BMI.    Physical exam deferred since this was a skin testing appointment only.   Diagnostic studies:   Allergy  Studies:     Airborne Adult Perc - 05/20/24 0900     Time Antigen Placed 0900          Intradermal - 05/20/24 0900     Time Antigen Placed 0901    Allergen Manufacturer Jestine    Location Arm    Number of Test 16    Control Negative    [place]  Negative    [place] Negative    [place] 1+    7 Grass 1+    Ragweed Mix 1+    Weed Mix Negative    Tree Mix Negative    Mold 1 1+    Mold 2 Negative    Mold 3 Negative    Mold 4 Negative    Mite Mix Negative    Cat Negative    Dog Negative    Cockroach Negative          Allergy  testing results were read and interpreted by myself, documented by clinical staff.      Marty Shaggy, MD  [facility] 

## 2024-05-20 NOTE — Patient Instructions (Addendum)
 1. Chronic rhinitis - Testing today showed: grasses, ragweed, and outdoor molds - Copy of test results provided.  - Avoidance measures provided. - However, these do not explain YEAR ROUND symptoms since they are seasonal allergens. - This may be what is known as local allergic rhinitis, which occurs when your immune system makes localized IgE (allergy  antibodies) to various allergens (as opposed to systemic) IgE - Therefore, since IgE is not make systemically, allergic sensitizations are not picked up on routine skin testing. - There are specialized academic research centers that can test for IgE in the nasal cavity, but this is not done in the clinical setting at this time.  - It can still be treated with nasal sprays and antihistamines.  - Continue with carbinoxamine  4mg  twice daily (better at DRYING up the nose). - Continue with the fluticasone  and azelastine  twice daily since this is helping with the congestion. - Continue with Atrovent  (ipratropium) one spray per nostril up to three times daily (CAN BE OVER DRYING)  - We are sending in a strong Atrovent  script.  - You can use an extra dose of the antihistamine, if needed, for breakthrough symptoms.  - Consider nasal saline rinses 1-2 times daily to remove allergens from the nasal cavities as well as help with mucous clearance (this is especially helpful to do before the nasal sprays are given)  2. Eczema - Continue with your moisturizing as you are doing.   3. Return in about 6 months (around 11/17/2024). You can have the follow up appointment with Dr. Iva or a Nurse Practicioner (our Nurse Practitioners are excellent and always have Physician oversight!).    Please inform us  of any Emergency Department visits, hospitalizations, or changes in symptoms. Call us  before going to the ED for breathing or allergy  symptoms since we might be able to fit you in for a sick visit. Feel free to contact us  anytime with any questions, problems,  or concerns.  It was a pleasure to see you again today!  Websites that have reliable patient information: 1. American Academy of Asthma, Allergy , and Immunology: www.aaaai.org 2. Food Allergy  Research and Education (FARE): foodallergy.org 3. Mothers of Asthmatics: http://www.asthmacommunitynetwork.org 4. American College of Allergy , Asthma, and Immunology: www.acaai.org      "Like" us  on Facebook and Instagram for our latest updates!      A healthy democracy works best when [employer] participate! Make sure you are registered to vote! If you have moved or changed any of your contact information, you will need to get this updated before voting! Scan the QR codes below to learn more!        Airborne Adult Perc - 05/20/24 0900     Time Antigen Placed 0900          Intradermal - 05/20/24 0900     Time Antigen Placed 0901    Allergen Manufacturer Jestine    Location Arm    Number of Test 16    Control Negative    [place] Negative    [place] Negative    [place] 1+    7 Grass 1+    Ragweed Mix 1+    Weed Mix Negative    Tree Mix Negative    Mold 1 1+    Mold 2 Negative    Mold 3 Negative    Mold 4 Negative    Mite Mix Negative    Cat Negative    Dog Negative    Cockroach Negative  Reducing Pollen Exposure  The American Academy of Allergy , Asthma and Immunology suggests the following steps to reduce your exposure to pollen during allergy  seasons.    Do not hang sheets or clothing out to dry; pollen may collect on these items. Do not mow lawns or spend time around freshly cut grass; mowing stirs up pollen. Keep windows closed at night.  Keep car windows closed while driving. Minimize morning activities outdoors, a time when pollen counts are usually at their highest. Stay indoors as much as possible when pollen counts or humidity is high and on windy days when pollen tends to remain in the air longer. Use air conditioning when possible.  Many air  conditioners have filters that trap the pollen spores. Use a HEPA room air filter to remove pollen form the indoor air you breathe.   Control of Mold Allergen   Mold and fungi can grow on a variety of surfaces provided certain temperature and moisture conditions exist.  Outdoor molds grow on plants, decaying vegetation and soil.  The major outdoor mold, Alternaria and Cladosporium, are found in very high numbers during hot and dry conditions.  Generally, a late Summer - Fall peak is seen for common outdoor fungal spores.  Rain will temporarily lower outdoor mold spore count, but counts rise rapidly when the rainy period ends.  The most important indoor molds are Aspergillus and Penicillium.  Dark, humid and poorly ventilated basements are ideal sites for mold growth.  The next most common sites of mold growth are the bathroom and the kitchen.  Outdoor (Seasonal) Mold Control  Positive outdoor molds via skin testing: Alternaria and Cladosporium  Use air conditioning and keep windows closed Avoid exposure to decaying vegetation. Avoid leaf raking. Avoid grain handling. Consider wearing a face mask if working in moldy areas.

## 2024-06-19 ENCOUNTER — Ambulatory Visit: Admitting: Family Medicine

## 2024-06-24 ENCOUNTER — Encounter (INDEPENDENT_AMBULATORY_CARE_PROVIDER_SITE_OTHER): Payer: Self-pay | Admitting: Gastroenterology

## 2024-08-26 ENCOUNTER — Other Ambulatory Visit: Payer: Self-pay | Admitting: Allergy & Immunology

## 2024-09-09 ENCOUNTER — Other Ambulatory Visit: Payer: Self-pay | Admitting: Allergy & Immunology

## 2024-10-07 ENCOUNTER — Other Ambulatory Visit: Payer: Self-pay | Admitting: Allergy & Immunology

## 2024-12-04 ENCOUNTER — Ambulatory Visit: Admitting: Allergy & Immunology

## 2025-04-07 ENCOUNTER — Ambulatory Visit: Admitting: Allergy & Immunology
